# Patient Record
Sex: Female | Born: 1985 | Hispanic: Yes | Marital: Married | Smoking: Never smoker
Health system: Southern US, Community
[De-identification: ages and names within clinical notes are randomized; demographics above are authoritative.]

## PROBLEM LIST (undated history)

## (undated) DIAGNOSIS — E669 Obesity, unspecified: Secondary | ICD-10-CM

## (undated) DIAGNOSIS — I2542 Coronary artery dissection: Secondary | ICD-10-CM

## (undated) DIAGNOSIS — F32A Depression, unspecified: Secondary | ICD-10-CM

## (undated) DIAGNOSIS — F329 Major depressive disorder, single episode, unspecified: Secondary | ICD-10-CM

## (undated) DIAGNOSIS — I219 Acute myocardial infarction, unspecified: Secondary | ICD-10-CM

## (undated) DIAGNOSIS — K760 Fatty (change of) liver, not elsewhere classified: Secondary | ICD-10-CM

## (undated) DIAGNOSIS — E282 Polycystic ovarian syndrome: Secondary | ICD-10-CM

## (undated) DIAGNOSIS — E785 Hyperlipidemia, unspecified: Secondary | ICD-10-CM

## (undated) DIAGNOSIS — F411 Generalized anxiety disorder: Secondary | ICD-10-CM

## (undated) DIAGNOSIS — O1490 Unspecified pre-eclampsia, unspecified trimester: Secondary | ICD-10-CM

## (undated) DIAGNOSIS — Z8679 Personal history of other diseases of the circulatory system: Secondary | ICD-10-CM

## (undated) DIAGNOSIS — Z8759 Personal history of other complications of pregnancy, childbirth and the puerperium: Secondary | ICD-10-CM

## (undated) HISTORY — DX: Generalized anxiety disorder: F41.1

## (undated) HISTORY — PX: CHOLECYSTECTOMY: SHX55

## (undated) HISTORY — DX: Coronary artery dissection: I25.42

## (undated) HISTORY — DX: Hyperlipidemia, unspecified: E78.5

## (undated) HISTORY — DX: Fatty (change of) liver, not elsewhere classified: K76.0

## (undated) HISTORY — DX: Major depressive disorder, single episode, unspecified: F32.9

## (undated) HISTORY — PX: OTHER SURGICAL HISTORY: SHX169

## (undated) HISTORY — DX: Polycystic ovarian syndrome: E28.2

## (undated) HISTORY — DX: Obesity, unspecified: E66.9

## (undated) HISTORY — DX: Unspecified pre-eclampsia, unspecified trimester: O14.90

## (undated) HISTORY — DX: Acute myocardial infarction, unspecified: I21.9

## (undated) HISTORY — DX: Depression, unspecified: F32.A

---

## 2011-03-17 DIAGNOSIS — I219 Acute myocardial infarction, unspecified: Secondary | ICD-10-CM

## 2011-03-17 HISTORY — DX: Acute myocardial infarction, unspecified: I21.9

## 2011-03-17 HISTORY — PX: OTHER SURGICAL HISTORY: SHX169

## 2013-06-08 ENCOUNTER — Encounter: Payer: Self-pay | Admitting: Emergency Medicine

## 2013-06-08 ENCOUNTER — Emergency Department
Admission: EM | Admit: 2013-06-08 | Discharge: 2013-06-08 | Disposition: A | Discharge status: Other Acute Inpatient Hospital | Payer: BC Managed Care – PPO | Source: Home / Self Care | Attending: Family Medicine | Admitting: Family Medicine

## 2013-06-08 DIAGNOSIS — R1110 Vomiting, unspecified: Secondary | ICD-10-CM

## 2013-06-08 DIAGNOSIS — R9431 Abnormal electrocardiogram [ECG] [EKG]: Secondary | ICD-10-CM

## 2013-06-08 DIAGNOSIS — Z8679 Personal history of other diseases of the circulatory system: Secondary | ICD-10-CM

## 2013-06-08 DIAGNOSIS — R112 Nausea with vomiting, unspecified: Secondary | ICD-10-CM

## 2013-06-08 LAB — POCT INFLUENZA A/B
Influenza A, POC: NEGATIVE
Influenza B, POC: NEGATIVE

## 2013-06-08 NOTE — ED Notes (Signed)
Tuesday night started vomiting, chills, fever, headache

## 2013-06-08 NOTE — ED Provider Notes (Signed)
CSN: [id number]     Arrival date & time 06/08/13  1212 History   First MD Initiated Contact with Patient 06/08/13 1213     Chief Complaint  Patient presents with  . 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  On ASA daily  Sxs not similar to previous arterial dissection, which were stabbing chest pain with radiation to the back.  States that nausea is predominantly in am.  Emesis NBNB.  Was previously followed by cardiology in [place].  Has been in [place] for 2 years. Has not been seen by cards. No primary care.   History reviewed. No pertinent past medical history. Past Surgical History  Procedure Laterality Date  . Arterial disection    . Bare metal stent    . Cesarean section     No family history on file. History  Substance Use Topics  . Smoking status: Never Smoker   . Smokeless tobacco: Not on file  . Alcohol Use: No   OB History   Grav Para Term Preterm Abortions TAB SAB Ect Mult Living                 Review of Systems  All other systems reviewed and are negative.    Allergies  Review of patient's allergies indicates no known allergies.  Home Medications   Current Outpatient Rx  Name  Route  Sig  Dispense  Refill  . aspirin 81 MG tablet   Oral   Take 81 mg by mouth daily.          BP 134/93  Pulse 110  Temp(Src) 98.9 F (37.2 C) (Oral)  Ht 5\' 3"  (1.6 m)  Wt 224 lb (101.606 kg)  BMI 39.69 kg/m2  SpO2 97% Physical Exam  Constitutional: She is oriented to person, place, and time. She appears well-developed and well-nourished.  HENT:  Head: Normocephalic and atraumatic.  Right Ear: External ear normal.  Left Ear: External ear normal.  Eyes: Conjunctivae are normal. Pupils are equal, round, and reactive to light.  Neck: Normal range of motion.  Cardiovascular: Normal  rate and regular rhythm.   Pulmonary/Chest: Effort normal and breath sounds normal.  Abdominal: Soft.  Musculoskeletal: Normal range of motion.  Neurological: She is alert and oriented to person, place, and time.  Skin: Skin is warm.    ED Course  Procedures (including critical care time) Labs Review Labs Reviewed  POCT INFLUENZA A/B   Imaging Review No results found.  EKG: sinus tachycardia. Noted t wave inversions in anterior leads  MDM   1. Emesis   2. Nausea with vomiting   3. Nonspecific abnormal electrocardiogram (ECG) (EKG)   4. H/O arterial dissection    Suspect this is a viral process. However, given prior hx/o coronary arterial dissection with recurring nausea and abnormal EKG, it may be prudent for pt to be formally evaluated to rule out coronary source of sxs. I do not have an old EKG to compare with current. Distribution of abnormality is presumptively over previous coronary arterial dissection if this was on LAD.  Pt given full dose ASA and supplemental O2 Rapid flu negative  Will send pt to ER for further evaluation of sxs.  Discussed overall plan of care with pt and she is agreeable.  Shanda Howells, MD 06/08/13 1315

## 2013-06-08 NOTE — ED Notes (Signed)
Given 4/81mg  asprins 2liters oxygen Transported to hospital by EMS

## 2013-06-09 ENCOUNTER — Encounter: Payer: Self-pay | Admitting: Physician Assistant

## 2013-06-09 ENCOUNTER — Ambulatory Visit (INDEPENDENT_AMBULATORY_CARE_PROVIDER_SITE_OTHER): Payer: BC Managed Care – PPO | Admitting: Physician Assistant

## 2013-06-09 ENCOUNTER — Telehealth: Payer: Self-pay | Admitting: Physician Assistant

## 2013-06-09 VITALS — BP 129/79 | HR 104 | Ht 63.0 in | Wt 227.0 lb

## 2013-06-09 DIAGNOSIS — N390 Urinary tract infection, site not specified: Secondary | ICD-10-CM

## 2013-06-09 DIAGNOSIS — F3289 Other specified depressive episodes: Secondary | ICD-10-CM

## 2013-06-09 DIAGNOSIS — E876 Hypokalemia: Secondary | ICD-10-CM

## 2013-06-09 DIAGNOSIS — R748 Abnormal levels of other serum enzymes: Secondary | ICD-10-CM

## 2013-06-09 DIAGNOSIS — F411 Generalized anxiety disorder: Secondary | ICD-10-CM

## 2013-06-09 DIAGNOSIS — F32A Depression, unspecified: Secondary | ICD-10-CM

## 2013-06-09 DIAGNOSIS — F329 Major depressive disorder, single episode, unspecified: Secondary | ICD-10-CM

## 2013-06-09 LAB — HEPATIC FUNCTION PANEL
ALBUMIN: 3.7 g/dL (ref 3.5–5.2)
ALT: 69 U/L — AB (ref 0–35)
AST: 50 U/L — ABNORMAL HIGH (ref 0–37)
Alkaline Phosphatase: 69 U/L (ref 39–117)
Bilirubin, Direct: 0.5 mg/dL — ABNORMAL HIGH (ref 0.0–0.3)
Indirect Bilirubin: 1.6 mg/dL — ABNORMAL HIGH (ref 0.2–1.2)
TOTAL PROTEIN: 6.7 g/dL (ref 6.0–8.3)
Total Bilirubin: 2.1 mg/dL — ABNORMAL HIGH (ref 0.2–1.2)

## 2013-06-09 LAB — POTASSIUM: POTASSIUM: 3.5 meq/L (ref 3.5–5.3)

## 2013-06-09 MED ORDER — BUPROPION HCL ER (XL) 150 MG PO TB24: 150.0000 mg | ORAL_TABLET | Freq: Every day | ORAL | Status: DC

## 2013-06-09 NOTE — Telephone Encounter (Signed)
Done

## 2013-06-09 NOTE — Telephone Encounter (Signed)
Patient was just seen and need her pharmacy changed to [facility] on [address] not [facility] they do not accept her insurance. Thanks

## 2013-06-09 NOTE — Patient Instructions (Signed)
Will start wellbutrin daily.    Depression, Adult Depression refers to feeling sad, low, down in the dumps, blue, gloomy, or empty. In general, there are two kinds of depression: 1. Depression that we all experience from time to time because of upsetting life experiences, including the loss of a job or the ending of a relationship (normal sadness or normal grief). This kind of depression is considered normal, is short lived, and resolves within a few days to 2 weeks. (Depression experienced after the loss of a loved one is called bereavement. Bereavement often lasts longer than 2 weeks but normally gets better with time.) 2. Clinical depression, which lasts longer than normal sadness or normal grief or interferes with your ability to function at home, at work, and in school. It also interferes with your personal relationships. It affects almost every aspect of your life. Clinical depression is an illness. Symptoms of depression also can be caused by conditions other than normal sadness and grief or clinical depression. Examples of these conditions are listed as follows:  Physical illness Some physical illnesses, including underactive thyroid gland (hypothyroidism), severe anemia, specific types of cancer, diabetes, uncontrolled seizures, heart and lung problems, strokes, and chronic pain are commonly associated with symptoms of depression.  Side effects of some prescription medicine In some people, certain types of prescription medicine can cause symptoms of depression.  Substance abuse Abuse of alcohol and illicit drugs can cause symptoms of depression. SYMPTOMS Symptoms of normal sadness and normal grief include the following:  Feeling sad or crying for short periods of time.  Not caring about anything (apathy).  Difficulty sleeping or sleeping too much.  No longer able to enjoy the things you used to enjoy.  Desire to be by oneself all the time (social isolation).  Lack of energy or  motivation.  Difficulty concentrating or remembering.  Change in appetite or weight.  Restlessness or agitation. Symptoms of clinical depression include the same symptoms of normal sadness or normal grief and also the following symptoms:  Feeling sad or crying all the time.  Feelings of guilt or worthlessness.  Feelings of hopelessness or helplessness.  Thoughts of suicide or the desire to harm yourself (suicidal ideation).  Loss of touch with reality (psychotic symptoms). Seeing or hearing things that are not real (hallucinations) or having false beliefs about your life or the people around you (delusions and paranoia). DIAGNOSIS  The diagnosis of clinical depression usually is based on the severity and duration of the symptoms. Your caregiver also will ask you questions about your medical history and substance use to find out if physical illness, use of prescription medicine, or substance abuse is causing your depression. Your caregiver also may order blood tests. TREATMENT  Typically, normal sadness and normal grief do not require treatment. However, sometimes antidepressant medicine is prescribed for bereavement to ease the depressive symptoms until they resolve. The treatment for clinical depression depends on the severity of your symptoms but typically includes antidepressant medicine, counseling with a mental health professional, or a combination of both. Your caregiver will help to determine what treatment is best for you. Depression caused by physical illness usually goes away with appropriate medical treatment of the illness. If prescription medicine is causing depression, talk with your caregiver about stopping the medicine, decreasing the dose, or substituting another medicine. Depression caused by abuse of alcohol or illicit drugs abuse goes away with abstinence from these substances. Some adults need professional help in order to stop drinking or using drugs.  SEEK IMMEDIATE  CARE IF:  You have thoughts about hurting yourself or others.  You lose touch with reality (have psychotic symptoms).  You are taking medicine for depression and have a serious side effect. FOR MORE INFORMATION National Alliance on Mental Illness: www.nami.[employer]: https://carter.com/ Document Released: 02/28/2000 Document Revised: 09/01/2011 Document Reviewed: 06/01/2011 [facility] Patient Information 2014 [place].

## 2013-06-13 ENCOUNTER — Other Ambulatory Visit: Payer: Self-pay | Admitting: Physician Assistant

## 2013-06-13 DIAGNOSIS — O99345 Other mental disorders complicating the puerperium: Secondary | ICD-10-CM | POA: Insufficient documentation

## 2013-06-13 DIAGNOSIS — F329 Major depressive disorder, single episode, unspecified: Secondary | ICD-10-CM | POA: Insufficient documentation

## 2013-06-13 DIAGNOSIS — F53 Postpartum depression: Secondary | ICD-10-CM | POA: Insufficient documentation

## 2013-06-13 DIAGNOSIS — F32A Depression, unspecified: Secondary | ICD-10-CM | POA: Insufficient documentation

## 2013-06-13 DIAGNOSIS — F411 Generalized anxiety disorder: Secondary | ICD-10-CM | POA: Insufficient documentation

## 2013-06-13 DIAGNOSIS — R748 Abnormal levels of other serum enzymes: Secondary | ICD-10-CM

## 2013-06-13 NOTE — Progress Notes (Addendum)
   Subjective:    Patient ID: Carol Wright, female    DOB: 08-23-1985, 28 y.o.   MRN: [id number]  HPI. Pt is a 28 yo female who presents to the clinic to establish care. She is currently being treated for a UTI that she she went to hospital for on 06/08/13 after going to [facility]. EKG had some abnormalities and was sent to ER. She started Cephalaxin, phenergan, and norco and has started to feel much better. She was having a lot of back pain, nausea, and vomiting before treatment. Potassium was a little low and liver enzymes were mildly elevated.   She comes in to discuss ongoing anxiety and depression today. She feels very out of control. She is sleeping alot. She will go to bed at 8 but wake up at in the morning. She has no energy. She does not feel like she wants to get out of bed. She doesn't want to do laundry or any activities. She stays at home with her daughter all day. She is constantly worring about things. Denies any suicidal or homicidal thoughts. Tried antidepressants in the past but doesn't remember any progress but may not have stayed on long enough.   . Active Ambulatory Problems    Diagnosis Date Noted  . No Active Ambulatory Problems   Resolved Ambulatory Problems    Diagnosis Date Noted  . No Resolved Ambulatory Problems   Past Medical History  Diagnosis Date  . Myocardial infarction    . History   Social History  . Marital Status: Married    Spouse Name: N/A    Number of Children: N/A  . Years of Education: N/A   Occupational History  . Not on file.   Social History Main Topics  . Smoking status: Never Smoker   . Smokeless tobacco: Not on file  . Alcohol Use: No  . Drug Use: No  . Sexual Activity: Yes   Other Topics Concern  . Not on file   Social History Narrative  . No narrative on file   . Family History  Problem Relation Age of Onset  . Hypertension Mother   . Hypertension Maternal Grandmother   . Heart attack Maternal Grandfather       Review  of Systems  All other systems reviewed and are negative.       Objective:   Physical Exam  Constitutional: She is oriented to person, place, and time. She appears well-developed and well-nourished.  HENT:  Head: Normocephalic and atraumatic.  Right Ear: External ear normal.  Left Ear: External ear normal.  Nose: Nose normal.  Mouth/Throat: Oropharynx is clear and moist.  Eyes: Conjunctivae are normal.  Cardiovascular: Normal rate, regular rhythm and normal heart sounds.   Pulmonary/Chest: Effort normal and breath sounds normal. She has no wheezes.  No CVA tenderness.  Neurological: She is alert and oriented to person, place, and time.  Skin: Skin is dry.  Psychiatric:  Flat affect.          Assessment & Plan:  UTI- seems to be improving. Stay on keflex. Follow up in not improving. If not in pain do not use norco.  Low potassium- will recheck today.   Elevated liver enzymes- will recheck today.   Anxiety and depression- GAD-7 was 16 and PHQ-9 was 22. Would like to start treatment with wellbutrin. Discussed side effects and possibility of worsening depression/anxiety stop and call office. Follow up in 4-6 weeks.

## 2013-06-19 ENCOUNTER — Ambulatory Visit (INDEPENDENT_AMBULATORY_CARE_PROVIDER_SITE_OTHER): Payer: BC Managed Care – PPO

## 2013-06-19 DIAGNOSIS — R748 Abnormal levels of other serum enzymes: Secondary | ICD-10-CM

## 2013-06-20 ENCOUNTER — Encounter: Payer: Self-pay | Admitting: Physician Assistant

## 2013-06-20 ENCOUNTER — Encounter: Payer: Self-pay | Admitting: Nurse Practitioner

## 2013-06-20 ENCOUNTER — Ambulatory Visit (INDEPENDENT_AMBULATORY_CARE_PROVIDER_SITE_OTHER): Payer: BC Managed Care – PPO | Admitting: Nurse Practitioner

## 2013-06-20 VITALS — BP 133/94 | HR 94 | Resp 16 | Ht 63.0 in | Wt 224.0 lb

## 2013-06-20 DIAGNOSIS — Z1151 Encounter for screening for human papillomavirus (HPV): Secondary | ICD-10-CM

## 2013-06-20 DIAGNOSIS — Z0000 Encounter for general adult medical examination without abnormal findings: Secondary | ICD-10-CM

## 2013-06-20 DIAGNOSIS — I252 Old myocardial infarction: Secondary | ICD-10-CM

## 2013-06-20 DIAGNOSIS — Z124 Encounter for screening for malignant neoplasm of cervix: Secondary | ICD-10-CM

## 2013-06-20 DIAGNOSIS — Z01419 Encounter for gynecological examination (general) (routine) without abnormal findings: Secondary | ICD-10-CM

## 2013-06-20 DIAGNOSIS — K760 Fatty (change of) liver, not elsewhere classified: Secondary | ICD-10-CM | POA: Insufficient documentation

## 2013-06-20 DIAGNOSIS — E669 Obesity, unspecified: Secondary | ICD-10-CM | POA: Insufficient documentation

## 2013-06-20 LAB — HEPATITIS PANEL, ACUTE
HCV AB: NEGATIVE
Hep A IgM: NONREACTIVE
Hep B C IgM: NONREACTIVE
Hepatitis B Surface Ag: NEGATIVE

## 2013-06-20 NOTE — Progress Notes (Signed)
History:  Carol Wright.is a 28 y.o. G1P0101 who presents to [facility] clinic today for well woman exam. She has not had pap smear in 2 years, no abnormal's. She denies any issues with vagina and has same partner. She moved here from [place] and is just establishing care. She is using nothing for contraception and she is actively planning pregnancy. She has a history of cardiac dissection leading to MI when she was 5 months pregnant. She is unsure of vessel. She also had PIH with that pregnancy. She was recently placed on Wellbutrin for anxiety and depression.   The following portions of the patient's history were reviewed and updated as appropriate: allergies, current medications, past family history, past medical history, past social history, past surgical history and problem list.  Review of Systems:  Pertinent items are noted in HPI.  Objective:  Physical Exam BP 133/94  Pulse 94  Resp 16  Ht 5\' 3"  (1.6 m)  Wt 224 lb (101.606 kg)  BMI 39.69 kg/m2  LMP 06/11/2013 GENERAL: Well-developed, well-nourished female in no acute distress. Obese HEENT: Normocephalic, atraumatic.  NECK: Supple. Normal thyroid.  LUNGS: Normal rate. Clear to auscultation bilaterally.  HEART: Regular rate and rhythm with no adventitious sounds.  BREASTS: Symmetric in size. No masses, skin changes, nipple drainage, or lymphadenopathy. Second nipple on right breast ABDOMEN: Soft, nontender, nondistended. No organomegaly. Normal bowel sounds appreciated in all quadrants.  PELVIC: Normal external female genitalia. Vagina is pink and rugated.  Normal discharge. Normal cervix contour. Pap smear obtained. Uterus is normal in size. No adnexal mass or tenderness. Difficult to assess due to size EXTREMITIES: No cyanosis, clubbing, or edema, 2+ distal pulses.   Labs and Imaging US Abdomen Complete  06/19/2013   CLINICAL DATA:  Elevated LFT  EXAM: ULTRASOUND ABDOMEN COMPLETE  COMPARISON:  None.  FINDINGS: Gallbladder:   Gallbladder not visualized. Gallbladder may be contracted or absent.  Common bile duct:  Diameter: Not adequately visualized due to bowel gas.  Liver:  Diffusely increased echogenicity suggesting fatty infiltration. Liver is not well imaged.  IVC:  Limited  Pancreas:  Limited  Spleen:  Size and appearance within normal limits.  Right Kidney:  Length: 11.7 cm. Echogenicity within normal limits. No mass or hydronephrosis visualized.  Left Kidney:  Length: 11.9 cm. Echogenicity within normal limits. No mass or hydronephrosis visualized.  Abdominal aorta:  No aneurysm visualized.  Other findings:  Limited study due to bowel gas.  IMPRESSION: Gallbladder not visualized. It may be contracted or absent. Common bile duct nondilated.  Echogenic liver suggesting fatty infiltration.   Electronically Signed   By: Franchot Gallo M.D.   On: 06/19/2013 13:51    Assessment & Plan:  Assessment:  Well woman exam Obesity Contraception/ fertility Hx MI Anxiety/ depression  Plans:  Discussed waiting on conception until she is in better place physically and mentally/ she agreed Cardiac consult for establishment of care prior to pregnancy Advised counseling and that Wellbutrin is category C in pregnancy Start PNV Start exercise / weight loss Check TSH, CBC, CMET, Fasting lipid profile today/ will call her with results  Olegario Messier, NP 06/20/2013 9:52 AM

## 2013-06-20 NOTE — Patient Instructions (Signed)

## 2013-06-21 ENCOUNTER — Telehealth: Payer: Self-pay | Admitting: *Deleted

## 2013-06-21 LAB — COMPREHENSIVE METABOLIC PANEL
ALT: 60 U/L — AB (ref 0–35)
AST: 38 U/L — ABNORMAL HIGH (ref 0–37)
Albumin: 4.5 g/dL (ref 3.5–5.2)
Alkaline Phosphatase: 86 U/L (ref 39–117)
BILIRUBIN TOTAL: 1 mg/dL (ref 0.2–1.2)
BUN: 9 mg/dL (ref 6–23)
CO2: 24 meq/L (ref 19–32)
Calcium: 9.1 mg/dL (ref 8.4–10.5)
Chloride: 99 mEq/L (ref 96–112)
Creat: 0.74 mg/dL (ref 0.50–1.10)
Glucose, Bld: 103 mg/dL — ABNORMAL HIGH (ref 70–99)
Potassium: 3.8 mEq/L (ref 3.5–5.3)
Sodium: 132 mEq/L — ABNORMAL LOW (ref 135–145)
Total Protein: 7.4 g/dL (ref 6.0–8.3)

## 2013-06-21 LAB — CBC
HCT: 39.1 % (ref 36.0–46.0)
Hemoglobin: 13.6 g/dL (ref 12.0–15.0)
MCH: 28.7 pg (ref 26.0–34.0)
MCHC: 34.8 g/dL (ref 30.0–36.0)
MCV: 82.5 fL (ref 78.0–100.0)
PLATELETS: 309 10*3/uL (ref 150–400)
RBC: 4.74 MIL/uL (ref 3.87–5.11)
RDW: 14.3 % (ref 11.5–15.5)
WBC: 9.9 10*3/uL (ref 4.0–10.5)

## 2013-06-21 LAB — LIPID PANEL
CHOL/HDL RATIO: 4.9 ratio
Cholesterol: 172 mg/dL (ref 0–200)
HDL: 35 mg/dL — ABNORMAL LOW (ref >39)
LDL Cholesterol: 105 mg/dL — ABNORMAL HIGH (ref 0–99)
Triglycerides: 162 mg/dL — ABNORMAL HIGH (ref <150)
VLDL: 32 mg/dL (ref 0–40)

## 2013-06-21 LAB — TSH: TSH: 1.463 u[IU]/mL (ref 0.350–4.500)

## 2013-06-21 NOTE — Telephone Encounter (Signed)
Copy of labs mailed to pt's home address with instructions to f/u with her PCP to discuss elevated labs.

## 2013-07-10 ENCOUNTER — Encounter: Payer: Self-pay | Admitting: *Deleted

## 2013-07-10 ENCOUNTER — Encounter: Payer: Self-pay | Admitting: Cardiology

## 2013-07-12 ENCOUNTER — Encounter: Payer: Self-pay | Admitting: *Deleted

## 2013-07-12 ENCOUNTER — Ambulatory Visit (INDEPENDENT_AMBULATORY_CARE_PROVIDER_SITE_OTHER): Payer: BC Managed Care – PPO | Admitting: Cardiology

## 2013-07-12 ENCOUNTER — Encounter: Payer: Self-pay | Admitting: Cardiology

## 2013-07-12 VITALS — BP 130/84 | HR 88 | Ht 63.0 in | Wt 223.0 lb

## 2013-07-12 DIAGNOSIS — I2510 Atherosclerotic heart disease of native coronary artery without angina pectoris: Secondary | ICD-10-CM | POA: Insufficient documentation

## 2013-07-12 DIAGNOSIS — O99419 Diseases of the circulatory system complicating pregnancy, unspecified trimester: Secondary | ICD-10-CM

## 2013-07-12 DIAGNOSIS — I252 Old myocardial infarction: Secondary | ICD-10-CM

## 2013-07-12 NOTE — Assessment & Plan Note (Signed)
Patient suffered spontaneous coronary dissection during pregnancy. She had PCI. Plan to continue aspirin. I will obtain all records from [place] concerning her previous event. I will plan to repeat an echocardiogram to reassess LV function and need for ACE inhibitor or beta blocker. We discussed risk of subsequent pregnancy. At this point I do not think there is good information to guide [place] in this respect. I would be hesitant to recommend pregnancy given her history but I will review with colleagues.

## 2013-07-12 NOTE — Progress Notes (Signed)
     HPI: 28 year old female for evaluation of coronary artery disease. In 2013 when she was 5 months pregnant the patient suffered coronary dissection. She underwent PCI with a bare-metal stent by her report. Her LV function was initially reduced but improved. I do not have those records available. Since that time she has done well. She has mild dyspnea with more moderate activities. No orthopnea, PND, pedal edema, chest pain or syncope.  Current Outpatient Prescriptions  Medication Sig Dispense Refill  . aspirin 81 MG tablet Take 81 mg by mouth daily.      Marland Kitchen buPROPion (WELLBUTRIN XL) 150 MG 24 hr tablet Take 1 tablet (150 mg total) by mouth daily.  30 tablet  1   No current facility-administered medications for this visit.    No Known Allergies  Past Medical History  Diagnosis Date  . Myocardial infarction 2013  . Pre-eclampsia   . Obesity (BMI 30-39.9)   . Hepatic steatosis     Via ultrasound.    . Generalized anxiety disorder   . Depression   . Hyperlipidemia     Past Surgical History  Procedure Laterality Date  . Arterial disection  2013  . Bare metal stent  2013  . Cesarean section  2013    History   Social History  . Marital Status: Married    Spouse Name: N/A    Number of Children: 1  . Years of Education: N/A   Occupational History  .      Stay at home mother   Social History Main Topics  . Smoking status: Never Smoker   . Smokeless tobacco: Never Used  . Alcohol Use: No  . Drug Use: No  . Sexual Activity: Yes   Other Topics Concern  . Not on file   Social History Narrative  . No narrative on file    Family History  Problem Relation Age of Onset  . Hypertension Mother   . Hypertension Maternal Grandmother   . Heart attack Maternal Grandfather     ROS: no fevers or chills, productive cough, hemoptysis, dysphasia, odynophagia, melena, hematochezia, dysuria, hematuria, rash, seizure activity, orthopnea, PND, pedal edema, claudication. Remaining  systems are negative.  Physical Exam:   Blood pressure 130/84, pulse 88, height 5\' 3"  (1.6 m), weight 223 lb (101.152 kg), last menstrual period 06/11/2013.  General:  Well developed/well nourished in NAD Skin warm/dry Patient not depressed No peripheral clubbing Back-normal HEENT-normal/normal eyelids Neck supple/normal carotid upstroke bilaterally; no bruits; no JVD; no thyromegaly chest - CTA/ normal expansion CV - RRR/normal S1 and S2; no murmurs, rubs or gallops;  PMI nondisplaced Abdomen -NT/ND, no HSM, no mass, + bowel sounds, no bruit 2+ femoral pulses, no bruits Ext-no edema, chords, 2+ DP Neuro-grossly nonfocal  ECG NSR with anterior MI, Incomplete right bundle branch block, left posterior fascicular block

## 2013-07-12 NOTE — Patient Instructions (Signed)
Your physician wants you to follow-up in: [place] will receive a reminder letter in the mail two months in advance. If you don't receive a letter, please call our office to schedule the follow-up appointment.   Your physician has requested that you have an echocardiogram. Echocardiography is a painless test that uses sound waves to create images of your heart. It provides your doctor with information about the size and shape of your heart and how well your heart's chambers and valves are working. This procedure takes approximately one hour. There are no restrictions for this procedure.IN THE [place] OFFICE

## 2013-07-13 ENCOUNTER — Telehealth: Payer: Self-pay | Admitting: Cardiology

## 2013-07-13 NOTE — Telephone Encounter (Signed)
ROI faxed to [facility] Medical Records @ [phone]

## 2013-07-31 ENCOUNTER — Other Ambulatory Visit (HOSPITAL_COMMUNITY): Payer: BC Managed Care – PPO

## 2013-08-01 ENCOUNTER — Encounter (HOSPITAL_COMMUNITY): Payer: Self-pay | Admitting: Cardiology

## 2013-08-10 ENCOUNTER — Other Ambulatory Visit: Payer: Self-pay | Admitting: Physician Assistant

## 2013-08-28 ENCOUNTER — Ambulatory Visit: Payer: BC Managed Care – PPO | Admitting: Physician Assistant

## 2013-08-28 ENCOUNTER — Ambulatory Visit (INDEPENDENT_AMBULATORY_CARE_PROVIDER_SITE_OTHER): Payer: BC Managed Care – PPO | Admitting: Physician Assistant

## 2013-08-28 ENCOUNTER — Encounter: Payer: Self-pay | Admitting: Physician Assistant

## 2013-08-28 VITALS — BP 132/80 | HR 88 | Ht 63.0 in | Wt 227.0 lb

## 2013-08-28 DIAGNOSIS — F411 Generalized anxiety disorder: Secondary | ICD-10-CM

## 2013-08-28 DIAGNOSIS — R5383 Other fatigue: Secondary | ICD-10-CM

## 2013-08-28 DIAGNOSIS — F3289 Other specified depressive episodes: Secondary | ICD-10-CM

## 2013-08-28 DIAGNOSIS — F329 Major depressive disorder, single episode, unspecified: Secondary | ICD-10-CM

## 2013-08-28 DIAGNOSIS — F32A Depression, unspecified: Secondary | ICD-10-CM

## 2013-08-28 MED ORDER — BUPROPION HCL ER (XL) 300 MG PO TB24: 300.0000 mg | ORAL_TABLET | Freq: Every day | ORAL | Status: DC

## 2013-08-28 MED ORDER — BUPROPION HCL ER (XL) 150 MG PO TB24: ORAL_TABLET | ORAL | Status: DC

## 2013-08-28 NOTE — Progress Notes (Signed)
   Subjective:    Patient ID: Carol Wright, female    DOB: 1985/12/29, 28 y.o.   MRN: [id number]  HPI Patient is a 28 year old female who presents to the clinic to followup on depression and anxiety. She reports she is doing better but still not what she would feel as control. She denies having any suicidal or homicidal thoughts. She still has several days over the past 2 weeks that she's had little interest or pleasure in doing things, feeling down or depressed, having little energy, trouble staying away. She often will find herself worrying about many different things and feeling nervous or anxious. She is able to do laundry now and get housework, without filling to anxious. She does keep her daughter at home.   Review of Systems  All other systems reviewed and are negative.      Objective:   Physical Exam  Constitutional: She is oriented to person, place, and time. She appears well-developed and well-nourished.  HENT:  Head: Normocephalic and atraumatic.  Cardiovascular: Normal rate, regular rhythm and normal heart sounds.   Pulmonary/Chest: Effort normal and breath sounds normal.  Neurological: She is alert and oriented to person, place, and time.  Skin: Skin is dry.  Psychiatric: She has a normal mood and affect. Her behavior is normal.          Assessment & Plan:  GAD/Depression-GAD-7 was 9. PHQ-9 was 10. They have decreased by about 50 percent. I believe the patient is doing better than she even notices. The numbers have decreased substantially Will increase wellbutrin to 300mg  daily. Follow up in 2 months. Continue to work on exercise. Consider therapy. Call if would like referral to counselor. Will check cmp today.

## 2013-08-29 LAB — COMPLETE METABOLIC PANEL WITH GFR
ALK PHOS: 82 U/L (ref 39–117)
ALT: 57 U/L — ABNORMAL HIGH (ref 0–35)
AST: 39 U/L — ABNORMAL HIGH (ref 0–37)
Albumin: 4.5 g/dL (ref 3.5–5.2)
BUN: 10 mg/dL (ref 6–23)
CO2: 25 mEq/L (ref 19–32)
CREATININE: 0.63 mg/dL (ref 0.50–1.10)
Calcium: 9.1 mg/dL (ref 8.4–10.5)
Chloride: 105 mEq/L (ref 96–112)
GLUCOSE: 106 mg/dL — AB (ref 70–99)
Potassium: 4 mEq/L (ref 3.5–5.3)
SODIUM: 139 meq/L (ref 135–145)
TOTAL PROTEIN: 7 g/dL (ref 6.0–8.3)
Total Bilirubin: 1 mg/dL (ref 0.2–1.2)

## 2013-08-29 LAB — CBC
HCT: 40.6 % (ref 36.0–46.0)
Hemoglobin: 13.4 g/dL (ref 12.0–15.0)
MCH: 28.9 pg (ref 26.0–34.0)
MCHC: 33 g/dL (ref 30.0–36.0)
MCV: 87.7 fL (ref 78.0–100.0)
PLATELETS: 222 10*3/uL (ref 150–400)
RBC: 4.63 MIL/uL (ref 3.87–5.11)
RDW: 14.4 % (ref 11.5–15.5)
WBC: 9 10*3/uL (ref 4.0–10.5)

## 2013-08-29 LAB — FERRITIN: FERRITIN: 139 ng/mL (ref 10–291)

## 2013-08-29 LAB — VITAMIN B12: Vitamin B-12: 401 pg/mL (ref 211–911)

## 2013-08-29 LAB — VITAMIN D 25 HYDROXY (VIT D DEFICIENCY, FRACTURES): Vit D, 25-Hydroxy: 31 ng/mL (ref 30–89)

## 2013-12-09 ENCOUNTER — Other Ambulatory Visit: Payer: Self-pay | Admitting: Physician Assistant

## 2014-01-15 ENCOUNTER — Encounter: Payer: Self-pay | Admitting: Physician Assistant

## 2014-06-20 ENCOUNTER — Ambulatory Visit (INDEPENDENT_AMBULATORY_CARE_PROVIDER_SITE_OTHER): Payer: 59 | Admitting: Physician Assistant

## 2014-06-20 ENCOUNTER — Encounter: Payer: Self-pay | Admitting: Physician Assistant

## 2014-06-20 VITALS — BP 148/96 | HR 86 | Wt 229.0 lb

## 2014-06-20 DIAGNOSIS — H1033 Unspecified acute conjunctivitis, bilateral: Secondary | ICD-10-CM

## 2014-06-20 DIAGNOSIS — H10023 Other mucopurulent conjunctivitis, bilateral: Secondary | ICD-10-CM | POA: Diagnosis not present

## 2014-06-20 MED ORDER — POLYMYXIN B-TRIMETHOPRIM 10000-0.1 UNIT/ML-% OP SOLN: 1.0000 [drp] | OPHTHALMIC | Status: DC

## 2014-06-20 NOTE — Progress Notes (Signed)
   Subjective:    Patient ID: Carol Wright, female    DOB: 03-26-1985, 29 y.o.   MRN: [id number]  HPI Pt presents to the clinic with left eye redness, thick discharge and pain for 1 day. Denies itching. Woke up this am and right eye has same symptoms. Just getting over a cold. Feeling better from that. Not taking anything to make better. Feels like getting worse. No sinus pressure, ear pain or ST. No cough SOB or wheezing.    Review of Systems  All other systems reviewed and are negative.      Objective:   Physical Exam  Constitutional: She is oriented to person, place, and time. She appears well-developed and well-nourished.  HENT:  Head: Normocephalic and atraumatic.  Right Ear: External ear normal.  Left Ear: External ear normal.  Nose: Nose normal.  Mouth/Throat: Oropharynx is clear and moist. No oropharyngeal exudate.  Eyes:  Bilateral injected conjunctiva with green thick purulent discharge and crusted discharge on eyelashes.   Neck: Normal range of motion. Neck supple.  Cardiovascular: Normal rate, regular rhythm and normal heart sounds.   Pulmonary/Chest: Effort normal and breath sounds normal. She has no wheezes.  Lymphadenopathy:    She has no cervical adenopathy.  Neurological: She is alert and oriented to person, place, and time.  Skin: Skin is dry.  Psychiatric: She has a normal mood and affect. Her behavior is normal.          Assessment & Plan:  Bacterial conjunctivitis, bilaterally- treated with polytrim for 7 days. Discussed very contagious. Cool compresses. Ibuprofen and tylenol for discomfort. HO given. Get rid of masscara and or eye make up. Throw away any contacts.

## 2014-06-20 NOTE — Patient Instructions (Signed)

## 2014-06-22 ENCOUNTER — Encounter: Payer: Self-pay | Admitting: Physician Assistant

## 2014-06-22 ENCOUNTER — Ambulatory Visit (INDEPENDENT_AMBULATORY_CARE_PROVIDER_SITE_OTHER): Payer: 59 | Admitting: Physician Assistant

## 2014-06-22 VITALS — BP 145/100 | HR 94 | Temp 98.0°F | Wt 228.0 lb

## 2014-06-22 DIAGNOSIS — R030 Elevated blood-pressure reading, without diagnosis of hypertension: Secondary | ICD-10-CM | POA: Diagnosis not present

## 2014-06-22 DIAGNOSIS — IMO0001 Reserved for inherently not codable concepts without codable children: Secondary | ICD-10-CM

## 2014-06-22 DIAGNOSIS — J069 Acute upper respiratory infection, unspecified: Secondary | ICD-10-CM

## 2014-06-22 MED ORDER — METHYLPREDNISOLONE SODIUM SUCC 125 MG IJ SOLR
125.0000 mg | Freq: Once | INTRAMUSCULAR | Status: AC
  Administered 2014-06-22: 125 mg via INTRAVENOUS

## 2014-06-22 MED ORDER — AMOXICILLIN-POT CLAVULANATE 875-125 MG PO TABS: 1.0000 | ORAL_TABLET | Freq: Two times a day (BID) | ORAL | Status: DC

## 2014-06-22 NOTE — Progress Notes (Signed)
   Subjective:    Patient ID: Ridhima Golberg, female    DOB: Aug 06, 1985, 29 y.o.   MRN: [id number]  HPI Pt was seen on 06/20/14 for bacterial conjunctivitis. She now has right ear pain, lost voice, sinus pressure, ST. She denies cough, SOB or wheezing. She is not taking anything to make better. No fever, body aches, or chills.    Review of Systems  All other systems reviewed and are negative.      Objective:   Physical Exam  Constitutional: She is oriented to person, place, and time. She appears well-developed and well-nourished.  HENT:  Head: Normocephalic and atraumatic.  Left TM clear.  Right TM dull, erythematous with no blood or pus.   Tenderness over maxillary sinuses.   Bilateral nasal turbinates red and swollen.   Oropharynx is erythematous with some small petechia. No swollen tonsils or exudate.   Eyes: Conjunctivae are normal. Right eye exhibits no discharge. Left eye exhibits no discharge.  Neck: Normal range of motion. Neck supple.  Cardiovascular: Normal rate, regular rhythm and normal heart sounds.   Pulmonary/Chest: Effort normal and breath sounds normal.  Lymphadenopathy:    She has no cervical adenopathy.  Neurological: She is alert and oriented to person, place, and time.  Skin: Skin is dry.  Psychiatric: She has a normal mood and affect. Her behavior is normal.          Assessment & Plan:  Acute upper respiratory infection- bacterial conjunctivits resolved. augmentin for 10 days given. Gargle with salt water. Ibuprofen for pain. Solumedrol 125mg  IM given today.   Elevated blood pressure- last 2 visit BP elevated. Hx of MI. Recheck in 2 weeks or once feeling better check at pharmacy to make sure coming back down. Want to keep under 140/90 with hx. Certainly elevation could be due to sickness.

## 2014-07-17 ENCOUNTER — Ambulatory Visit (INDEPENDENT_AMBULATORY_CARE_PROVIDER_SITE_OTHER): Payer: 59 | Admitting: Sports Medicine

## 2014-07-17 ENCOUNTER — Encounter: Payer: Self-pay | Admitting: Sports Medicine

## 2014-07-17 VITALS — BP 133/92 | HR 87 | Temp 98.3°F | Ht 63.0 in | Wt 230.0 lb

## 2014-07-17 DIAGNOSIS — J029 Acute pharyngitis, unspecified: Secondary | ICD-10-CM | POA: Diagnosis not present

## 2014-07-17 LAB — POCT RAPID STREP A (OFFICE): Rapid Strep A Screen: NEGATIVE

## 2014-07-17 MED ORDER — FLUTICASONE PROPIONATE 50 MCG/ACT NA SUSP: NASAL | Status: DC

## 2014-07-17 NOTE — Assessment & Plan Note (Signed)
Present for approximately 6 weeks now, examination is very benign. Rapid strep test is negative.  This likely represent postnasal drip syndrome. She can discontinue all other medications and start fluticasone intranasal twice a day. Next line return in 4 weeks to see how things are going.

## 2014-07-17 NOTE — Progress Notes (Signed)
  Subjective:    CC: Persistent sore throat  HPI: This is a pleasant 29 year old female, for the past 6 weeks she's had a sore throat, she was given antibiotics by her primary care provider, unfortunately she's continued to have symptoms, we minimal runny nose, no cough, no constitutional symptoms, minimal itchy eyes. No symptoms of laryngeal differential reflux, without throat clearing, or sour brash. Symptoms are not worse at night. Mild, persistent.  Past medical history, Surgical history, Family history not pertinant except as noted below, Social history, Allergies, and medications have been entered into the medical record, reviewed, and no changes needed.   Review of Systems: No fevers, chills, night sweats, weight loss, chest pain, or shortness of breath.   Objective:    General: Well Developed, well nourished, and in no acute distress.  Neuro: Alert and oriented x3, extra-ocular muscles intact, sensation grossly intact.  HEENT: Normocephalic, atraumatic, pupils equal round reactive to light, neck supple, no masses, no lymphadenopathy, thyroid nonpalpable. Oropharynx shows minimally erythematous posterior, nasal turbinates are slightly boggy, and external ear canals are unremarkable. Skin: Warm and dry, no rashes. Cardiac: Regular rate and rhythm, no murmurs rubs or gallops, no lower extremity edema.  Respiratory: Clear to auscultation bilaterally. Not using accessory muscles, speaking in full sentences.  Rapid strep is negative.  Impression and Recommendations:

## 2015-09-30 ENCOUNTER — Ambulatory Visit (INDEPENDENT_AMBULATORY_CARE_PROVIDER_SITE_OTHER): Payer: BLUE CROSS/BLUE SHIELD | Admitting: Obstetrics & Gynecology

## 2015-09-30 ENCOUNTER — Encounter: Payer: Self-pay | Admitting: Obstetrics & Gynecology

## 2015-09-30 VITALS — BP 154/92 | HR 86 | Resp 16 | Ht 63.0 in | Wt 236.0 lb

## 2015-09-30 DIAGNOSIS — I2541 Coronary artery aneurysm: Secondary | ICD-10-CM

## 2015-09-30 DIAGNOSIS — Z124 Encounter for screening for malignant neoplasm of cervix: Secondary | ICD-10-CM

## 2015-09-30 DIAGNOSIS — Z01419 Encounter for gynecological examination (general) (routine) without abnormal findings: Secondary | ICD-10-CM

## 2015-09-30 DIAGNOSIS — Z1151 Encounter for screening for human papillomavirus (HPV): Secondary | ICD-10-CM | POA: Diagnosis not present

## 2015-09-30 NOTE — Addendum Note (Signed)
Addended by: Asencion Islam on: 09/30/2015 02:56 PM   Modules accepted: Orders

## 2015-09-30 NOTE — Progress Notes (Signed)
  Subjective:     Carol Wright is a 29 y.o. female and is here for a comprehensive physical exam. The patient reports bleeding for 3 days after intercourse..  NO other irregular bleeding.  Has menses once a month.  Wants to conceive.  Social History   Social History  . Marital Status: Married    Spouse Name: N/A  . Number of Children: 1  . Years of Education: N/A   Occupational History  .      Stay at home mother   Social History Main Topics  . Smoking status: Never Smoker   . Smokeless tobacco: Never Used  . Alcohol Use: No  . Drug Use: No  . Sexual Activity: Yes    Birth Control/ Protection: None   Other Topics Concern  . Not on file   Social History Narrative   Health Maintenance  Topic Date Due  . HIV Screening  07/24/1998  . TETANUS/TDAP  07/24/2002  . INFLUENZA VACCINE  10/15/2015  . PAP SMEAR  06/20/2016    The following portions of the patient's history were reviewed and updated as appropriate: allergies, current medications, past family history, past medical history, past social history, past surgical history and problem list.  Review of Systems Pertinent items noted in HPI and remainder of comprehensive ROS otherwise negative.   Objective:   Filed Vitals:   09/30/15 1316  BP: 154/92  Pulse: 86  Resp: 16  Height: 5\' 3"  (1.6 m)  Weight: 236 lb (107.049 kg)   Vitals:  WNL General appearance: alert, cooperative and no distress  HEENT: Normocephalic, without obvious abnormality, atraumatic Eyes: negative Throat: lips, mucosa, and tongue normal; teeth and gums normal  Respiratory: Clear to auscultation bilaterally  CV: Regular rate and rhythm  Breasts:  Normal appearance, no masses or tenderness, no nipple retraction or dimpling  GI: Soft, non-tender; bowel sounds normal; no masses,  no organomegaly  GU: External Genitalia:  Tanner V, no lesion Urethra:  No prolapse   Vagina: Pink, normal rugae, no blood or discharge  Cervix: No CMT, small  ectropion that bleeds with pap smear  Uterus:  Normal size and contour, non tender  Adnexa: Normal, no masses, non tender  Musculoskeletal: No edema, redness or tenderness in the calves or thighs  Skin: Acanthosis nigricans on neck, +hirsuitism on abdomen  Lymphatic: Axillary adenopathy: none    Psychiatric: Normal mood and behavior       Assessment:    Healthy female exam. CAD needing cardiology Cervical ectropion    Plan:    1-Referral to Cards for history of CAD and HTN today (pt states that when seh takes BP at home, it is 130s/80s).  Specifically sent to Dr. Percival Spanish.  She did not like prior cardiologist and has been avoiding care.   2-MFM preconceptual counseling 3-CBC, CMP, Hgb A1c, TSH 4-Silver nitrate to cervical ectropion.  Can use cryo if post coital bleeding continues. See After Visit Summary for Counseling Recommendations

## 2015-10-01 LAB — CBC
HEMATOCRIT: 42.7 % (ref 35.0–45.0)
Hemoglobin: 14.3 g/dL (ref 11.7–15.5)
MCH: 28.8 pg (ref 27.0–33.0)
MCHC: 33.5 g/dL (ref 32.0–36.0)
MCV: 85.9 fL (ref 80.0–100.0)
MPV: 11.2 fL (ref 7.5–12.5)
PLATELETS: 230 10*3/uL (ref 140–400)
RBC: 4.97 MIL/uL (ref 3.80–5.10)
RDW: 14.6 % (ref 11.0–15.0)
WBC: 10.8 10*3/uL (ref 3.8–10.8)

## 2015-10-01 LAB — COMPREHENSIVE METABOLIC PANEL
ALT: 55 U/L — ABNORMAL HIGH (ref 6–29)
AST: 37 U/L — AB (ref 10–30)
Albumin: 4.5 g/dL (ref 3.6–5.1)
Alkaline Phosphatase: 68 U/L (ref 33–115)
BILIRUBIN TOTAL: 1.8 mg/dL — AB (ref 0.2–1.2)
BUN: 8 mg/dL (ref 7–25)
CALCIUM: 8.9 mg/dL (ref 8.6–10.2)
CO2: 26 mmol/L (ref 20–31)
Chloride: 103 mmol/L (ref 98–110)
Creat: 0.6 mg/dL (ref 0.50–1.10)
Glucose, Bld: 83 mg/dL (ref 65–99)
POTASSIUM: 3.8 mmol/L (ref 3.5–5.3)
Sodium: 138 mmol/L (ref 135–146)
Total Protein: 7.6 g/dL (ref 6.1–8.1)

## 2015-10-01 LAB — TSH: TSH: 1.79 mIU/L

## 2015-10-01 LAB — HEMOGLOBIN A1C
Hgb A1c MFr Bld: 5.2 % (ref <5.7)
Mean Plasma Glucose: 103 mg/dL

## 2015-10-02 LAB — CYTOLOGY - PAP

## 2015-10-04 ENCOUNTER — Ambulatory Visit (HOSPITAL_COMMUNITY)
Admission: RE | Admit: 2015-10-04 | Discharge: 2015-10-04 | Disposition: A | Discharge status: Home / Self Care | Payer: BLUE CROSS/BLUE SHIELD | Source: Ambulatory Visit | Attending: Obstetrics & Gynecology | Admitting: Obstetrics & Gynecology

## 2015-10-04 ENCOUNTER — Encounter (HOSPITAL_COMMUNITY): Payer: Self-pay

## 2015-10-04 DIAGNOSIS — Z7982 Long term (current) use of aspirin: Secondary | ICD-10-CM | POA: Diagnosis not present

## 2015-10-04 DIAGNOSIS — Z3169 Encounter for other general counseling and advice on procreation: Secondary | ICD-10-CM | POA: Diagnosis not present

## 2015-10-04 DIAGNOSIS — Z6830 Body mass index (BMI) 30.0-30.9, adult: Secondary | ICD-10-CM | POA: Insufficient documentation

## 2015-10-04 DIAGNOSIS — Z8679 Personal history of other diseases of the circulatory system: Secondary | ICD-10-CM

## 2015-10-04 DIAGNOSIS — I252 Old myocardial infarction: Secondary | ICD-10-CM | POA: Diagnosis not present

## 2015-10-04 DIAGNOSIS — Z98891 History of uterine scar from previous surgery: Secondary | ICD-10-CM

## 2015-10-04 DIAGNOSIS — I10 Essential (primary) hypertension: Secondary | ICD-10-CM | POA: Diagnosis not present

## 2015-10-04 DIAGNOSIS — E669 Obesity, unspecified: Secondary | ICD-10-CM | POA: Insufficient documentation

## 2015-10-04 DIAGNOSIS — K760 Fatty (change of) liver, not elsewhere classified: Secondary | ICD-10-CM | POA: Diagnosis not present

## 2015-10-04 HISTORY — DX: Personal history of other diseases of the circulatory system: Z86.79

## 2015-10-04 NOTE — ED Notes (Signed)
Pt BP 142/102, pulse 86, weight 240.4.  Pt in with Dr. Lawerance Cruel for preconception counseling.

## 2015-10-04 NOTE — Progress Notes (Signed)
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 Myocardial infarction ([place]) 2013  . Pre-eclampsia   . Obesity (BMI 30-39.9)   . Hepatic steatosis     Via ultrasound.    . Generalized anxiety disorder   . Depression   . Hyperlipidemia   . H/O arterial dissection    Past Surgical History  Procedure Laterality Date  . Arterial disection  2013  . Bare metal stent  2013  . Cesarean section  2013   OB HX #: 1, Date: None, Sex: None, Weight: None, GA: None, Delivery: None, Apgar1: None, Apgar5: None, Living: None, Comments: None  Social History   Social History  . Marital Status: Married    Spouse Name: N/A  . Number of Children: 1  . Years of Education: N/A   Occupational History  .      Stay at home mother   Social History Main Topics  . Smoking status: Never Smoker   . Smokeless tobacco: Never Used  . Alcohol Use: No  . Drug Use: No  . Sexual Activity: Yes    Birth Control/ Protection: None   Other Topics Concern  . Not on file   Social History Narrative   No Known Allergies  Family History  Problem Relation Age of Onset  . Hypertension Mother   . Hypertension Maternal Grandmother   . Heart attack Maternal Grandfather     Impression / Recommendations #1 Preconception counseling - Discussed routine preconception issues. She is aware of the need and implications of weight loss for pregnancy outcome. She is currently taking supplemental folic acid for the last year. They have been attempting pregnancy for the last 2 years and have had no spontaneous conception. She notes that most of her periods are regularly although she has some skipped cycles. They may desire an infertility work-up with [facility] should they formally desire more active movement towards attaining pregnancy.  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 She was evaluated for pulmonary embolism with first lower extremity dopplers which were negative and then had the diagnosis made on echocardiogram?angiogram?  She had a metal stent placed at that time and was started on daily low dose aspirin, plavix and also topral. Plavix was stopped the week prior to delivery when started to develop preeclampsia symptoms.  - Limited published literature about SCAD in pregnancy. There is a single report of repeat SCAD in a subsequent pregnancy published in 2017. The questions is how many folks who have had SCAD attempt a second pregnancy.  - I am getting a copy of a draft of a case series of SCAD in pregnancy that will soon be published from my former colleagues at [facility] and will contact the couple when I get the information.  #3 Chronic hypertension - She notes she gets "white coat hypertension" and is not currently on any medications for blood pressure control. - She states that her blood pressures she checks outside of medical visits is in the 130/80s range and realizes that she has borderline hypertension.  - Weight loss prior to pregnancy and maintaining a healthy weight gain during pregnancy is encouraged to help with her currently blood pressure ranges. -  Would get baseline 24-hour urine and preeclampsia labs. - Early ultrasound for firm obstetric dating, aneuploidy screening as desired, fetal anatomic survey at ~18-[redacted] weeks gestation, serial ultrasounds for evaluation of fetal growth starting ~[redacted] weeks gestation, daily maternal assessment of fetal movement started at ~[redacted] weeks gestation with immediate provider contact for decreased or no fetal movement, serial antenatal testing starting ~[redacted] weeks gestation or as clinically indicated. Consider induction at ~37 weeks given history of SCAD.  #4  History of preeclampsia - We discussed the symptoms, fetal/neonatal and maternal implications of recurrence based on the timing of onset and severity in subsequent pregnancy.  - She is currently on a ASA 81 mg daily and should continue this in pregnancy #5 History of cesarean delivery - The couple had been told they could undergo subsequent TOL/VBAC. Would verity with operative report from [place] for uterine scar location and possible extension during surgery.  #6 History of depression - Depression screen today was negative - Follow closely during the pregnancy and particularly during the peripartum period.   Questions appear answered to the couples satisfaction. Precautions for the above given. Spent >45 minutes in face to face counseling and records review.

## 2015-10-07 ENCOUNTER — Telehealth: Payer: Self-pay | Admitting: *Deleted

## 2015-10-07 NOTE — Telephone Encounter (Signed)
-----   Message from Guss Bunde, MD sent at 10/06/2015  5:33 AM EDT ----- Bilirubin and LFTs are elevated.  Pt needs f/u with her PCP.  RN to call patient with results.

## 2015-10-07 NOTE — Telephone Encounter (Signed)
LM on voicemail concerning her elevated LFT's and Bilirubin.  Instructed pt to call Iran Planas her PCP to follow up on the abnormal labs

## 2015-10-09 ENCOUNTER — Telehealth: Payer: Self-pay | Admitting: Cardiology

## 2015-10-09 NOTE — Telephone Encounter (Signed)
Ok with me Carol Wright  

## 2015-10-09 NOTE — Telephone Encounter (Signed)
Routed back to scheduler/scheduling pool

## 2015-10-09 NOTE — Telephone Encounter (Signed)
Last MD visit 06/2013  Routed to Dr. Stanford Breed & Dr. Percival Spanish to comment on provider change

## 2015-10-09 NOTE — Telephone Encounter (Signed)
OK 

## 2015-10-09 NOTE — Telephone Encounter (Signed)
New message         Dr. Roseanne Kaufman office was calling to get a appointment to see Dr. Percival Spanish instead of Dr. Stanford Breed for the pt to be seen ,I explained we have a policy to get the approval from both MD's before we can make the appointment.

## 2015-11-11 ENCOUNTER — Encounter: Payer: Self-pay | Admitting: Cardiology

## 2015-11-24 NOTE — Progress Notes (Signed)
Cardiology Office Note   Date:  11/25/2015   ID:  Carol Wright, DOB 1985-10-30, MRN [id number]  PCP:  Iran Planas, PA-C  Cardiologist:   Minus Breeding, MD   Chief Complaint  Patient presents with  . SCAD      History of Present Illness:  Carol Wright is a 30 y.o. female who presents for evaluation of coronary artery disease. In 2013 when she was 5 months pregnant the patient suffered coronary dissection. She underwent PCI with a bare-metal stent by her report. Her LV function was initially reduced but improved by report.  She was seen in the past by another partner in our group but has not been back for awhile.    This is our first visit .  She's not taking care of her 62-year-old daughter. She does very well. She's not exercising but she does her routine chores of daily living. The patient denies any new symptoms such as chest discomfort, neck or arm discomfort. There has been no new shortness of breath, PND or orthopnea. There have been no reported palpitations, presyncope or syncope.   Past Medical History:  Diagnosis Date  . Depression   . Generalized anxiety disorder   . H/O arterial dissection   . Hepatic steatosis    Via ultrasound.    . Hyperlipidemia   . Myocardial infarction ([place]) 2013  . Obesity (BMI 30-39.9)   . Pre-eclampsia     Past Surgical History:  Procedure Laterality Date  . Arterial disection  2013  . Bare metal stent  2013  . CESAREAN SECTION  2013     Current Outpatient Prescriptions  Medication Sig Dispense Refill  . aspirin 81 MG tablet Take 81 mg by mouth daily.    . cetirizine (ZYRTEC) 10 MG tablet Take 10 mg by mouth daily. Reported on 10/04/2015    . fluticasone (FLONASE) 50 MCG/ACT nasal spray One spray in each nostril twice a day, use left hand for right nostril, and right hand for left nostril. 48 g 3  . FOLIC ACID PO Take by mouth.     No current facility-administered medications for this visit.     Allergies:   Review of  patient's allergies indicates no known allergies.    ROS:  Please see the history of present illness.   Otherwise, review of systems are positive for none.   All other systems are reviewed and negative.    PHYSICAL EXAM: VS:  BP (!) 134/102   Pulse 89   Ht 5\' 3"  (1.6 m)   Wt 240 lb (108.9 kg)   BMI 42.51 kg/m  , BMI Body mass index is 42.51 kg/m. GENERAL:  Well appearing HEENT:  Pupils equal round and reactive, fundi not visualized, oral mucosa unremarkable NECK:  No jugular venous distention, waveform within normal limits, carotid upstroke brisk and symmetric, no bruits, no thyromegaly LYMPHATICS:  No cervical, inguinal adenopathy LUNGS:  Clear to auscultation bilaterally BACK:  No CVA tenderness CHEST:  Unremarkable HEART:  PMI not displaced or sustained,S1 and S2 within normal limits, no S3, no S4, no clicks, no rubs, no murmurs ABD:  Flat, positive bowel sounds normal in frequency in pitch, no bruits, no rebound, no guarding, no midline pulsatile mass, no hepatomegaly, no splenomegaly EXT:  2 plus pulses throughout, no edema, no cyanosis no clubbing SKIN:  No rashes no nodules NEURO:  Cranial nerves II through XII grossly intact, motor grossly intact throughout PSYCH:  Cognitively intact, oriented to person place and  time    EKG:  EKG is ordered today. The ekg ordered today demonstrates sinus rhythm, rate 89, left axis deviation, possible old inferior infarct, old anteroseptal infarct, nonspecific T-wave flattening.   Recent Labs: 09/30/2015: ALT 55; BUN 8; Creat 0.60; Hemoglobin 14.3; Platelets 230; Potassium 3.8; Sodium 138; TSH 1.79    Lipid Panel    Component Value Date/Time   CHOL 172 06/20/2013 0959   TRIG 162 (H) 06/20/2013 0959   HDL 35 (L) 06/20/2013 0959   CHOLHDL 4.9 06/20/2013 0959   VLDL 32 06/20/2013 0959   LDLCALC 105 (H) 06/20/2013 0959      Wt Readings from Last 3 Encounters:  11/25/15 240 lb (108.9 kg)  09/30/15 236 lb (107 kg)  07/17/14 230  lb (104.3 kg)      Other studies Reviewed: Additional studies/ records that were reviewed today include: none. Review of the above records demonstrates:    ASSESSMENT AND PLAN:  CORONARY ARTERY DISSECTION:    The patient has no ongoing symptoms. She's continuing take an aspirin which I suggested. She had her husband had questions about getting pregnant again. I told her there is going to be little data about this but they're clearly is a risk of recurrent spontaneous coronary artery dissection in patients with this condition. Recent reports in [place]  have suggested the blood pressure control is perhaps the only way to minimize recurrence risk.  Given the fact that she did have preeclampsia during her pregnancy and that blood pressure management would be somewhat unpredictable and that this can be of recurrent disease possibly indicating problems with coronary vascular connective tissue I think the risk would be high enough to suggest that pregnancy would be high risk.  HTN:  The blood pressure is elevated. She says it's about this at home as well. She needs increased physical activity, decrease salt and weight loss. We need to make sure she has good blood pressure control through these therapeutic lifestyle changes. She's going to keep a blood pressure diary and report these to me. I would be shooting for a goal of 130/85 no prior and if we can achieve this with lifestyle changes she will need medications. She understands the importance of this.  REDUCED EF:  I don't have any of the records from her initial hospitalization which was in [place]. I've asked her to sign a release so we can get these records. I'm also going to order an echocardiogram to understand her ejection fraction.     Current medicines are reviewed at length with the patient today.  The patient does not have concerns regarding medicines.  The following changes have been made:  no change  Labs/ tests ordered today include:    Orders Placed This Encounter  Procedures  . EKG 12-Lead  . ECHOCARDIOGRAM COMPLETE     Disposition:   FU with me in two years or sooner if the BP is not at target.     Signed, Minus Breeding, MD  11/25/2015 12:35 PM    [facility]

## 2015-11-25 ENCOUNTER — Encounter: Payer: Self-pay | Admitting: Cardiology

## 2015-11-25 ENCOUNTER — Ambulatory Visit (INDEPENDENT_AMBULATORY_CARE_PROVIDER_SITE_OTHER): Payer: BLUE CROSS/BLUE SHIELD | Admitting: Cardiology

## 2015-11-25 VITALS — BP 134/102 | HR 89 | Ht 63.0 in | Wt 240.0 lb

## 2015-11-25 DIAGNOSIS — I252 Old myocardial infarction: Secondary | ICD-10-CM | POA: Diagnosis not present

## 2015-11-25 NOTE — Patient Instructions (Addendum)
Medication Instructions:  Continue current medications  Labwork: None Ordered  Testing/Procedures: Your physician has requested that you have an echocardiogram. Echocardiography is a painless test that uses sound waves to create images of your heart. It provides your doctor with information about the size and shape of your heart and how well your heart's chambers and valves are working. This procedure takes approximately one hour. There are no restrictions for this procedure.  Follow-Up: Your physician wants you to follow-up in: 2 Years. You will receive a reminder letter in the mail two months in advance. If you don't receive a letter, please call our office to schedule the follow-up appointment.   Any Other Special Instructions Will Be Listed Below (If Applicable).   If you need a refill on your cardiac medications before your next appointment, please call your pharmacy.   

## 2015-12-09 ENCOUNTER — Ambulatory Visit (HOSPITAL_COMMUNITY): Payer: BLUE CROSS/BLUE SHIELD | Attending: Cardiovascular Disease

## 2015-12-09 DIAGNOSIS — E669 Obesity, unspecified: Secondary | ICD-10-CM | POA: Insufficient documentation

## 2015-12-09 DIAGNOSIS — E785 Hyperlipidemia, unspecified: Secondary | ICD-10-CM | POA: Diagnosis not present

## 2015-12-09 DIAGNOSIS — Z6841 Body Mass Index (BMI) 40.0 and over, adult: Secondary | ICD-10-CM | POA: Insufficient documentation

## 2015-12-09 DIAGNOSIS — I517 Cardiomegaly: Secondary | ICD-10-CM | POA: Insufficient documentation

## 2015-12-09 DIAGNOSIS — I252 Old myocardial infarction: Secondary | ICD-10-CM | POA: Insufficient documentation

## 2015-12-09 DIAGNOSIS — I213 ST elevation (STEMI) myocardial infarction of unspecified site: Secondary | ICD-10-CM | POA: Diagnosis present

## 2016-01-16 ENCOUNTER — Encounter: Payer: Self-pay | Admitting: Obstetrics & Gynecology

## 2016-01-16 ENCOUNTER — Ambulatory Visit (INDEPENDENT_AMBULATORY_CARE_PROVIDER_SITE_OTHER): Payer: BLUE CROSS/BLUE SHIELD | Admitting: Obstetrics & Gynecology

## 2016-01-16 ENCOUNTER — Encounter: Payer: Self-pay | Admitting: *Deleted

## 2016-01-16 ENCOUNTER — Other Ambulatory Visit (HOSPITAL_COMMUNITY)
Admission: RE | Admit: 2016-01-16 | Discharge: 2016-01-16 | Disposition: A | Discharge status: Home / Self Care | Payer: BLUE CROSS/BLUE SHIELD | Source: Ambulatory Visit | Attending: Obstetrics & Gynecology | Admitting: Obstetrics & Gynecology

## 2016-01-16 VITALS — BP 140/89 | HR 81 | Resp 16 | Ht 63.0 in | Wt 216.0 lb

## 2016-01-16 DIAGNOSIS — Z01812 Encounter for preprocedural laboratory examination: Secondary | ICD-10-CM

## 2016-01-16 DIAGNOSIS — N926 Irregular menstruation, unspecified: Secondary | ICD-10-CM | POA: Diagnosis not present

## 2016-01-16 DIAGNOSIS — Z3202 Encounter for pregnancy test, result negative: Secondary | ICD-10-CM | POA: Diagnosis not present

## 2016-01-16 DIAGNOSIS — E282 Polycystic ovarian syndrome: Secondary | ICD-10-CM

## 2016-01-16 DIAGNOSIS — N920 Excessive and frequent menstruation with regular cycle: Secondary | ICD-10-CM

## 2016-01-16 LAB — POCT URINE PREGNANCY: Preg Test, Ur: NEGATIVE

## 2016-01-16 NOTE — Progress Notes (Signed)
   Subjective:    Patient ID: Carol Wright, female    DOB: [birth year], 30 y.o.   MRN: [id number]  HPI  Pt presents for .irregular cycle.  Pt has life long history of irregular menses.  She can have a menses every month, can skip a couple of months, and used to go years with out a menses.  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   Most recently, pt had menses Sept 13-25, then Oct 3-Nov 2; Goes from heavy with clots, to staining to brown and then heavy againPt still trying to conceive.  TVUS was ordered at last visit but not done.  Hgb A1c, TSH were nml.  LFTs are still elevated (sees PCP about this).  Pt has recently lost weight.    Review of Systems  Constitutional: Negative for fatigue and unexpected weight change.  Respiratory: Negative.   Cardiovascular: Negative.   Gastrointestinal: Negative.   Genitourinary: Positive for menstrual problem and vaginal bleeding. Negative for pelvic pain.  Neurological: Negative.   Psychiatric/Behavioral: Negative.        Objective:   Physical Exam  Constitutional: She is oriented to person, place, and time. She appears well-developed and well-nourished. No distress.  HENT:  Head: Normocephalic and atraumatic.  Eyes: Conjunctivae are normal.  Pulmonary/Chest: Effort normal.  Abdominal: Soft. Bowel sounds are normal. She exhibits no distension and no mass. There is no tenderness. There is no rebound and no guarding.  Genitourinary: Vagina normal.  Genitourinary Comments: No vulvar lesion No vaginal lesion Cervix--no lesion, non tender Uterus--Nml   Musculoskeletal: She exhibits no edema.  Neurological: She is alert and oriented to person, place, and time.  Skin: Skin is warm and dry.  Psychiatric: She has a normal mood and affect.  Vitals reviewed.   Assessment & Plan:  30 yo female with irregular cycles and polymenorrhea.    1-PCO appearance with habitus / metabolic syndromw; +acanthosis nigricans 2-Endometrial biopsy 3-Pelvic and TVUS 4-RTC 2  weeks for result and plan  ENDOMETRIAL BIOPSY     The indications for endometrial biopsy were reviewed.   Risks of the biopsy including cramping, bleeding, infection, uterine perforation, inadequate specimen and need for additional procedures  were discussed. The patient states she understands and agrees to undergo procedure today. Consent was signed. Time out was performed. Urine HCG was negative. A sterile speculum was placed in the patient's vagina and the cervix was prepped with Betadine. A single-toothed tenaculum was placed on the anterior lip of the cervix to stabilize it. The 3 mm pipelle was introduced into the endometrial cavity without difficulty to a depth of 8.5 cm, and a moderate amount of tissue was obtained and sent to pathology. The instruments were removed from the patient's vagina. Minimal bleeding from the cervix was noted. The patient tolerated the procedure well. Routine post-procedure instructions were given to the patient. The patient will follow up to review the results and for further management.

## 2016-01-20 ENCOUNTER — Ambulatory Visit (INDEPENDENT_AMBULATORY_CARE_PROVIDER_SITE_OTHER): Payer: BLUE CROSS/BLUE SHIELD

## 2016-01-20 DIAGNOSIS — N926 Irregular menstruation, unspecified: Secondary | ICD-10-CM

## 2016-02-03 ENCOUNTER — Encounter: Payer: Self-pay | Admitting: Obstetrics & Gynecology

## 2016-02-03 ENCOUNTER — Ambulatory Visit (INDEPENDENT_AMBULATORY_CARE_PROVIDER_SITE_OTHER): Payer: BLUE CROSS/BLUE SHIELD | Admitting: Obstetrics & Gynecology

## 2016-02-03 VITALS — BP 132/78 | HR 74 | Ht 63.0 in | Wt 207.0 lb

## 2016-02-03 DIAGNOSIS — E282 Polycystic ovarian syndrome: Secondary | ICD-10-CM

## 2016-02-03 MED ORDER — LETROZOLE 2.5 MG PO TABS: 2.5000 mg | ORAL_TABLET | Freq: Every day | ORAL | 0 refills | Status: DC

## 2016-02-03 MED ORDER — METFORMIN HCL 500 MG PO TABS: 500.0000 mg | ORAL_TABLET | Freq: Two times a day (BID) | ORAL | 2 refills | Status: DC

## 2016-02-03 MED ORDER — MEDROXYPROGESTERONE ACETATE 10 MG PO TABS: 10.0000 mg | ORAL_TABLET | Freq: Every day | ORAL | 2 refills | Status: DC

## 2016-02-03 NOTE — Progress Notes (Signed)
Patient ID: Carol Wright, female   DOB: [birth year], 30 y.o.   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 Patient has had preconceptual counseling with Dr. Fransisco Beau of [facility] and also seen Dr. Percival Spanish at heart care.  Patient had transvaginal ultrasound which showed a 7 mm lining and normal ovaries other than multiple peripheral follicles. This is consistent with PCO S. Patient also had a benign endometrial biopsy which showed disordered endometrium.  After careful consideration the patient would like to try to become pregnant. In order to facilitate her ovulation we will start her on metformin 500 mg daily to increase in 1 week to 500 mg twice a day. She has a normal creatinine in July 2017. The patient also will reset her menstrual cycle with Provera 10 mg daily starting today. On day 3 of her cycle she will start Femara 2.5 mg days 3 through 7. She continues to have a low glycemic diet and has lost 5 more pounds.  She understands risks with pregnancy. Patient understands she will have to deliver at [facility] due to need for cardiovascular services. We will comanage her with maternal fetal medicine a [facility]. If not pregnant after 3 months patient will transfer to Dr. Joretta Bachelor for further treatments.  Patient is to continue folic acid and aspirin at this time.  15 minutes spent face-to-face with the patient with greater than 50% counseling.

## 2016-05-06 ENCOUNTER — Encounter: Payer: Self-pay | Admitting: Obstetrics & Gynecology

## 2016-05-06 ENCOUNTER — Ambulatory Visit (INDEPENDENT_AMBULATORY_CARE_PROVIDER_SITE_OTHER): Payer: BLUE CROSS/BLUE SHIELD | Admitting: Obstetrics & Gynecology

## 2016-05-06 VITALS — BP 141/88 | HR 87 | Ht 63.0 in | Wt 206.0 lb

## 2016-05-06 DIAGNOSIS — Z319 Encounter for procreative management, unspecified: Secondary | ICD-10-CM | POA: Diagnosis not present

## 2016-05-06 DIAGNOSIS — I10 Essential (primary) hypertension: Secondary | ICD-10-CM | POA: Diagnosis not present

## 2016-05-06 DIAGNOSIS — E669 Obesity, unspecified: Secondary | ICD-10-CM | POA: Diagnosis not present

## 2016-05-06 NOTE — Progress Notes (Signed)
   Subjective:    Patient ID: Carol Wright, female    DOB: [birth year], 31 y.o.   MRN: [id number]  HPI 1-year-old female presents for follow-up of infertility. She has been taking Glucophage twice a day and also did 3 rounds of Femara. Patient has had a monthly menses and not pregnant. Patient has same father of baby but no recent semen analysis. Patient has new onset hypertension today. She is taken her blood pressure at home before and it is normal. Approximately 130/80. Patient will take blood pressure at home and give [place] a call and let us know what it is.   Review of Systems  Constitutional: Negative.   Respiratory: Negative.   Cardiovascular: Negative.   Gastrointestinal: Positive for constipation.  Psychiatric/Behavioral: Negative.        Objective:   Physical Exam  Constitutional: She is oriented to person, place, and time. She appears well-developed and well-nourished. No distress.  HENT:  Head: Normocephalic and atraumatic.  Eyes: Conjunctivae are normal.  Pulmonary/Chest: Effort normal.  Musculoskeletal: She exhibits no edema.  Neurological: She is alert and oriented to person, place, and time.  Skin: Skin is warm and dry.  Psychiatric: She has a normal mood and affect.  Vitals reviewed.  Vitals:   05/06/16 0933  BP: (!) 141/88  Pulse: 87  Weight: 206 lb (93.4 kg)  Height: 5\' 3"  (1.6 m)   Assessment & Plan:  31 year old female with infertility 1-  healthcare maintenance up-to-date 2-  elevated liver enzymes in the past. Patient was told she has fatty liver. This will be followed up her by her primary care provider 3-  history of coronary artery dissection--patient under the care of cardiology and has had an MFM consult surrounding subsequent pregnancy. 4-  HTN:  The blood pressure is elevated. She needs increased physical activity, decrease salt and weight loss. We need to make sure she has good blood pressure control through these therapeutic lifestyle changes.  She's going to keep a blood pressure diary and report these to me.Goal is 135/80. 5-  discussed options of continuing with office therapy for infertility referral to Dr. Joretta Bachelor. Patient opts for referral which will be handled today.

## 2016-05-06 NOTE — Progress Notes (Signed)
Took pt's BP on her right arm and it was 141/94. Took the pt's BP again but, on her left arm and it was 143/95. Pt's BP was taken a third time on her left arm about 20 minutes later and it was 141/88.

## 2016-06-09 ENCOUNTER — Encounter: Payer: Self-pay | Admitting: *Deleted

## 2016-06-10 ENCOUNTER — Ambulatory Visit (INDEPENDENT_AMBULATORY_CARE_PROVIDER_SITE_OTHER): Payer: BLUE CROSS/BLUE SHIELD | Admitting: Obstetrics & Gynecology

## 2016-06-10 ENCOUNTER — Encounter: Payer: Self-pay | Admitting: Obstetrics & Gynecology

## 2016-06-10 VITALS — BP 133/87 | HR 85 | Wt 217.0 lb

## 2016-06-10 DIAGNOSIS — Z3491 Encounter for supervision of normal pregnancy, unspecified, first trimester: Secondary | ICD-10-CM | POA: Diagnosis not present

## 2016-06-10 DIAGNOSIS — O0990 Supervision of high risk pregnancy, unspecified, unspecified trimester: Secondary | ICD-10-CM | POA: Insufficient documentation

## 2016-06-10 DIAGNOSIS — O09291 Supervision of pregnancy with other poor reproductive or obstetric history, first trimester: Secondary | ICD-10-CM

## 2016-06-10 DIAGNOSIS — Z3689 Encounter for other specified antenatal screening: Secondary | ICD-10-CM

## 2016-06-10 DIAGNOSIS — Z3490 Encounter for supervision of normal pregnancy, unspecified, unspecified trimester: Secondary | ICD-10-CM

## 2016-06-10 DIAGNOSIS — Z113 Encounter for screening for infections with a predominantly sexual mode of transmission: Secondary | ICD-10-CM

## 2016-06-10 DIAGNOSIS — E669 Obesity, unspecified: Secondary | ICD-10-CM

## 2016-06-10 NOTE — Progress Notes (Signed)
Subjective:    Carol Wright is a [id number] [redacted]w[redacted]d being seen today for her first obstetrical visit.  Her obstetrical history is significant for severe pre-E at 35 weeks, obesity, spontaneous coronary artery dissection. Patient does intend to breast feed. Pregnancy history fully reviewed.  Patient reports mild nausea.  Vitals:   06/10/16 1250  BP: 133/87  Pulse: 85  Weight: 217 lb (98.4 kg)    HISTORY: OB History  Gravida Para Term Preterm AB Living  2 1 0 1   1  SAB TAB Ectopic Multiple Live Births          1    # Outcome Date GA Lbr Len/2nd Weight Sex Delivery Anes PTL Lv  2 Current           1 Preterm     F CS-Unspec        Past Medical History:  Diagnosis Date  . Depression   . Generalized anxiety disorder   . H/O arterial dissection   . Hepatic steatosis    Via ultrasound.    . Hyperlipidemia   . Myocardial infarction 2013  . Obesity (BMI 30-39.9)   . PCOS (polycystic ovarian syndrome)   . Pre-eclampsia    Past Surgical History:  Procedure Laterality Date  . Arterial disection  2013  . Bare metal stent  2013  . CESAREAN SECTION  2013   Family History  Problem Relation Age of Onset  . Hypertension Mother   . Hypertension Maternal Grandmother   . Heart attack Maternal Grandfather      Exam    Uterus:     Pelvic Exam:    Perineum: No Hemorrhoids   Vulva: normal, no lesion   Vagina:  normal discharge   pH: n/a   Cervix: no cervical motion tenderness and no lesions   Adnexa: normal adnexa   Bony Pelvis: average  System: Breast:  normal appearance, no masses or tenderness   Skin: normal coloration and turgor, no rashes    Neurologic: oriented, normal mood   Extremities: no deformities   HEENT sclera clear, anicteric, oropharynx clear, no lesions, neck supple with midline trachea, thyroid without masses and trachea midline   Mouth/Teeth mucous membranes moist, pharynx normal without lesions and dental hygiene good   Neck supple and no masses   Cardiovascular: regular rate and rhythm   Respiratory:  appears well, vitals normal, no respiratory distress, acyanotic, normal RR, chest clear, no wheezing, crepitations, rhonchi, normal symmetric air entry   Abdomen: soft, non-tender; bowel sounds normal; no masses,  no organomegaly   Urinary: urethral meatus normal      Assessment:    Pregnancy: [id number] Patient Active Problem List   Diagnosis Date Noted  . Supervision of normal pregnancy 06/10/2016  . Hypertension 05/06/2016  . Infertility management 05/06/2016  . PCO (polycystic ovaries) 01/16/2016  . Elevated blood pressure 06/22/2014  . CAD (coronary artery disease) 07/12/2013  . Obesity (BMI 30-39.9) 06/20/2013  . Hx of myocardial infarction 06/20/2013  . Hepatic steatosis 06/20/2013  . Depression 06/13/2013  . Generalized anxiety disorder 06/13/2013        Plan:     Initial labs drawn. Prenatal vitamins. Problem list reviewed and updated. Genetic Screening discussed First Screen: ordered. Ultrasound discussed; fetal survey: ordered. Follow up in 3 weeks. Patient saw MFM Dr. Seward Meth in 2017 with following recommendations.   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 She was evaluated for pulmonary embolism with first lower extremity dopplers which were negative and then had the diagnosis made on echocardiogram?angiogram?  She had a metal stent placed at that time and was started on daily low dose aspirin, plavix and also topral. Plavix was stopped the week prior to delivery when started to develop preeclampsia symptoms.  - Limited published literature about SCAD in pregnancy. There is a single report of repeat SCAD in a subsequent pregnancy published in 2017. The questions is how many folks who have had SCAD attempt a second pregnancy.  - MFM  Brost was to get a copy of a draft of a case series of SCAD in pregnancy that will soon be published in [facility].  Will see if he has this information. #2 Chronic hypertension - She notes she gets "white coat hypertension" and is not currently on any medications for blood pressure control. - She states that her blood pressures she checks outside of medical visits is in the 130/80s range and realizes that she has borderline hypertension.   Pt to take home BP and keep log.  She will call us if it is >140/90, - Weight gain 12-20 pounds - Baseline 24-hour urine and preeclampsia labs. - Early ultrasound for firm obstetric dating, aneuploidy screening as desired, fetal anatomic survey at ~18-[redacted] weeks gestation, serial ultrasounds for evaluation of fetal growth starting ~[redacted] weeks gestation, daily maternal assessment of fetal movement started at ~[redacted] weeks gestation with immediate provider contact for decreased or no fetal movement, serial antenatal testing starting ~[redacted] weeks gestation or as clinically indicated. Consider induction at ~37 weeks given history of SCAD.  -Follow up with cardiologist and MFM for co management -Will need to deliver at [facility] #3 History of preeclampsia - We discussed the symptoms, fetal/neonatal and maternal implications of recurrence based on the timing of onset and severity in subsequent pregnancy.  - She is currently on a ASA 81 mg daily and will continue this in pregnancy #4 History of cesarean delivery - The couple had been told they could undergo subsequent TOL/VBAC. Will get op note to confirm scar.  #5 History of depression - Depression screen today was negative   Silas [place] 06/10/2016

## 2016-06-10 NOTE — Progress Notes (Signed)
Bedside U/S shows IUP with FHT of 125BPM and  CRL of 9.22mm  GA is [redacted]w[redacted]d

## 2016-06-11 LAB — PRENATAL PROFILE (SOLSTAS)
Antibody Screen: NEGATIVE
BASOS ABS: 0 {cells}/uL (ref 0–200)
Basophils Relative: 0 %
Eosinophils Absolute: 106 cells/uL (ref 15–500)
Eosinophils Relative: 1 %
HEMATOCRIT: 37.8 % (ref 35.0–45.0)
HEP B S AG: NEGATIVE
HIV: NONREACTIVE
Hemoglobin: 12.5 g/dL (ref 11.7–15.5)
LYMPHS PCT: 21 %
Lymphs Abs: 2226 cells/uL (ref 850–3900)
MCH: 28 pg (ref 27.0–33.0)
MCHC: 33.1 g/dL (ref 32.0–36.0)
MCV: 84.8 fL (ref 80.0–100.0)
MONO ABS: 742 {cells}/uL (ref 200–950)
MPV: 10.8 fL (ref 7.5–12.5)
Monocytes Relative: 7 %
NEUTROS PCT: 71 %
Neutro Abs: 7526 cells/uL (ref 1500–7800)
Platelets: 213 10*3/uL (ref 140–400)
RBC: 4.46 MIL/uL (ref 3.80–5.10)
RDW: 15.5 % — ABNORMAL HIGH (ref 11.0–15.0)
RH TYPE: POSITIVE
RUBELLA: 6.77 {index} — AB (ref <0.90)
WBC: 10.6 10*3/uL (ref 3.8–10.8)

## 2016-06-11 LAB — PROTEIN / CREATININE RATIO, URINE
Creatinine, Urine: 140 mg/dL (ref 20–320)
PROTEIN CREATININE RATIO: 114 mg/g{creat} (ref 21–161)
Total Protein, Urine: 16 mg/dL (ref 5–24)

## 2016-06-11 LAB — VITAMIN D 25 HYDROXY (VIT D DEFICIENCY, FRACTURES): Vit D, 25-Hydroxy: 22 ng/mL — ABNORMAL LOW (ref 30–100)

## 2016-06-11 LAB — TSH: TSH: 0.99 mIU/L

## 2016-06-12 LAB — URINE CYTOLOGY ANCILLARY ONLY
Chlamydia: NEGATIVE
Neisseria Gonorrhea: NEGATIVE

## 2016-06-12 LAB — CULTURE, OB URINE: ORGANISM ID, BACTERIA: NO GROWTH

## 2016-06-15 ENCOUNTER — Other Ambulatory Visit: Payer: Self-pay

## 2016-06-15 ENCOUNTER — Telehealth: Payer: Self-pay

## 2016-06-15 ENCOUNTER — Other Ambulatory Visit: Payer: Self-pay | Admitting: Obstetrics & Gynecology

## 2016-06-15 MED ORDER — VITAMIN D3 25 MCG (1000 UNIT) PO TABS: 1000.0000 [IU] | ORAL_TABLET | Freq: Every day | ORAL | 3 refills | Status: DC

## 2016-06-15 NOTE — Progress Notes (Signed)
Vitamin D is low.  Pt to supplement with 1000IU daily.  Redraw in 6 weeks.

## 2016-06-15 NOTE — Telephone Encounter (Signed)
Pt is aware of Vit D lab results. Pt is aware that it has been sent to her pharmacy and we will recheck it in 6 weeks.

## 2016-06-22 ENCOUNTER — Encounter: Payer: Self-pay | Admitting: Obstetrics & Gynecology

## 2016-06-22 ENCOUNTER — Telehealth: Payer: Self-pay | Admitting: *Deleted

## 2016-06-22 ENCOUNTER — Other Ambulatory Visit: Payer: Self-pay | Admitting: *Deleted

## 2016-06-22 DIAGNOSIS — R7989 Other specified abnormal findings of blood chemistry: Secondary | ICD-10-CM | POA: Insufficient documentation

## 2016-06-22 NOTE — Telephone Encounter (Signed)
Called patient to let her know Dr Gala Romney had talked with pt's cardiologist and her recommended her BP ranges be 120/70.  Pt is aware of this now.  Her last 4 BP's are 126/89 125/88 129/87 129/79.  Pt will also bring her BP monitor with her @ next visit so that we con make sure they are similar in readings.  Dr leggett is aware.

## 2016-06-22 NOTE — Telephone Encounter (Signed)
-----   Message from Guss Bunde, MD sent at 06/22/2016 11:07 AM EDT ----- I heard back from cardiologist.  Goal BP is 120/70.  Pt is supposed to keep log with BPs.  Can you call her and see what her BPs are.  Alos, can she please bring in her home BP monitor next visit and compare to ours.

## 2016-07-06 ENCOUNTER — Encounter: Payer: BLUE CROSS/BLUE SHIELD | Admitting: Obstetrics & Gynecology

## 2016-07-06 ENCOUNTER — Ambulatory Visit (INDEPENDENT_AMBULATORY_CARE_PROVIDER_SITE_OTHER): Payer: BLUE CROSS/BLUE SHIELD | Admitting: Advanced Practice Midwife

## 2016-07-06 VITALS — BP 124/81 | HR 81 | Wt 211.0 lb

## 2016-07-06 DIAGNOSIS — I2542 Coronary artery dissection: Secondary | ICD-10-CM

## 2016-07-06 DIAGNOSIS — O0990 Supervision of high risk pregnancy, unspecified, unspecified trimester: Secondary | ICD-10-CM

## 2016-07-06 DIAGNOSIS — Z3A10 10 weeks gestation of pregnancy: Secondary | ICD-10-CM

## 2016-07-06 DIAGNOSIS — O99419 Diseases of the circulatory system complicating pregnancy, unspecified trimester: Secondary | ICD-10-CM

## 2016-07-06 DIAGNOSIS — I2510 Atherosclerotic heart disease of native coronary artery without angina pectoris: Secondary | ICD-10-CM

## 2016-07-06 DIAGNOSIS — O0991 Supervision of high risk pregnancy, unspecified, first trimester: Secondary | ICD-10-CM

## 2016-07-06 DIAGNOSIS — O10911 Unspecified pre-existing hypertension complicating pregnancy, first trimester: Secondary | ICD-10-CM

## 2016-07-06 DIAGNOSIS — O99411 Diseases of the circulatory system complicating pregnancy, first trimester: Secondary | ICD-10-CM

## 2016-07-06 DIAGNOSIS — O10919 Unspecified pre-existing hypertension complicating pregnancy, unspecified trimester: Secondary | ICD-10-CM | POA: Insufficient documentation

## 2016-07-06 NOTE — Patient Instructions (Signed)
First Trimester of Pregnancy The first trimester of pregnancy is from week 1 until the end of week 13 (months 1 through 3). A week after a sperm fertilizes an egg, the egg will implant on the wall of the uterus. This embryo will begin to develop into a baby. Genes from you and your partner will form the baby. The female genes will determine whether the baby will be a boy or a girl. At 6-8 weeks, the eyes and face will be formed, and the heartbeat can be seen on ultrasound. At the end of 12 weeks, all the baby's organs will be formed. Now that you are pregnant, you will want to do everything you can to have a healthy baby. Two of the most important things are to get good prenatal care and to follow your health care provider's instructions. Prenatal care is all the medical care you receive before the baby's birth. This care will help prevent, find, and treat any problems during the pregnancy and childbirth. Body changes during your first trimester Your body goes through many changes during pregnancy. The changes vary from woman to woman.  You may gain or lose a couple of pounds at first.  You may feel sick to your stomach (nauseous) and you may throw up (vomit). If the vomiting is uncontrollable, call your health care provider.  You may tire easily.  You may develop headaches that can be relieved by medicines. All medicines should be approved by your health care provider.  You may urinate more often. Painful urination may mean you have a bladder infection.  You may develop heartburn as a result of your pregnancy.  You may develop constipation because certain hormones are causing the muscles that push stool through your intestines to slow down.  You may develop hemorrhoids or swollen veins (varicose veins).  Your breasts may begin to grow larger and become tender. Your nipples may stick out more, and the tissue that surrounds them (areola) may become darker.  Your gums may bleed and may be  sensitive to brushing and flossing.  Dark spots or blotches (chloasma, mask of pregnancy) may develop on your face. This will likely fade after the baby is born.  Your menstrual periods will stop.  You may have a loss of appetite.  You may develop cravings for certain kinds of food.  You may have changes in your emotions from day to day, such as being excited to be pregnant or being concerned that something may go wrong with the pregnancy and baby.  You may have more vivid and strange dreams.  You may have changes in your hair. These can include thickening of your hair, rapid growth, and changes in texture. Some women also have hair loss during or after pregnancy, or hair that feels dry or thin. Your hair will most likely return to normal after your baby is born.  What to expect at prenatal visits During a routine prenatal visit:  You will be weighed to make sure you and the baby are growing normally.  Your blood pressure will be taken.  Your abdomen will be measured to track your baby's growth.  The fetal heartbeat will be listened to between weeks 10 and 14 of your pregnancy.  Test results from any previous visits will be discussed.  Your health care provider may ask you:  How you are feeling.  If you are feeling the baby move.  If you have had any abnormal symptoms, such as leaking fluid, bleeding, severe headaches,   or abdominal cramping.  If you are using any tobacco products, including cigarettes, chewing tobacco, and electronic cigarettes.  If you have any questions.  Other tests that may be performed during your first trimester include:  Blood tests to find your blood type and to check for the presence of any previous infections. The tests will also be used to check for low iron levels (anemia) and protein on red blood cells (Rh antibodies). Depending on your risk factors, or if you previously had diabetes during pregnancy, you may have tests to check for high blood  sugar that affects pregnant women (gestational diabetes).  Urine tests to check for infections, diabetes, or protein in the urine.  An ultrasound to confirm the proper growth and development of the baby.  Fetal screens for spinal cord problems (spina bifida) and Down syndrome.  HIV (human immunodeficiency virus) testing. Routine prenatal testing includes screening for HIV, unless you choose not to have this test.  You may need other tests to make sure you and the baby are doing well.  Follow these instructions at home: Medicines  Follow your health care provider's instructions regarding medicine use. Specific medicines may be either safe or unsafe to take during pregnancy.  Take a prenatal vitamin that contains at least 600 micrograms (mcg) of folic acid.  If you develop constipation, try taking a stool softener if your health care provider approves. Eating and drinking  Eat a balanced diet that includes fresh fruits and vegetables, whole grains, good sources of protein such as meat, eggs, or tofu, and low-fat dairy. Your health care provider will help you determine the amount of weight gain that is right for you.  Avoid raw meat and uncooked cheese. These carry germs that can cause birth defects in the baby.  Eating four or five small meals rather than three large meals a day may help relieve nausea and vomiting. If you start to feel nauseous, eating a few soda crackers can be helpful. Drinking liquids between meals, instead of during meals, also seems to help ease nausea and vomiting.  Limit foods that are high in fat and processed sugars, such as fried and sweet foods.  To prevent constipation: ? Eat foods that are high in fiber, such as fresh fruits and vegetables, whole grains, and beans. ? Drink enough fluid to keep your urine clear or pale yellow. Activity  Exercise only as directed by your health care provider. Most women can continue their usual exercise routine during  pregnancy. Try to exercise for 30 minutes at least 5 days a week. Exercising will help you: ? Control your weight. ? Stay in shape. ? Be prepared for labor and delivery.  Experiencing pain or cramping in the lower abdomen or lower back is a good sign that you should stop exercising. Check with your health care provider before continuing with normal exercises.  Try to avoid standing for long periods of time. Move your legs often if you must stand in one place for a long time.  Avoid heavy lifting.  Wear low-heeled shoes and practice good posture.  You may continue to have sex unless your health care provider tells you not to. Relieving pain and discomfort  Wear a good support bra to relieve breast tenderness.  Take warm sitz baths to soothe any pain or discomfort caused by hemorrhoids. Use hemorrhoid cream if your health care provider approves.  Rest with your legs elevated if you have leg cramps or low back pain.  If you develop   varicose veins in your legs, wear support hose. Elevate your feet for 15 minutes, 3-4 times a day. Limit salt in your diet. Prenatal care  Schedule your prenatal visits by the twelfth week of pregnancy. They are usually scheduled monthly at first, then more often in the last 2 months before delivery.  Write down your questions. Take them to your prenatal visits.  Keep all your prenatal visits as told by your health care provider. This is important. Safety  Wear your seat belt at all times when driving.  Make a list of emergency phone numbers, including numbers for family, friends, the hospital, and police and fire departments. General instructions  Ask your health care provider for a referral to a local prenatal education class. Begin classes no later than the beginning of month 6 of your pregnancy.  Ask for help if you have counseling or nutritional needs during pregnancy. Your health care provider can offer advice or refer you to specialists for help  with various needs.  Do not use hot tubs, steam rooms, or saunas.  Do not douche or use tampons or scented sanitary pads.  Do not cross your legs for long periods of time.  Avoid cat litter boxes and soil used by cats. These carry germs that can cause birth defects in the baby and possibly loss of the fetus by miscarriage or stillbirth.  Avoid all smoking, herbs, alcohol, and medicines not prescribed by your health care provider. Chemicals in these products affect the formation and growth of the baby.  Do not use any products that contain nicotine or tobacco, such as cigarettes and e-cigarettes. If you need help quitting, ask your health care provider. You may receive counseling support and other resources to help you quit.  Schedule a dentist appointment. At home, brush your teeth with a soft toothbrush and be gentle when you floss. Contact a health care provider if:  You have dizziness.  You have mild pelvic cramps, pelvic pressure, or nagging pain in the abdominal area.  You have persistent nausea, vomiting, or diarrhea.  You have a bad smelling vaginal discharge.  You have pain when you urinate.  You notice increased swelling in your face, hands, legs, or ankles.  You are exposed to fifth disease or chickenpox.  You are exposed to German measles (rubella) and have never had it. Get help right away if:  You have a fever.  You are leaking fluid from your vagina.  You have spotting or bleeding from your vagina.  You have severe abdominal cramping or pain.  You have rapid weight gain or loss.  You vomit blood or material that looks like coffee grounds.  You develop a severe headache.  You have shortness of breath.  You have any kind of trauma, such as from a fall or a car accident. Summary  The first trimester of pregnancy is from week 1 until the end of week 13 (months 1 through 3).  Your body goes through many changes during pregnancy. The changes vary from  woman to woman.  You will have routine prenatal visits. During those visits, your health care provider will examine you, discuss any test results you may have, and talk with you about how you are feeling. This information is not intended to replace advice given to you by your health care provider. Make sure you discuss any questions you have with your health care provider. Document Released: 02/24/2001 Document Revised: 02/12/2016 Document Reviewed: 02/12/2016 Elsevier Interactive Patient Education  2017 Elsevier   Inc.  

## 2016-07-06 NOTE — Progress Notes (Signed)
   PRENATAL VISIT NOTE  Subjective:  Carol Wright is a 31 y.o. G2P0101 at [redacted]w[redacted]d being seen today for ongoing prenatal care.  She is currently monitored for the following issues for this high-risk pregnancy and has Depression; Generalized anxiety disorder; Obesity (BMI 30-39.9); Hx of myocardial infarction; Hepatic steatosis; Maternal coronary artery disease affecting pregnancy, antepartum; Elevated blood pressure; PCO (polycystic ovaries); Hypertension; Infertility management; Supervision of high risk pregnancy, antepartum; Low vitamin D level; and Spontaneous dissection of coronary artery on her problem list.  Patient reports no complaints. Denies chest pain, SOB. BPs at home 120-130/90's. Contractions: Not present. Vag. Bleeding: None.  Movement: Absent. Denies leaking of fluid.   The following portions of the patient's history were reviewed and updated as appropriate: allergies, current medications, past family history, past medical history, past social history, past surgical history and problem list. Problem list updated.  Objective:   Vitals:   07/06/16 0819  BP: 124/81  Pulse: 81  Weight: 211 lb (95.7 kg)    Fetal Status: Fetal Heart Rate (bpm): 176   Movement: Absent     General:  Alert, oriented and cooperative. Patient is in no acute distress.  Skin: Skin is warm and dry. No rash noted.   Cardiovascular: Normal heart rate noted  Respiratory: Normal respiratory effort, no problems with respiration noted  Abdomen: Soft, gravid, appropriate for gestational age. Pain/Pressure: Absent     Pelvic:  Cervical exam deferred        Extremities: Normal range of motion.  Edema: None  Mental Status: Normal mood and affect. Normal behavior. Normal judgment and thought content.   Assessment and Plan:  Pregnancy: G2P0101 at [redacted]w[redacted]d  1. [redacted] weeks gestation of pregnancy  - Glucose Tolerance, 2 Hours w/1 Hour  2. Supervision of high risk pregnancy, antepartum   3. Spontaneous dissection  of coronary artery - Has not been called by Cardiologist (Dr Gala Romney discussed scheduling appt now that pt is pregnant.) Instructed pt to schedule appt.   4. Maternal coronary artery disease affecting pregnancy, antepartum   Preterm labor symptoms and general obstetric precautions including but not limited to vaginal bleeding, contractions, leaking of fluid and fetal movement were reviewed in detail with the patient. Please refer to After Visit Summary for other counseling recommendations.  Return in about 4 weeks (around 08/03/2016) for ROB.  Will check on C/S Op note records. First trimester screen scheduled.    Manya Silvas, CNM

## 2016-07-07 LAB — GLUCOSE TOLERANCE, 2 HOURS W/ 1HR
Glucose, 1 hour: 145 mg/dL
Glucose, 2 hour: 116 mg/dL (ref <140)
Glucose, Fasting: 84 mg/dL (ref 65–99)

## 2016-07-08 ENCOUNTER — Telehealth: Payer: Self-pay | Admitting: *Deleted

## 2016-07-08 DIAGNOSIS — O0990 Supervision of high risk pregnancy, unspecified, unspecified trimester: Secondary | ICD-10-CM

## 2016-07-08 NOTE — Telephone Encounter (Signed)
Pt notified of normal 2 hr GTT. 

## 2016-07-16 ENCOUNTER — Telehealth: Payer: Self-pay | Admitting: *Deleted

## 2016-07-16 NOTE — Telephone Encounter (Signed)
Spoke with pt about her recent BP's that she has been taking @ home.  The readings are as follow: 4/26  122/90 4/27  126/85  4/28  127/91 4/29  132/93 4/30  135/91  5/1    125/88  5/2    122/86 Pt has appt with Dr Gala Romney 07/21/16

## 2016-07-16 NOTE — Telephone Encounter (Signed)
-----   Message from Guss Bunde, MD sent at 07/15/2016  6:51 AM EDT ----- Is patient still taking her BPs at home.  If so, what are they.  We need to keep a very close eye on her.    Can her cards appt be moved up? ----- Message ----- From: Asencion Islam, RN Sent: 06/22/2016  12:28 PM To: Guss Bunde, MD  Last 4 BP's  126/89 125/88 129/87 129/79  She will bring her BP monitor @ next visit. ----- Message ----- From: Guss Bunde, MD Sent: 06/22/2016  11:07 AM To: Asencion Islam, RN  I heard back from cardiologist.  Goal BP is 120/70.  Pt is supposed to keep log with BPs.  Can you call her and see what her BPs are.  Alos, can she please bring in her home BP monitor next visit and compare to ours.

## 2016-07-21 ENCOUNTER — Ambulatory Visit (INDEPENDENT_AMBULATORY_CARE_PROVIDER_SITE_OTHER): Payer: BLUE CROSS/BLUE SHIELD | Admitting: Obstetrics & Gynecology

## 2016-07-21 VITALS — BP 132/81 | HR 98 | Wt 215.0 lb

## 2016-07-21 DIAGNOSIS — O99412 Diseases of the circulatory system complicating pregnancy, second trimester: Secondary | ICD-10-CM

## 2016-07-21 DIAGNOSIS — I2510 Atherosclerotic heart disease of native coronary artery without angina pectoris: Secondary | ICD-10-CM

## 2016-07-21 DIAGNOSIS — O99419 Diseases of the circulatory system complicating pregnancy, unspecified trimester: Secondary | ICD-10-CM

## 2016-07-21 DIAGNOSIS — O0990 Supervision of high risk pregnancy, unspecified, unspecified trimester: Secondary | ICD-10-CM

## 2016-07-21 DIAGNOSIS — R7989 Other specified abnormal findings of blood chemistry: Secondary | ICD-10-CM

## 2016-07-21 DIAGNOSIS — E559 Vitamin D deficiency, unspecified: Secondary | ICD-10-CM

## 2016-07-21 DIAGNOSIS — O0992 Supervision of high risk pregnancy, unspecified, second trimester: Secondary | ICD-10-CM

## 2016-07-21 DIAGNOSIS — E282 Polycystic ovarian syndrome: Secondary | ICD-10-CM

## 2016-07-21 MED ORDER — LABETALOL HCL 100 MG PO TABS: 200.0000 mg | ORAL_TABLET | Freq: Two times a day (BID) | ORAL | 2 refills | Status: DC

## 2016-07-21 NOTE — Progress Notes (Signed)
Cards appt 5/21   PRENATAL VISIT NOTE  Subjective:  Carol Wright is a 31 y.o. G2P0101 at [redacted]w[redacted]d being seen today for ongoing prenatal care.  She is currently monitored for the following issues for this high-risk pregnancy and has Depression; Generalized anxiety disorder; Obesity (BMI 30-39.9); Hx of myocardial infarction; Hepatic steatosis; Maternal coronary artery disease affecting pregnancy, antepartum; Elevated blood pressure; PCO (polycystic ovaries); Hypertension; Infertility management; Supervision of high risk pregnancy, antepartum; Low vitamin D level; Spontaneous dissection of coronary artery; and Chronic hypertension during pregnancy, antepartum on her problem list.  Patient reports no complaints.  Contractions: Not present. Vag. Bleeding: None.  Movement: Absent. Denies leaking of fluid.   The following portions of the patient's history were reviewed and updated as appropriate: allergies, current medications, past family history, past medical history, past social history, past surgical history and problem list. Problem list updated.  Objective:   Vitals:   07/21/16 1317  BP: 132/81  Pulse: 98  Weight: 215 lb (97.5 kg)    Fetal Status: Fetal Heart Rate (bpm): 167   Movement: Absent     General:  Alert, oriented and cooperative. Patient is in no acute distress.  Skin: Skin is warm and dry. No rash noted.   Cardiovascular: Normal heart rate noted  Respiratory: Normal respiratory effort, no problems with respiration noted  Abdomen: Soft, gravid, appropriate for gestational age. Pain/Pressure: Absent     Pelvic:  Cervical exam deferred        Extremities: Normal range of motion.  Edema: None  Mental Status: Normal mood and affect. Normal behavior. Normal judgment and thought content.   Assessment and Plan:  Pregnancy: G2P0101 at [redacted]w[redacted]d  1. Supervision of high risk pregnancy, antepartum Pt already taking ASA; hass appt with MFM for first screen and consult; Need c/s note from  last delivery; AFP at 15 weeks or greater  2. PCO (polycystic ovaries) Passed early one hour  3. Low vitamin D level Needs vit d level drawn next visit  4. Maternal coronary artery disease affecting pregnancy, antepartum BP gaol 120/70.  Home BP monitoring is 130s/80.  Will start labetalol 100 mg.  Pt to call with BPs end of week to see if dose needs to be adjusted.   Preterm labor symptoms and general obstetric precautions including but not limited to vaginal bleeding, contractions, leaking of fluid and fetal movement were reviewed in detail with the patient. Please refer to After Visit Summary for other counseling recommendations.  Return in about 3 weeks (around 08/11/2016).   Guss Bunde, MD

## 2016-07-27 ENCOUNTER — Other Ambulatory Visit: Payer: Self-pay | Admitting: Obstetrics & Gynecology

## 2016-07-27 ENCOUNTER — Ambulatory Visit (HOSPITAL_COMMUNITY)
Admission: RE | Admit: 2016-07-27 | Discharge: 2016-07-27 | Disposition: A | Discharge status: Home / Self Care | Payer: BLUE CROSS/BLUE SHIELD | Source: Ambulatory Visit | Attending: Obstetrics & Gynecology | Admitting: Obstetrics & Gynecology

## 2016-07-27 ENCOUNTER — Encounter (HOSPITAL_COMMUNITY): Payer: Self-pay

## 2016-07-27 DIAGNOSIS — Z3682 Encounter for antenatal screening for nuchal translucency: Secondary | ICD-10-CM | POA: Insufficient documentation

## 2016-07-27 DIAGNOSIS — O2691 Pregnancy related conditions, unspecified, first trimester: Secondary | ICD-10-CM | POA: Insufficient documentation

## 2016-07-27 DIAGNOSIS — O34219 Maternal care for unspecified type scar from previous cesarean delivery: Secondary | ICD-10-CM | POA: Diagnosis not present

## 2016-07-27 DIAGNOSIS — O09291 Supervision of pregnancy with other poor reproductive or obstetric history, first trimester: Secondary | ICD-10-CM

## 2016-07-27 DIAGNOSIS — O99419 Diseases of the circulatory system complicating pregnancy, unspecified trimester: Secondary | ICD-10-CM

## 2016-07-27 DIAGNOSIS — Z3A13 13 weeks gestation of pregnancy: Secondary | ICD-10-CM | POA: Diagnosis not present

## 2016-07-27 DIAGNOSIS — Z3490 Encounter for supervision of normal pregnancy, unspecified, unspecified trimester: Secondary | ICD-10-CM

## 2016-07-27 DIAGNOSIS — O99211 Obesity complicating pregnancy, first trimester: Secondary | ICD-10-CM | POA: Diagnosis not present

## 2016-07-27 DIAGNOSIS — O0990 Supervision of high risk pregnancy, unspecified, unspecified trimester: Secondary | ICD-10-CM

## 2016-07-27 DIAGNOSIS — I2510 Atherosclerotic heart disease of native coronary artery without angina pectoris: Secondary | ICD-10-CM | POA: Diagnosis not present

## 2016-07-27 DIAGNOSIS — Z3491 Encounter for supervision of normal pregnancy, unspecified, first trimester: Secondary | ICD-10-CM

## 2016-07-27 DIAGNOSIS — O99411 Diseases of the circulatory system complicating pregnancy, first trimester: Secondary | ICD-10-CM | POA: Diagnosis not present

## 2016-07-27 DIAGNOSIS — E669 Obesity, unspecified: Secondary | ICD-10-CM

## 2016-07-27 DIAGNOSIS — O10919 Unspecified pre-existing hypertension complicating pregnancy, unspecified trimester: Secondary | ICD-10-CM

## 2016-07-27 NOTE — Addendum Note (Signed)
Encounter addended by: Ladora Daniel E on: 07/27/2016 12:23 PM<BR>    Actions taken: Imaging Exam ended

## 2016-07-28 ENCOUNTER — Other Ambulatory Visit (HOSPITAL_COMMUNITY): Payer: Self-pay | Admitting: *Deleted

## 2016-07-28 DIAGNOSIS — Z3689 Encounter for other specified antenatal screening: Secondary | ICD-10-CM

## 2016-07-29 NOTE — Addendum Note (Signed)
Encounter addended by: Lavonia Drafts, MD on: 07/29/2016 11:16 AM<BR>    Actions taken: Problem List modified

## 2016-08-02 NOTE — Progress Notes (Signed)
Cardiology Office Note   Date:  08/03/2016   ID:  Zelphia Glover, DOB 1985-07-13, MRN [id number]  PCP:  Donella Stade, PA-C  Cardiologist:   Minus Breeding, MD   Chief Complaint  Patient presents with  . Coronary Artery Disease      History of Present Illness:  Carol Wright is a 31 y.o. female who presents for evaluation of coronary artery disease. In 2013 when she was 5 months pregnant the patient suffered coronary dissection. She underwent PCI with a bare-metal stent by her report. Her LV function was initially reduced but improved by report.   I last saw her in Sept of last year.  She had an echo with poor images. She had a possible slight reduced wall motion at the apex possibly secondary to previous event. I managed this medically.   She is now [redacted] weeks pregnant.  She is feeling well.  She was recently started on beta blockers.  The patient denies any new symptoms such as chest discomfort, neck or arm discomfort. There has been no new shortness of breath, PND or orthopnea. There have been no reported palpitations, presyncope or syncope.  She is walking on a treadmill routinely.    Past Medical History:  Diagnosis Date  . Depression   . Generalized anxiety disorder   . H/O arterial dissection   . Hepatic steatosis    Via ultrasound.    . Hyperlipidemia   . Myocardial infarction ([place]) 2013  . Obesity (BMI 30-39.9)   . PCOS (polycystic ovarian syndrome)   . Pre-eclampsia     Past Surgical History:  Procedure Laterality Date  . Arterial disection  2013  . Bare metal stent  2013  . CESAREAN SECTION  2013     Current Outpatient Prescriptions  Medication Sig Dispense Refill  . Ascorbic Acid (VITAMIN C) 1000 MG tablet Take 1,000 mg by mouth daily.    Marland Kitchen aspirin 81 MG tablet Take 81 mg by mouth daily.    . cetirizine (ZYRTEC) 10 MG tablet Take 10 mg by mouth daily. Reported on 10/04/2015    . cholecalciferol (VITAMIN D) 1000 units tablet Take 1 tablet (1,000  Units total) by mouth daily. 30 tablet 3  . labetalol (NORMODYNE) 100 MG tablet Take 2 tablets (200 mg total) by mouth 2 (two) times daily. 60 tablet 2  . Prenatal Vit-Fe Fumarate-FA (MULTIVITAMIN-PRENATAL) 27-0.8 MG TABS tablet Take 1 tablet by mouth daily at 12 noon.     No current facility-administered medications for this visit.     Allergies:   Patient has no known allergies.    ROS:  Please see the history of present illness.   Otherwise, review of systems are positive for none.   All other systems are reviewed and negative.    PHYSICAL EXAM: VS:  BP 124/78   Pulse 98   Ht 5\' 3"  (1.6 m)   Wt 215 lb (97.5 kg)   LMP 04/14/2016   SpO2 98%   BMI 38.09 kg/m  , BMI Body mass index is 38.09 kg/m.  GENERAL:  Well appearing NECK:  No jugular venous distention, waveform within normal limits, carotid upstroke brisk and symmetric, no bruits, no thyromegaly LYMPHATICS:  No cervical, inguinal adenopathy LUNGS:  Clear to auscultation bilaterally BACK:  No CVA tenderness HEART:  PMI not displaced or sustained,S1 and S2 within normal limits, no S3, no S4, no clicks, no rubs, no murmurs ABD:  Flat, positive bowel sounds normal in frequency in pitch,  no bruits, no rebound, no guarding, no midline pulsatile mass, no hepatomegaly, no splenomegaly EXT:  2 plus pulses throughout, trace edema, no cyanosis no clubbing    EKG:  EKG is  ordered today. The ekg ordered today demonstrates sinus rhythm, rate 78, left axis deviation, old anteroseptal infarct, no acute ST T wave changes.     Recent Labs: 09/30/2015: ALT 55; BUN 8; Creat 0.60; Potassium 3.8; Sodium 138 06/10/2016: Hemoglobin 12.5; Platelets 213; TSH 0.99    Lipid Panel    Component Value Date/Time   CHOL 172 06/20/2013 0959   TRIG 162 (H) 06/20/2013 0959   HDL 35 (L) 06/20/2013 0959   CHOLHDL 4.9 06/20/2013 0959   VLDL 32 06/20/2013 0959   LDLCALC 105 (H) 06/20/2013 0959      Wt Readings from Last 3 Encounters:  08/03/16  215 lb (97.5 kg)  07/27/16 217 lb (98.4 kg)  07/21/16 215 lb (97.5 kg)      Other studies Reviewed: Additional studies/ records that were reviewed today include: None Review of the above records demonstrates:     ASSESSMENT AND PLAN:  CORONARY ARTERY DISSECTION:   She was previously cautioned and educated that pregnancy would be high risk given her previous dissection.  She chose to become pregnant.  She is being followed closely.  She is now on beta blocker.  The most important issue will be BP control.  She will remain on the beta blocker.  We talked about mild exercise, reasonable weight gain, low salt and keeping a BP diary.  We will follow her closely in this clinic.  She is to let us know or present to the ED with any symptoms.    HTN:  We will have a low threshold to increase her beta blocker as needed.   REDUCED EF:    This was mild.  We will follow this as above.      Current medicines are reviewed at length with the patient today.  The patient does not have concerns regarding medicines.  The following changes have been made:  None  Labs/ tests ordered today include:  None  No orders of the defined types were placed in this encounter.    Disposition:   FU with APP in one month and monthly through her pregnancy.    Signed, Minus Breeding, MD  08/03/2016 1:00 PM    [place]

## 2016-08-03 ENCOUNTER — Ambulatory Visit (INDEPENDENT_AMBULATORY_CARE_PROVIDER_SITE_OTHER): Payer: BLUE CROSS/BLUE SHIELD | Admitting: Cardiology

## 2016-08-03 ENCOUNTER — Encounter: Payer: BLUE CROSS/BLUE SHIELD | Admitting: Obstetrics & Gynecology

## 2016-08-03 ENCOUNTER — Encounter: Payer: Self-pay | Admitting: Cardiology

## 2016-08-03 VITALS — BP 124/78 | HR 98 | Ht 63.0 in | Wt 215.0 lb

## 2016-08-03 DIAGNOSIS — I2542 Coronary artery dissection: Secondary | ICD-10-CM | POA: Diagnosis not present

## 2016-08-03 NOTE — Patient Instructions (Addendum)
Medication Instructions:  Continue current medications  Labwork: None Ordered  Testing/Procedures: None Ordered  Follow-Up: Your physician recommends that you schedule a follow-up appointment in: 1 Month with [name]   Any Other Special Instructions Will Be Listed Below (If Applicable).   If you need a refill on your cardiac medications before your next appointment, please call your pharmacy.

## 2016-08-05 ENCOUNTER — Encounter: Payer: BLUE CROSS/BLUE SHIELD | Admitting: Obstetrics & Gynecology

## 2016-08-06 NOTE — Addendum Note (Signed)
Addended by: Vennie Homans on: 08/06/2016 10:22 AM   Modules accepted: Orders

## 2016-08-07 ENCOUNTER — Other Ambulatory Visit: Payer: Self-pay

## 2016-08-11 ENCOUNTER — Ambulatory Visit (INDEPENDENT_AMBULATORY_CARE_PROVIDER_SITE_OTHER): Payer: BLUE CROSS/BLUE SHIELD | Admitting: Obstetrics & Gynecology

## 2016-08-11 VITALS — BP 120/75 | HR 89 | Wt 218.0 lb

## 2016-08-11 DIAGNOSIS — Z3A15 15 weeks gestation of pregnancy: Secondary | ICD-10-CM | POA: Diagnosis not present

## 2016-08-11 DIAGNOSIS — O99342 Other mental disorders complicating pregnancy, second trimester: Secondary | ICD-10-CM

## 2016-08-11 DIAGNOSIS — F329 Major depressive disorder, single episode, unspecified: Secondary | ICD-10-CM

## 2016-08-11 DIAGNOSIS — O99212 Obesity complicating pregnancy, second trimester: Secondary | ICD-10-CM

## 2016-08-11 DIAGNOSIS — E669 Obesity, unspecified: Secondary | ICD-10-CM

## 2016-08-11 DIAGNOSIS — O0990 Supervision of high risk pregnancy, unspecified, unspecified trimester: Secondary | ICD-10-CM

## 2016-08-11 DIAGNOSIS — I10 Essential (primary) hypertension: Secondary | ICD-10-CM

## 2016-08-11 DIAGNOSIS — E559 Vitamin D deficiency, unspecified: Secondary | ICD-10-CM

## 2016-08-11 DIAGNOSIS — R7989 Other specified abnormal findings of blood chemistry: Secondary | ICD-10-CM

## 2016-08-11 DIAGNOSIS — O10912 Unspecified pre-existing hypertension complicating pregnancy, second trimester: Secondary | ICD-10-CM

## 2016-08-11 NOTE — Addendum Note (Signed)
Addended by: Lyndal Rainbow on: 08/11/2016 03:20 PM   Modules accepted: Orders

## 2016-08-11 NOTE — Progress Notes (Signed)
   PRENATAL VISIT NOTE  Subjective:  Carol Wright is a 31 y.o. G2P0101 at [redacted]w[redacted]d being seen today for ongoing prenatal care.  She is currently monitored for the following issues for this high-risk pregnancy and has Depression; Generalized anxiety disorder; Obesity (BMI 30-39.9); Hx of myocardial infarction; Hepatic steatosis; Maternal coronary artery disease affecting pregnancy, antepartum; Elevated blood pressure; PCO (polycystic ovaries); Hypertension; Infertility management; Supervision of high risk pregnancy, antepartum; Low vitamin D level; Spontaneous dissection of coronary artery; and Chronic hypertension during pregnancy, antepartum on her problem list.  Patient reports no complaints.  Contractions: Not present. Vag. Bleeding: None.  Movement: Absent. Denies leaking of fluid.   The following portions of the patient's history were reviewed and updated as appropriate: allergies, current medications, past family history, past medical history, past social history, past surgical history and problem list. Problem list updated.  Objective:   Vitals:   08/11/16 1436  BP: 120/75  Pulse: 89  Weight: 218 lb (98.9 kg)    Fetal Status: Fetal Heart Rate (bpm): 154   Movement: Absent     General:  Alert, oriented and cooperative. Patient is in no acute distress.  Skin: Skin is warm and dry. No rash noted.   Cardiovascular: Normal heart rate noted  Respiratory: Normal respiratory effort, no problems with respiration noted  Abdomen: Soft, gravid, appropriate for gestational age. Pain/Pressure: Absent     Pelvic:  Cervical exam deferred        Extremities: Normal range of motion.  Edema: None  Mental Status: Normal mood and affect. Normal behavior. Normal judgment and thought content.   Assessment and Plan:  Pregnancy: G2P0101 at [redacted]w[redacted]d  1. [redacted] weeks gestation of pregnancy - Alpha fetoprotein, maternal - Vitamin D (25 hydroxy)  2. Supervision of high risk pregnancy, antepartum -nml First  screen -Pt was supposed to get MFM consult at time of first screen but she did not get one.  Reordered today. -Plan to deliver at [facility] record request for C/S op note.  3. Low vitamin D level Rpt Vit D level Continue oral supplementation  4. Essential hypertension BP stable today  Preterm labor symptoms and general obstetric precautions including but not limited to vaginal bleeding, contractions, leaking of fluid and fetal movement were reviewed in detail with the patient. Please refer to After Visit Summary for other counseling recommendations.  Return in about 3 weeks (around 09/01/2016).   Silas Sacramento, MD

## 2016-08-12 LAB — VITAMIN D 25 HYDROXY (VIT D DEFICIENCY, FRACTURES): VIT D 25 HYDROXY: 29 ng/mL — AB (ref 30–100)

## 2016-08-13 ENCOUNTER — Telehealth: Payer: Self-pay | Admitting: *Deleted

## 2016-08-13 LAB — ALPHA FETOPROTEIN, MATERNAL
AFP: 20.4 ng/mL
Curr Gest Age: 15.7 weeks
MOM FOR AFP: 0.85
OPEN SPINA BIFIDA: NEGATIVE
Osb Risk: 1:22700 {titer}

## 2016-08-13 NOTE — Telephone Encounter (Signed)
-----   Message from Guss Bunde, MD sent at 08/13/2016  6:09 AM EDT ----- Increase Vit D to 2000 IU per day for 6 weeks then redraw.  RN to call

## 2016-08-13 NOTE — Telephone Encounter (Signed)
Pt notified to start Vitamin D 2000 IU daily for the next 6 weeks then repeat Vitamin D levels.

## 2016-08-30 NOTE — Progress Notes (Signed)
Cardiology Office Note    Date:  08/31/2016   ID:  Carol Wright, DOB [birth year], MRN [id number]  PCP:  Donella Stade, PA-C  Cardiologist: Dr. Percival Spanish  Chief Complaint  Patient presents with  . Follow-up    79-month    History of Present Illness:    Carol Wright is a 31 y.o. female with past medical history of SCAD (occuring during pregnancy in 2013 with BMS placed at that time), HTN, and obseity who presents to the office today for 76-month follow-up.   She was examined by Dr. Percival Spanish on 08/03/2016 and reported being 14-weeks pregnant. She denied any recent chest discomfort, dyspnea on exertion, orthopnea, or PND. The importance of good BP control was reviewed thoroughly with the patient.   In talking with the patient today, she reports overall doing well since her last visit. She is now at 18 weeks of her pregnancy and reports this is overall going well. She is anxious to find out the gender in two weeks. Morning sickness has been minimal and her husband does note occasional mood swings.   She denies any recent chest discomfort, dyspnea on exertion, orthopnea, PND, lower extremity edema, or palpitations. She has been checking her blood pressure once daily at home with systolic readings in the 086'V to 120's. Reports tolerating her BP medications well.    Past Medical History:  Diagnosis Date  . Depression   . Generalized anxiety disorder   . H/O arterial dissection   . Hepatic steatosis    Via ultrasound.    . Hyperlipidemia   . Myocardial infarction ([place]) 2013  . Obesity (BMI 30-39.9)   . PCOS (polycystic ovarian syndrome)   . Pre-eclampsia   . Spontaneous dissection of coronary artery    a. occurred in 2013 during pregnany. Underwent placement of BMS per the patient's report.     Past Surgical History:  Procedure Laterality Date  . Arterial disection  2013  . Bare metal stent  2013  . CESAREAN SECTION  2013    Current Medications: Outpatient Medications  Prior to Visit  Medication Sig Dispense Refill  . Ascorbic Acid (VITAMIN C) 1000 MG tablet Take 1,000 mg by mouth daily.    Marland Kitchen aspirin 81 MG tablet Take 81 mg by mouth daily.    . cetirizine (ZYRTEC) 10 MG tablet Take 10 mg by mouth daily. Reported on 10/04/2015    . cholecalciferol (VITAMIN D) 1000 units tablet Take 1 tablet (1,000 Units total) by mouth daily. 30 tablet 3  . labetalol (NORMODYNE) 100 MG tablet Take 2 tablets (200 mg total) by mouth 2 (two) times daily. 60 tablet 2  . Prenatal Vit-Fe Fumarate-FA (MULTIVITAMIN-PRENATAL) 27-0.8 MG TABS tablet Take 1 tablet by mouth daily at 12 noon.     No facility-administered medications prior to visit.      Allergies:   Patient has no known allergies.   Social History   Social History  . Marital status: Married    Spouse name: N/A  . Number of children: 1  . Years of education: N/A   Occupational History  .      Stay at home mother   Social History Main Topics  . Smoking status: Never Smoker  . Smokeless tobacco: Never Used  . Alcohol use No  . Drug use: No  . Sexual activity: Yes    Birth control/ protection: None   Other Topics Concern  . None   Social History Narrative  . None  Family History:  The patient's family history includes Heart attack in her maternal grandfather; Hypertension in her maternal grandmother and mother.   Review of Systems:   Please see the history of present illness.     General:  No chills, fever, night sweats or weight changes. Positive for fatigue. Cardiovascular:  No chest pain, dyspnea on exertion, edema, orthopnea, palpitations, paroxysmal nocturnal dyspnea. Dermatological: No rash, lesions/masses Respiratory: No cough, dyspnea Urologic: No hematuria, dysuria Abdominal:   No nausea, vomiting, diarrhea, bright red blood per rectum, melena, or hematemesis Neurologic:  No visual changes, wkns, changes in mental status. All other systems reviewed and are otherwise negative except as  noted above.   Physical Exam:    VS:  BP 122/82   Pulse 81   Ht 5\' 3"  (1.6 m)   Wt 220 lb (99.8 kg)   LMP 04/14/2016   BMI 38.97 kg/m    General: Well developed, well nourished, gravid female appearing in no acute distress. Head: Normocephalic, atraumatic, sclera non-icteric, no xanthomas, nares are without discharge.  Neck: No carotid bruits. JVD not elevated.  Lungs: Respirations regular and unlabored, without wheezes or rales.  Heart: Regular rate and rhythm. No S3 or S4.  No murmur, no rubs, or gallops appreciated. Abdomen: Soft, non-tender, non-distended with normoactive bowel sounds. Gravid. No rebound/guarding. No obvious abdominal masses. Msk:  Strength and tone appear normal for age. No joint deformities or effusions. Extremities: No clubbing or cyanosis. No edema.  Distal pedal pulses are 2+ bilaterally. Neuro: Alert and oriented X 3. Moves all extremities spontaneously. No focal deficits noted. Psych:  Responds to questions appropriately with a normal affect. Skin: No rashes or lesions noted  Wt Readings from Last 3 Encounters:  08/31/16 220 lb (99.8 kg)  08/11/16 218 lb (98.9 kg)  08/03/16 215 lb (97.5 kg)    Studies/Labs Reviewed:   EKG:  EKG is ordered today.  The ekg ordered today demonstrates NSR, HR 81, with LAD. No acute ST or T-wave changes.   Recent Labs: 09/30/2015: ALT 55; BUN 8; Creat 0.60; Potassium 3.8; Sodium 138 06/10/2016: Hemoglobin 12.5; Platelets 213; TSH 0.99   Lipid Panel    Component Value Date/Time   CHOL 172 06/20/2013 0959   TRIG 162 (H) 06/20/2013 0959   HDL 35 (L) 06/20/2013 0959   CHOLHDL 4.9 06/20/2013 0959   VLDL 32 06/20/2013 0959   LDLCALC 105 (H) 06/20/2013 0959    Additional studies/ records that were reviewed today include:   Echocardiogram: 12/09/2015 Study Conclusions  - Left ventricle: The apex cannot be evaluated. There is very poor   endocardial definition at the LV apex. Suspicion for apical   hypokinesis.  Recommend repeat echo with microbubble contrast   (definity). The cavity size was normal. Wall thickness was   normal. Suspect mildly depressed overall LV systolic function.   Regional wall motion abnormalities cannot be excluded. Left   ventricular diastolic function parameters were normal. - Left atrium: The atrium was mildly dilated.  Assessment:    1. Spontaneous dissection of coronary artery   2. Essential hypertension      Plan:   In order of problems listed above:  1. History of Spontaneous Coronary Artery Dissection - occurred during her pregnancy in 2013 with a BMS placement at that time. She is now 18-weeks pregnant and is being followed closely due to her significant history.  - she denies any recent chest discomfort, dyspnea on exertion, orthopnea, PND, lower extremity edema, or palpitations. We reviewed  that if she had symptoms similar to her presentation in 2013, then she should proceed to the ED for further evaluation.  - continue to follow monthly through her pregnancy as previously discussed with Dr. Percival Spanish.   2. HTN - BP is well-controlled at 122/82 during today's visit. - reports systolic readings in the 161'W - 120's at home.  - continue Labetalol 200mg  BID. Can further titrate as needed.    Medication Adjustments/Labs and Tests Ordered: Current medicines are reviewed at length with the patient today.  Concerns regarding medicines are outlined above.  Medication changes, Labs and Tests ordered today are listed in the Patient Instructions below. Patient Instructions  Medication Instructions:  Your physician recommends that you continue on your current medications as directed. Please refer to the Current Medication list given to you today.  Follow-Up: Your physician recommends that you schedule a follow-up appointment in: 1 month with Dr. Percival Spanish.  Any Other Special Instructions Will Be Listed Below (If Applicable).  If you need a refill on your cardiac  medications before your next appointment, please call your pharmacy.  Signed, Erma Heritage, PA-C  08/31/2016 11:41 AM    [address] [place], [place], [place]  [address] Phone: [phone]; Fax: [phone]  [address] [place], [place] [address] Phone: [phone]

## 2016-08-31 ENCOUNTER — Encounter: Payer: Self-pay | Admitting: Student

## 2016-08-31 ENCOUNTER — Ambulatory Visit (HOSPITAL_COMMUNITY): Payer: BLUE CROSS/BLUE SHIELD

## 2016-08-31 ENCOUNTER — Ambulatory Visit (INDEPENDENT_AMBULATORY_CARE_PROVIDER_SITE_OTHER): Payer: BLUE CROSS/BLUE SHIELD | Admitting: Student

## 2016-08-31 VITALS — BP 122/82 | HR 81 | Ht 63.0 in | Wt 220.0 lb

## 2016-08-31 DIAGNOSIS — I2542 Coronary artery dissection: Secondary | ICD-10-CM | POA: Diagnosis not present

## 2016-08-31 DIAGNOSIS — I10 Essential (primary) hypertension: Secondary | ICD-10-CM | POA: Diagnosis not present

## 2016-08-31 NOTE — Patient Instructions (Signed)
Medication Instructions:  Your physician recommends that you continue on your current medications as directed. Please refer to the Current Medication list given to you today.  Follow-Up: Your physician recommends that you schedule a follow-up appointment in: 1 month with Dr. Percival Spanish.   Any Other Special Instructions Will Be Listed Below (If Applicable).     If you need a refill on your cardiac medications before your next appointment, please call your pharmacy.

## 2016-09-02 ENCOUNTER — Ambulatory Visit (INDEPENDENT_AMBULATORY_CARE_PROVIDER_SITE_OTHER): Payer: BLUE CROSS/BLUE SHIELD | Admitting: Obstetrics & Gynecology

## 2016-09-02 VITALS — BP 122/77 | HR 94 | Wt 220.0 lb

## 2016-09-02 DIAGNOSIS — O0990 Supervision of high risk pregnancy, unspecified, unspecified trimester: Secondary | ICD-10-CM

## 2016-09-02 DIAGNOSIS — E669 Obesity, unspecified: Secondary | ICD-10-CM

## 2016-09-02 DIAGNOSIS — O10919 Unspecified pre-existing hypertension complicating pregnancy, unspecified trimester: Secondary | ICD-10-CM

## 2016-09-02 NOTE — Progress Notes (Signed)
   PRENATAL VISIT NOTE  Subjective:  Carol Wright is a 31 y.o. G2P0101 at [redacted]w[redacted]d being seen today for ongoing prenatal care.  She is currently monitored for the following issues for this low-risk pregnancy and has Depression; Generalized anxiety disorder; Obesity (BMI 30-39.9); Hx of myocardial infarction; Hepatic steatosis; Maternal coronary artery disease affecting pregnancy, antepartum; Elevated blood pressure; PCO (polycystic ovaries); Hypertension; Infertility management; Supervision of high risk pregnancy, antepartum; Low vitamin D level; Spontaneous dissection of coronary artery; and Chronic hypertension during pregnancy, antepartum on her problem list.  Patient reports no complaints.  Contractions: Not present. Vag. Bleeding: None.  Movement: Present. Denies leaking of fluid.   The following portions of the patient's history were reviewed and updated as appropriate: allergies, current medications, past family history, past medical history, past social history, past surgical history and problem list. Problem list updated.  Objective:   Vitals:   09/02/16 0933  BP: 122/77  Pulse: 94  Weight: 220 lb (99.8 kg)    Fetal Status:     Movement: Present     General:  Alert, oriented and cooperative. Patient is in no acute distress.  Skin: Skin is warm and dry. No rash noted.   Cardiovascular: Normal heart rate noted  Respiratory: Normal respiratory effort, no problems with respiration noted  Abdomen: Soft, gravid, appropriate for gestational age. Pain/Pressure: Absent     Pelvic:  Cervical exam deferred        Extremities: Normal range of motion.  Edema: None  Mental Status: Normal mood and affect. Normal behavior. Normal judgment and thought content.   Assessment and Plan:  Pregnancy: G2P0101 at [redacted]w[redacted]d  1. Obesity (BMI 30-39.9)   2. Chronic hypertension during pregnancy, antepartum -on baby asa daily  3. Supervision of high risk pregnancy, antepartum - anatomy u/s  09-08-16  Preterm labor symptoms and general obstetric precautions including but not limited to vaginal bleeding, contractions, leaking of fluid and fetal movement were reviewed in detail with the patient. Please refer to After Visit Summary for other counseling recommendations.  No Follow-up on file.   Emily Filbert, MD

## 2016-09-07 ENCOUNTER — Ambulatory Visit (HOSPITAL_COMMUNITY): Payer: BLUE CROSS/BLUE SHIELD

## 2016-09-08 ENCOUNTER — Ambulatory Visit (HOSPITAL_COMMUNITY)
Admission: RE | Admit: 2016-09-08 | Discharge: 2016-09-08 | Disposition: A | Discharge status: Home / Self Care | Payer: BLUE CROSS/BLUE SHIELD | Source: Ambulatory Visit | Attending: Obstetrics & Gynecology | Admitting: Obstetrics & Gynecology

## 2016-09-08 ENCOUNTER — Encounter (HOSPITAL_COMMUNITY): Payer: Self-pay

## 2016-09-08 ENCOUNTER — Ambulatory Visit (HOSPITAL_COMMUNITY)
Admission: RE | Admit: 2016-09-08 | Discharge: 2016-09-08 | Disposition: A | Discharge status: Home / Self Care | Payer: BLUE CROSS/BLUE SHIELD | Source: Ambulatory Visit | Attending: Obstetrics and Gynecology | Admitting: Obstetrics and Gynecology

## 2016-09-08 ENCOUNTER — Other Ambulatory Visit (HOSPITAL_COMMUNITY): Payer: Self-pay | Admitting: Obstetrics and Gynecology

## 2016-09-08 DIAGNOSIS — Z369 Encounter for antenatal screening, unspecified: Secondary | ICD-10-CM

## 2016-09-08 DIAGNOSIS — O2690 Pregnancy related conditions, unspecified, unspecified trimester: Secondary | ICD-10-CM

## 2016-09-08 DIAGNOSIS — Z3A19 19 weeks gestation of pregnancy: Secondary | ICD-10-CM

## 2016-09-08 DIAGNOSIS — Z3689 Encounter for other specified antenatal screening: Secondary | ICD-10-CM | POA: Insufficient documentation

## 2016-09-08 DIAGNOSIS — O09299 Supervision of pregnancy with other poor reproductive or obstetric history, unspecified trimester: Secondary | ICD-10-CM

## 2016-09-08 DIAGNOSIS — Z79899 Other long term (current) drug therapy: Secondary | ICD-10-CM | POA: Insufficient documentation

## 2016-09-08 DIAGNOSIS — O99212 Obesity complicating pregnancy, second trimester: Secondary | ICD-10-CM | POA: Diagnosis not present

## 2016-09-08 DIAGNOSIS — O10912 Unspecified pre-existing hypertension complicating pregnancy, second trimester: Secondary | ICD-10-CM

## 2016-09-08 DIAGNOSIS — I252 Old myocardial infarction: Secondary | ICD-10-CM | POA: Insufficient documentation

## 2016-09-08 DIAGNOSIS — Z955 Presence of coronary angioplasty implant and graft: Secondary | ICD-10-CM | POA: Diagnosis not present

## 2016-09-08 DIAGNOSIS — E669 Obesity, unspecified: Secondary | ICD-10-CM | POA: Insufficient documentation

## 2016-09-08 DIAGNOSIS — O10012 Pre-existing essential hypertension complicating pregnancy, second trimester: Secondary | ICD-10-CM | POA: Diagnosis not present

## 2016-09-08 DIAGNOSIS — O09292 Supervision of pregnancy with other poor reproductive or obstetric history, second trimester: Secondary | ICD-10-CM | POA: Insufficient documentation

## 2016-09-08 DIAGNOSIS — Z6838 Body mass index (BMI) 38.0-38.9, adult: Secondary | ICD-10-CM | POA: Insufficient documentation

## 2016-09-08 DIAGNOSIS — O34219 Maternal care for unspecified type scar from previous cesarean delivery: Secondary | ICD-10-CM

## 2016-09-08 DIAGNOSIS — Z8679 Personal history of other diseases of the circulatory system: Secondary | ICD-10-CM

## 2016-09-08 DIAGNOSIS — Z98891 History of uterine scar from previous surgery: Secondary | ICD-10-CM

## 2016-09-08 DIAGNOSIS — Z363 Encounter for antenatal screening for malformations: Secondary | ICD-10-CM | POA: Diagnosis not present

## 2016-09-08 NOTE — Progress Notes (Signed)
Maternal Fetal Medicine Consultation  I had the pleasure of seeing your patient Carol Wright for Maternal-Fetal Medicine consultation on 09/08/2016. As you know, Carol Wright is a 31 y.o. 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  Carol Wright has felt well to date this pregnancy and denies chest pain, shortness of breath, jaw or neck pain, nausea, vomiting, bleeding or cramping. She is currently on labetalol 200mg  daily and low-dose aspirin. She is followed by Dr. Kipp Brood of cardiology [facility]) and was seen most recently in May of this year. Her most recent echo was in September of 2017. The study was of poor quality but showed an EF of 55% with a question of mildly depressed LV function.   In Carol Wright's first pregnancy in 2013 she developed upper back pain, followed by chest compression symptoms and neck pain especially in her jaw around 5 months. She was was diagnosed with SCAD after evaluation for VTE was negative.  She had a metal stent placed at that time and was started on daily low dose aspirin, Plavix and Toprol. The plan was to discontinue Plavix a week prior to planned delivery, but unfortunately the day after discontinuation she developed severe preeclampsia and induction of labor was initiated. Given the recent plavix use she was not a candidate for regional anesthesia and had very poor pain control causing worsening hypertension. Given inability to control her blood pressure, she underwent Cesarean delivery (34-35 weeks).  Carol Wright was told by her prior obstetrician in [place] that she was a candidate for TOLAC but does not have her operative report.   The remainder of Carol Wright's medical history is notable for obesity. Her past surgical history is significant for Cesarean  delivery. She takes prenatal vitamins, labetalol, LDASA, vitamin C, Zyrtec, Vitamin D, and prenatal vitamins. She is allergic to no medications. Carol Wright denies alcohol, tobacco or other drug use. Her family history is unremarkable.  Prior to today's consultation Carol Wright had a detailed anatomy ultrasound that showed bilateral urinary tract dilation A1. The spine was not well visualized but all visualized anatomy appeared within normal limits. Please see separate document for fetal ultrasound report. First trimester screen and second trimester MSAFP both low-risk.  We discussed the following issues during her visit today:  1. History of SCAD with BMS in place: Previously counseled by Dr. Lawerance Cruel during a preconception consultation of the limited information the current obstetric literature provides regarding pregnancy outcome after prior SCAD. Carol Wright has a good understanding of this and knows she remains at risk for cardiac events in this pregnancy including recurrent MI and death, particularly given her history of preeclampsia. No longer requires Plavix so inability to receive regional anesthesia should not be of concern.  Recommend: - Close clinical vigilance for signs and symptoms of MI, cardiopulmonary issues - Continued close follow-up with cardiology - Given the history of SCAD, per her cardiologist BP goals are 120/70s. - Echocardiogram now and again in the third trimester - OB Anesthesia consult third trimester - Timing of delivery: We discussed this at length today. Ultimately the timing of delivery will depend on how Carol Wright does as the pregnancy progresses, but at this time we discussed that given her history delivery at 37 weeks may be indicated, but certainly no later than 39 weeks, with a low threshold to deliver earlier for worsening maternal symptoms, increasing  BP or other indications.   2. CHTN with history of preE: Currently on labetalol 200mg  BID with good control. Reviewed risk  of chronic hypertension as well as prior preeclampsia of which she is aware. - Baseline spot urine protein 0.114 - No recent creatinine, recommend obtaining at next prenatal visit - Titrate labetalol to maintain BPs 120/70s per cardiology - Continue LDASA - Serial growth ultrasounds in the third trimester - Antenatal testing to begin at 32 weeks, sooner as indicated  3.  Prior Cesarean delivery: Multiple attempts have been made to obtain Carol Wright's operative reports from [place] without success. We discussed that in general in the [place] low-transverse hysterotomies are performed for the vast majority of deliveries at term. Given her delivery was preterm with some urgency it is possible a vertical hysterotomy was performed, but low-transverse remains the most likely, particularly as she was told she is a candidate for TOLAC previously. We reviewed the risks of TOLAC v. RCD if she is a candidate for TOLAC. We discussed the possibility of tubal ligation which she is not interested in at this time. Should pregnancy complications develop she may reconsider BTL. Currently she favors repeat Cesarean delivery but remains open to TOLAC.  4. Obesity: We discussed recommended weight gain in pregnancy given her BMI. Plan for serial growth ultrasounds and antenatal testing as above.   5. Fetal urinary tract dilation, bilateral UTDA1: We discussed that the findings of today's ultrasound show bilateral urinary tract dilation without high risk features. Carol Wright has previously had a low-risk first trimester screen and no other anomalies or soft markers on today's ultrasound that confer an increased risk for aneuploidy. We discussed a plan to continue to follow the kidneys at future ultrasounds. Will plan for pediatric urology consultation if high risk features develop.   At the conclusion of our discussion Carol Wright indicated that her primary OB Dr. Gala Romney has recommend she deliver at [facility] due to her  cardiac history. I support this recommendation and have arranged for transfer of care visits today. I have advised Eleasha to continue to attend her scheduled visits with Dr. Gala Romney until her transfer of care appointment at the [facility].   Thank you for the opportunity to be a part of the care of Carol Wright. Please contact our office if we can be of further assistance.   I spent approximately 40 minutes with this patient with over 50% of time spent in face-to-face counseling.  Abram Sander, MD Maternal-Fetal Medicine

## 2016-09-15 ENCOUNTER — Telehealth: Payer: Self-pay | Admitting: Cardiology

## 2016-09-15 ENCOUNTER — Ambulatory Visit (INDEPENDENT_AMBULATORY_CARE_PROVIDER_SITE_OTHER): Payer: BLUE CROSS/BLUE SHIELD | Admitting: Obstetrics & Gynecology

## 2016-09-15 VITALS — BP 126/78 | HR 96 | Wt 222.0 lb

## 2016-09-15 DIAGNOSIS — O10912 Unspecified pre-existing hypertension complicating pregnancy, second trimester: Secondary | ICD-10-CM

## 2016-09-15 DIAGNOSIS — O0990 Supervision of high risk pregnancy, unspecified, unspecified trimester: Secondary | ICD-10-CM

## 2016-09-15 DIAGNOSIS — O99212 Obesity complicating pregnancy, second trimester: Secondary | ICD-10-CM

## 2016-09-15 DIAGNOSIS — O0992 Supervision of high risk pregnancy, unspecified, second trimester: Secondary | ICD-10-CM | POA: Diagnosis not present

## 2016-09-15 DIAGNOSIS — I252 Old myocardial infarction: Secondary | ICD-10-CM

## 2016-09-15 DIAGNOSIS — E669 Obesity, unspecified: Secondary | ICD-10-CM | POA: Diagnosis not present

## 2016-09-15 DIAGNOSIS — O10919 Unspecified pre-existing hypertension complicating pregnancy, unspecified trimester: Secondary | ICD-10-CM

## 2016-09-15 NOTE — Telephone Encounter (Signed)
Returned call to patient-patient reports having episode Friday of dizziness, lightheadedness, nausea, and urge to have BM.   Reports having another episode this AM that woke her up out of her sleep.  Reports vomiting this AM.   Reports she is currently asymptomatic, reports episodes last approximately 30 mins and then she is back to baseline.  Denies CP, SOB.   Friday BP 120/91 HR 97, today BP 133/85 HR 99.    Reports she is staying well hydated.  Has contacted OBGYN and has appointment today but they wanted her to contact us as well.   Spoke to [name] PA who recently saw patient in office-recommends EKG at [facility].     Patient unsure if OBGYN can go EKG, will find out and contact us back if not.     Patient also aware if becomes symptomatic-we recommend ER ASAP for evaluation.    Chart review: hx SCAD in pregnancy-currently 20W pregnant.     Patient aware and verbalized understanding.

## 2016-09-15 NOTE — Telephone Encounter (Signed)
New Message  Pt call requesting to speak with RN. Pt states Friday and today she has experienced dizziness, lightheadedness, and sweating. Pt states on Friday she felt like was nausea and had thought she was going to have a bowel movement, but she didn't. Pt states she did have experience the same this morning and did have a bowel movement and threw up. Pt states she did contact her OBGYN and will be seen today. Please call back to discuss

## 2016-09-15 NOTE — Progress Notes (Signed)
   PRENATAL VISIT NOTE  Subjective:  Carol Wright is a 31 y.o. G2P0101 at [redacted]w[redacted]d being seen today for ongoing prenatal care.  She is currently monitored for the following issues for this low-risk pregnancy and has Depression; Generalized anxiety disorder; Obesity (BMI 30-39.9); Hx of myocardial infarction; Hepatic steatosis; Maternal coronary artery disease affecting pregnancy, antepartum; Elevated blood pressure; PCO (polycystic ovaries); Hypertension; Infertility management; Supervision of high risk pregnancy, antepartum; Low vitamin D level; Spontaneous dissection of coronary artery; and Chronic hypertension during pregnancy, antepartum on her problem list.  Patient reports no complaints.  Contractions: Not present. Vag. Bleeding: None.  Movement: Present. Denies leaking of fluid.   The following portions of the patient's history were reviewed and updated as appropriate: allergies, current medications, past family history, past medical history, past social history, past surgical history and problem list. Problem list updated.  Objective:   Vitals:   09/15/16 1408  BP: 126/78  Pulse: 96  Weight: 222 lb (100.7 kg)    Fetal Status: Fetal Heart Rate (bpm): 153   Movement: Present     General:  Alert, oriented and cooperative. Patient is in no acute distress.  Skin: Skin is warm and dry. No rash noted.   Cardiovascular: Normal heart rate noted  Respiratory: Normal respiratory effort, no problems with respiration noted  Abdomen: Soft, gravid, appropriate for gestational age. Pain/Pressure: Absent     Pelvic:  Cervical exam performed        Extremities: Normal range of motion.  Edema: None  Mental Status: Normal mood and affect. Normal behavior. Normal judgment and thought content.   Assessment and Plan:  Pregnancy: G2P0101 at [redacted]w[redacted]d  1. Chronic hypertension during pregnancy, antepartum   2. Hx of myocardial infarction   3. Obesity (BMI 30-39.9)   4. Supervision of high risk  pregnancy, antepartum   Preterm labor symptoms and general obstetric precautions including but not limited to vaginal bleeding, contractions, leaking of fluid and fetal movement were reviewed in detail with the patient. Please refer to After Visit Summary for other counseling recommendations.  No Follow-up on file.   Emily Filbert, MD

## 2016-09-17 ENCOUNTER — Encounter: Payer: BLUE CROSS/BLUE SHIELD | Admitting: Obstetrics & Gynecology

## 2016-09-28 ENCOUNTER — Encounter: Payer: BLUE CROSS/BLUE SHIELD | Admitting: Advanced Practice Midwife

## 2016-10-06 DIAGNOSIS — O34219 Maternal care for unspecified type scar from previous cesarean delivery: Secondary | ICD-10-CM | POA: Diagnosis not present

## 2016-10-06 DIAGNOSIS — O99212 Obesity complicating pregnancy, second trimester: Secondary | ICD-10-CM | POA: Diagnosis not present

## 2016-10-06 DIAGNOSIS — O162 Unspecified maternal hypertension, second trimester: Secondary | ICD-10-CM | POA: Diagnosis not present

## 2016-10-06 DIAGNOSIS — O99412 Diseases of the circulatory system complicating pregnancy, second trimester: Secondary | ICD-10-CM | POA: Diagnosis not present

## 2016-10-06 DIAGNOSIS — I252 Old myocardial infarction: Secondary | ICD-10-CM | POA: Diagnosis not present

## 2016-10-06 DIAGNOSIS — R7989 Other specified abnormal findings of blood chemistry: Secondary | ICD-10-CM | POA: Diagnosis not present

## 2016-10-06 DIAGNOSIS — O283 Abnormal ultrasonic finding on antenatal screening of mother: Secondary | ICD-10-CM | POA: Diagnosis not present

## 2016-10-06 DIAGNOSIS — Z362 Encounter for other antenatal screening follow-up: Secondary | ICD-10-CM | POA: Diagnosis not present

## 2016-10-06 DIAGNOSIS — Z3A23 23 weeks gestation of pregnancy: Secondary | ICD-10-CM | POA: Diagnosis not present

## 2016-10-06 DIAGNOSIS — Z955 Presence of coronary angioplasty implant and graft: Secondary | ICD-10-CM | POA: Diagnosis not present

## 2016-10-06 DIAGNOSIS — O09212 Supervision of pregnancy with history of pre-term labor, second trimester: Secondary | ICD-10-CM | POA: Diagnosis not present

## 2016-10-06 DIAGNOSIS — I340 Nonrheumatic mitral (valve) insufficiency: Secondary | ICD-10-CM | POA: Diagnosis not present

## 2016-10-06 DIAGNOSIS — O10012 Pre-existing essential hypertension complicating pregnancy, second trimester: Secondary | ICD-10-CM | POA: Diagnosis not present

## 2016-10-11 DIAGNOSIS — Z98891 History of uterine scar from previous surgery: Secondary | ICD-10-CM | POA: Insufficient documentation

## 2016-10-11 NOTE — Progress Notes (Signed)
Cardiology Office Note    Date:  10/12/2016   ID:  Carol Wright, DOB 1985-05-21, MRN [id number]  PCP:  Donella Stade, PA-C  Cardiologist: Dr. Percival Spanish   Chief Complaint  Patient presents with  . Follow-up    44-month    History of Present Illness:    Carol Wright is a 31 y.o. female with past medical history of SCAD (occuring during pregnancy in 2013 with BMS placed at that time), HTN, obesity, and current pregnancy who presents to the office today for 78-month follow-up.   She was last examined by myself on 08/31/2016 and reported doing well from a cardiac perspective at that time, denying any recent chest pain or dyspnea on exertion. BP was well controlled at the time of her visit and when checked at home, therefore she was continued on Labetalol 100mg  BID. She is being evaluated by Cardiology monthly during her pregnancy.   In talking with the patient today, she reports overall doing well since her last visit. Now at 24 weeks and her recent anatomy scan confirmed she is having a girl. She did experience episodes of dizziness along with nausea and diarrhea which was documented in phone note on 09/15/2016. She was evaluated by her OB/GYN that day and symptoms were thought to be secondary to a viral illness. She denies any associated palpitations, chest pain, or dyspnea at that time.  Since then, she has not experienced any recurrent symptoms. Her husband who is with her today says she is able to perform yard work and household chores without any exertional symptoms.   She was recently referred to [facility] due to her high-risk pregnancy. An echocardiogram was performed at that time and showed a reduced EF of 40-45% with the apex being thinned and akinetic. Mild MR and trace TR was also noted.  BP had been elevated, therefore her Labetalol was increased from 100mg  BID to 150mg  BID. BP is well-controlled at 126/68 during today's visit.    Past Medical History:  Diagnosis Date  .  Depression   . Generalized anxiety disorder   . H/O arterial dissection   . Hepatic steatosis    Via ultrasound.    . Hyperlipidemia   . Myocardial infarction ([place]) 2013  . Obesity (BMI 30-39.9)   . PCOS (polycystic ovarian syndrome)   . Pre-eclampsia   . Spontaneous dissection of coronary artery    a. occurred in 2013 during pregnany. Underwent placement of BMS per the patient's report.     Past Surgical History:  Procedure Laterality Date  . Arterial disection  2013  . Bare metal stent  2013  . CESAREAN SECTION  2013    Current Medications: Outpatient Medications Prior to Visit  Medication Sig Dispense Refill  . Ascorbic Acid (VITAMIN C) 1000 MG tablet Take 1,000 mg by mouth daily.    Marland Kitchen aspirin 81 MG tablet Take 81 mg by mouth daily.    . cetirizine (ZYRTEC) 10 MG tablet Take 10 mg by mouth daily. Reported on 10/04/2015    . Prenatal Vit-Fe Fumarate-FA (MULTIVITAMIN-PRENATAL) 27-0.8 MG TABS tablet Take 1 tablet by mouth daily at 12 noon.    . cholecalciferol (VITAMIN D) 1000 units tablet Take 1 tablet (1,000 Units total) by mouth daily. (Patient not taking: Reported on 10/12/2016) 30 tablet 3  . labetalol (NORMODYNE) 100 MG tablet Take 2 tablets (200 mg total) by mouth 2 (two) times daily. (Patient not taking: Reported on 10/12/2016) 60 tablet 2   No facility-administered medications  prior to visit.      Allergies:   Patient has no known allergies.   Social History   Social History  . Marital status: Married    Spouse name: N/A  . Number of children: 1  . Years of education: N/A   Occupational History  .      Stay at home mother   Social History Main Topics  . Smoking status: Never Smoker  . Smokeless tobacco: Never Used  . Alcohol use No  . Drug use: No  . Sexual activity: Yes    Birth control/ protection: None   Other Topics Concern  . None   Social History Narrative  . None     Family History:  The patient's family history includes Heart attack in her  maternal grandfather; Hypertension in her maternal grandmother and mother.   Review of Systems:   Please see the history of present illness.     General:  No chills, fever, night sweats or weight changes.  Cardiovascular:  No chest pain, dyspnea on exertion, edema, orthopnea, palpitations, paroxysmal nocturnal dyspnea. Dermatological: No rash, lesions/masses Respiratory: No cough, dyspnea Urologic: No hematuria, dysuria Abdominal:   No nausea, vomiting, diarrhea, bright red blood per rectum, melena, or hematemesis Neurologic:  No visual changes, wkns, changes in mental status. Positive for dizziness (now resolved).   All other systems reviewed and are otherwise negative except as noted above.   Physical Exam:    VS:  BP 128/68   Pulse 88   Ht 5\' 3"  (1.6 m)   Wt 220 lb (99.8 kg)   LMP 04/14/2016   BMI 38.97 kg/m    General: Well developed, well nourished,female appearing in no acute distress. Head: Normocephalic, atraumatic, sclera non-icteric, no xanthomas, nares are without discharge.  Neck: No carotid bruits. JVD not elevated.  Lungs: Respirations regular and unlabored, without wheezes or rales.  Heart: Regular rate and rhythm. No S3 or S4.  No murmur, no rubs, or gallops appreciated. Abdomen: Gravid with normoactive bowel sounds. No hepatomegaly. No rebound/guarding. No obvious abdominal masses. Msk:  Strength and tone appear normal for age. No joint deformities or effusions. Extremities: No clubbing or cyanosis. No lower extremity edema.  Distal pedal pulses are 2+ bilaterally. Neuro: Alert and oriented X 3. Moves all extremities spontaneously. No focal deficits noted. Psych:  Responds to questions appropriately with a normal affect. Skin: No rashes or lesions noted  Wt Readings from Last 3 Encounters:  10/12/16 220 lb (99.8 kg)  09/15/16 222 lb (100.7 kg)  09/08/16 220 lb 12.8 oz (100.2 kg)     Studies/Labs Reviewed:   EKG:  EKG is ordered today.  The ekg ordered  today demonstrates NSR, HR 88, with no acute ST or T-wave changes when compared to prior tracings.   Recent Labs: 06/10/2016: Hemoglobin 12.5; Platelets 213; TSH 0.99   Lipid Panel    Component Value Date/Time   CHOL 172 06/20/2013 0959   TRIG 162 (H) 06/20/2013 0959   HDL 35 (L) 06/20/2013 0959   CHOLHDL 4.9 06/20/2013 0959   VLDL 32 06/20/2013 0959   LDLCALC 105 (H) 06/20/2013 0959    Additional studies/ records that were reviewed today include:   Echocardiogram: 12/09/2015 Study Conclusions  - Left ventricle: The apex cannot be evaluated. There is very poor   endocardial definition at the LV apex. Suspicion for apical   hypokinesis. Recommend repeat echo with microbubble contrast   (definity). The cavity size was normal. Wall thickness was  normal. Suspect mildly depressed overall LV systolic function.   Regional wall motion abnormalities cannot be excluded. Left   ventricular diastolic function parameters were normal. - Left atrium: The atrium was mildly dilated.   TTE: 09/2016 FINDINGS:  LEFT VENTRICLE The left ventricle is moderately dilated. There is normal left ventricular  wall thickness. Left ventricular systolic function is mildly reduced. LV  ejection fraction = 40-45%. Left ventricular filling pattern is  indeterminate. There are regional wall motion abnormalities as specified  below. Entire apex is thinned and akinetic. -  RIGHT VENTRICLE The right ventricle is normal in size and function.  LEFT ATRIUM The left atrium is moderately dilated.  RIGHT ATRIUM  Right atrial size is normal. - AORTIC VALVE Structurally normal aortic valve. There is no aortic stenosis. There is no  aortic regurgitation. - MITRAL VALVE There is mild mitral valve thickening. There is mild mitral regurgitation.  The mitral regurgitant jet is eccentrically directed. - TRICUSPID VALVE The tricuspid valve is normal in structure and function. There is trace  tricuspid  regurgitation. There was insufficient TR detected to calculate RV  systolic pressure. - PULMONIC VALVE The pulmonic valve is not well visualized. There is no significant pulmonic  regurgitation. - ARTERIES The aortic root is normal size. - VENOUS Pulmonary venous flow pattern is normal. IVC size was normal. - EFFUSION There is no pericardial effusion.  Assessment:    1. Spontaneous dissection of coronary artery   2. Essential hypertension   3. Cardiomyopathy, unspecified type ([place])      Plan:   In order of problems listed above:  1. History of Spontaneous Coronary Artery Dissection - occuring during pregnancy in 2013 with BMS placed at that time (unknown artery).  - she denies any recent chest pain, palpitations, or dyspnea. Did have an episode of nausea and diarrhea within the past month which spontaneously resolved within a few hours and seemed most consistent with a viral illness.  - EKG today shows NSR with no acute ST or T-wave changes.  - continue ASA and BB therapy.   2. HTN - BP is well-controlled at 128/68 during today's visit. - continue Labetalol 150mg  BID.   3. Cardiomyopathy - recent echo on 10/06/2016 showed a reduced EF of 40-45% with the apex being thinned and akinetic. Similar wall motion abnormalities as noted on her prior echo in 11/2015. - no evidence of volume overload by physical examination.  - continue Labetalol 150mg  BID. Consider switch to Toprol-XL or Coreg following pregnancy and breastfeeding.    Medication Adjustments/Labs and Tests Ordered: Current medicines are reviewed at length with the patient today.  Concerns regarding medicines are outlined above.  Medication changes, Labs and Tests ordered today are listed in the Patient Instructions below. Patient Instructions  Medications: No changes  Follow-Up: Keep your follow up with Dr. Percival Spanish on 8/30  If you need a refill on your cardiac medications before your next appointment, please  call your pharmacy.     Signed, Erma Heritage, PA-C  10/12/2016 2:33 PM    [place], [place], [place]  [address] Phone: [phone]; Fax: [phone]  [address], [place],  [address] Phone: [phone]

## 2016-10-12 ENCOUNTER — Ambulatory Visit (INDEPENDENT_AMBULATORY_CARE_PROVIDER_SITE_OTHER): Payer: BLUE CROSS/BLUE SHIELD | Admitting: Student

## 2016-10-12 ENCOUNTER — Encounter: Payer: BLUE CROSS/BLUE SHIELD | Admitting: Advanced Practice Midwife

## 2016-10-12 ENCOUNTER — Encounter: Payer: Self-pay | Admitting: Student

## 2016-10-12 VITALS — BP 128/68 | HR 88 | Ht 63.0 in | Wt 220.0 lb

## 2016-10-12 DIAGNOSIS — I10 Essential (primary) hypertension: Secondary | ICD-10-CM

## 2016-10-12 DIAGNOSIS — I2542 Coronary artery dissection: Secondary | ICD-10-CM | POA: Diagnosis not present

## 2016-10-12 DIAGNOSIS — I429 Cardiomyopathy, unspecified: Secondary | ICD-10-CM | POA: Diagnosis not present

## 2016-10-12 MED ORDER — LABETALOL HCL 100 MG PO TABS: 150.0000 mg | ORAL_TABLET | Freq: Two times a day (BID) | ORAL | 3 refills | Status: DC

## 2016-10-12 NOTE — Patient Instructions (Signed)
Medications: No changes  Follow-Up: Keep your follow up with Dr. Percival Spanish on 8/30   If you need a refill on your cardiac medications before your next appointment, please call your pharmacy.

## 2016-10-20 DIAGNOSIS — Z6841 Body Mass Index (BMI) 40.0 and over, adult: Secondary | ICD-10-CM | POA: Diagnosis not present

## 2016-10-20 DIAGNOSIS — I252 Old myocardial infarction: Secondary | ICD-10-CM | POA: Diagnosis not present

## 2016-10-20 DIAGNOSIS — Z3A25 25 weeks gestation of pregnancy: Secondary | ICD-10-CM | POA: Diagnosis not present

## 2016-10-20 DIAGNOSIS — O132 Gestational [pregnancy-induced] hypertension without significant proteinuria, second trimester: Secondary | ICD-10-CM | POA: Diagnosis not present

## 2016-10-20 DIAGNOSIS — O34219 Maternal care for unspecified type scar from previous cesarean delivery: Secondary | ICD-10-CM | POA: Diagnosis not present

## 2016-10-20 DIAGNOSIS — E669 Obesity, unspecified: Secondary | ICD-10-CM | POA: Diagnosis not present

## 2016-10-20 DIAGNOSIS — F329 Major depressive disorder, single episode, unspecified: Secondary | ICD-10-CM | POA: Diagnosis not present

## 2016-10-20 DIAGNOSIS — I2542 Coronary artery dissection: Secondary | ICD-10-CM | POA: Diagnosis not present

## 2016-10-20 DIAGNOSIS — O09892 Supervision of other high risk pregnancies, second trimester: Secondary | ICD-10-CM | POA: Diagnosis not present

## 2016-10-20 DIAGNOSIS — O99212 Obesity complicating pregnancy, second trimester: Secondary | ICD-10-CM | POA: Diagnosis not present

## 2016-10-20 DIAGNOSIS — R931 Abnormal findings on diagnostic imaging of heart and coronary circulation: Secondary | ICD-10-CM | POA: Diagnosis not present

## 2016-10-20 DIAGNOSIS — O10919 Unspecified pre-existing hypertension complicating pregnancy, unspecified trimester: Secondary | ICD-10-CM | POA: Diagnosis not present

## 2016-10-20 DIAGNOSIS — O10912 Unspecified pre-existing hypertension complicating pregnancy, second trimester: Secondary | ICD-10-CM | POA: Diagnosis not present

## 2016-10-20 DIAGNOSIS — O99342 Other mental disorders complicating pregnancy, second trimester: Secondary | ICD-10-CM | POA: Diagnosis not present

## 2016-10-21 DIAGNOSIS — R9431 Abnormal electrocardiogram [ECG] [EKG]: Secondary | ICD-10-CM | POA: Diagnosis not present

## 2016-11-02 DIAGNOSIS — O99412 Diseases of the circulatory system complicating pregnancy, second trimester: Secondary | ICD-10-CM | POA: Diagnosis not present

## 2016-11-02 DIAGNOSIS — O09292 Supervision of pregnancy with other poor reproductive or obstetric history, second trimester: Secondary | ICD-10-CM | POA: Diagnosis not present

## 2016-11-02 DIAGNOSIS — Z3A27 27 weeks gestation of pregnancy: Secondary | ICD-10-CM | POA: Diagnosis not present

## 2016-11-02 DIAGNOSIS — O09212 Supervision of pregnancy with history of pre-term labor, second trimester: Secondary | ICD-10-CM | POA: Diagnosis not present

## 2016-11-02 DIAGNOSIS — O162 Unspecified maternal hypertension, second trimester: Secondary | ICD-10-CM | POA: Diagnosis not present

## 2016-11-02 DIAGNOSIS — I252 Old myocardial infarction: Secondary | ICD-10-CM | POA: Diagnosis not present

## 2016-11-02 DIAGNOSIS — O10012 Pre-existing essential hypertension complicating pregnancy, second trimester: Secondary | ICD-10-CM | POA: Diagnosis not present

## 2016-11-12 ENCOUNTER — Ambulatory Visit: Payer: BLUE CROSS/BLUE SHIELD | Admitting: Cardiology

## 2016-11-17 DIAGNOSIS — I252 Old myocardial infarction: Secondary | ICD-10-CM | POA: Diagnosis not present

## 2016-11-17 DIAGNOSIS — I2542 Coronary artery dissection: Secondary | ICD-10-CM | POA: Diagnosis not present

## 2016-11-30 DIAGNOSIS — I2510 Atherosclerotic heart disease of native coronary artery without angina pectoris: Secondary | ICD-10-CM | POA: Diagnosis not present

## 2016-11-30 DIAGNOSIS — O358XX0 Maternal care for other (suspected) fetal abnormality and damage, not applicable or unspecified: Secondary | ICD-10-CM | POA: Diagnosis not present

## 2016-11-30 DIAGNOSIS — E559 Vitamin D deficiency, unspecified: Secondary | ICD-10-CM | POA: Diagnosis not present

## 2016-11-30 DIAGNOSIS — I252 Old myocardial infarction: Secondary | ICD-10-CM | POA: Diagnosis not present

## 2016-11-30 DIAGNOSIS — O99413 Diseases of the circulatory system complicating pregnancy, third trimester: Secondary | ICD-10-CM | POA: Diagnosis not present

## 2016-11-30 DIAGNOSIS — I2542 Coronary artery dissection: Secondary | ICD-10-CM | POA: Diagnosis not present

## 2016-11-30 DIAGNOSIS — O09213 Supervision of pregnancy with history of pre-term labor, third trimester: Secondary | ICD-10-CM | POA: Diagnosis not present

## 2016-11-30 DIAGNOSIS — O09293 Supervision of pregnancy with other poor reproductive or obstetric history, third trimester: Secondary | ICD-10-CM | POA: Diagnosis not present

## 2016-11-30 DIAGNOSIS — O0993 Supervision of high risk pregnancy, unspecified, third trimester: Secondary | ICD-10-CM | POA: Diagnosis not present

## 2016-11-30 DIAGNOSIS — O99013 Anemia complicating pregnancy, third trimester: Secondary | ICD-10-CM | POA: Diagnosis not present

## 2016-11-30 DIAGNOSIS — D649 Anemia, unspecified: Secondary | ICD-10-CM | POA: Diagnosis not present

## 2016-11-30 DIAGNOSIS — O283 Abnormal ultrasonic finding on antenatal screening of mother: Secondary | ICD-10-CM | POA: Diagnosis not present

## 2016-11-30 DIAGNOSIS — O10013 Pre-existing essential hypertension complicating pregnancy, third trimester: Secondary | ICD-10-CM | POA: Diagnosis not present

## 2016-11-30 DIAGNOSIS — O34219 Maternal care for unspecified type scar from previous cesarean delivery: Secondary | ICD-10-CM | POA: Diagnosis not present

## 2016-11-30 DIAGNOSIS — O99213 Obesity complicating pregnancy, third trimester: Secondary | ICD-10-CM | POA: Diagnosis not present

## 2016-11-30 DIAGNOSIS — Z23 Encounter for immunization: Secondary | ICD-10-CM | POA: Diagnosis not present

## 2016-11-30 DIAGNOSIS — E669 Obesity, unspecified: Secondary | ICD-10-CM | POA: Diagnosis not present

## 2016-11-30 DIAGNOSIS — O99283 Endocrine, nutritional and metabolic diseases complicating pregnancy, third trimester: Secondary | ICD-10-CM | POA: Diagnosis not present

## 2016-11-30 DIAGNOSIS — Z6838 Body mass index (BMI) 38.0-38.9, adult: Secondary | ICD-10-CM | POA: Diagnosis not present

## 2016-12-07 DIAGNOSIS — I2510 Atherosclerotic heart disease of native coronary artery without angina pectoris: Secondary | ICD-10-CM | POA: Diagnosis not present

## 2016-12-07 DIAGNOSIS — O10913 Unspecified pre-existing hypertension complicating pregnancy, third trimester: Secondary | ICD-10-CM | POA: Diagnosis not present

## 2016-12-07 DIAGNOSIS — O99343 Other mental disorders complicating pregnancy, third trimester: Secondary | ICD-10-CM | POA: Diagnosis not present

## 2016-12-07 DIAGNOSIS — Z6838 Body mass index (BMI) 38.0-38.9, adult: Secondary | ICD-10-CM | POA: Diagnosis not present

## 2016-12-07 DIAGNOSIS — F411 Generalized anxiety disorder: Secondary | ICD-10-CM | POA: Diagnosis not present

## 2016-12-07 DIAGNOSIS — E669 Obesity, unspecified: Secondary | ICD-10-CM | POA: Diagnosis not present

## 2016-12-07 DIAGNOSIS — O99213 Obesity complicating pregnancy, third trimester: Secondary | ICD-10-CM | POA: Diagnosis not present

## 2016-12-07 DIAGNOSIS — I252 Old myocardial infarction: Secondary | ICD-10-CM | POA: Diagnosis not present

## 2016-12-07 DIAGNOSIS — Z6830 Body mass index (BMI) 30.0-30.9, adult: Secondary | ICD-10-CM | POA: Diagnosis not present

## 2016-12-07 DIAGNOSIS — O0993 Supervision of high risk pregnancy, unspecified, third trimester: Secondary | ICD-10-CM | POA: Diagnosis not present

## 2016-12-07 DIAGNOSIS — O34219 Maternal care for unspecified type scar from previous cesarean delivery: Secondary | ICD-10-CM | POA: Diagnosis not present

## 2016-12-07 DIAGNOSIS — O99413 Diseases of the circulatory system complicating pregnancy, third trimester: Secondary | ICD-10-CM | POA: Diagnosis not present

## 2016-12-07 DIAGNOSIS — O99013 Anemia complicating pregnancy, third trimester: Secondary | ICD-10-CM | POA: Diagnosis not present

## 2016-12-07 DIAGNOSIS — O10013 Pre-existing essential hypertension complicating pregnancy, third trimester: Secondary | ICD-10-CM | POA: Diagnosis not present

## 2016-12-07 DIAGNOSIS — Z3A32 32 weeks gestation of pregnancy: Secondary | ICD-10-CM | POA: Diagnosis not present

## 2016-12-14 DIAGNOSIS — F329 Major depressive disorder, single episode, unspecified: Secondary | ICD-10-CM | POA: Diagnosis not present

## 2016-12-14 DIAGNOSIS — F411 Generalized anxiety disorder: Secondary | ICD-10-CM | POA: Diagnosis not present

## 2016-12-14 DIAGNOSIS — E669 Obesity, unspecified: Secondary | ICD-10-CM | POA: Diagnosis not present

## 2016-12-14 DIAGNOSIS — O09293 Supervision of pregnancy with other poor reproductive or obstetric history, third trimester: Secondary | ICD-10-CM | POA: Diagnosis not present

## 2016-12-14 DIAGNOSIS — O99413 Diseases of the circulatory system complicating pregnancy, third trimester: Secondary | ICD-10-CM | POA: Diagnosis not present

## 2016-12-14 DIAGNOSIS — O0993 Supervision of high risk pregnancy, unspecified, third trimester: Secondary | ICD-10-CM | POA: Diagnosis not present

## 2016-12-14 DIAGNOSIS — Z3A33 33 weeks gestation of pregnancy: Secondary | ICD-10-CM | POA: Diagnosis not present

## 2016-12-14 DIAGNOSIS — O09213 Supervision of pregnancy with history of pre-term labor, third trimester: Secondary | ICD-10-CM | POA: Diagnosis not present

## 2016-12-14 DIAGNOSIS — I2510 Atherosclerotic heart disease of native coronary artery without angina pectoris: Secondary | ICD-10-CM | POA: Diagnosis not present

## 2016-12-14 DIAGNOSIS — O99343 Other mental disorders complicating pregnancy, third trimester: Secondary | ICD-10-CM | POA: Diagnosis not present

## 2016-12-14 DIAGNOSIS — O10913 Unspecified pre-existing hypertension complicating pregnancy, third trimester: Secondary | ICD-10-CM | POA: Diagnosis not present

## 2016-12-14 DIAGNOSIS — O99213 Obesity complicating pregnancy, third trimester: Secondary | ICD-10-CM | POA: Diagnosis not present

## 2016-12-14 DIAGNOSIS — I252 Old myocardial infarction: Secondary | ICD-10-CM | POA: Diagnosis not present

## 2016-12-14 DIAGNOSIS — K760 Fatty (change of) liver, not elsewhere classified: Secondary | ICD-10-CM | POA: Diagnosis not present

## 2016-12-14 DIAGNOSIS — I2542 Coronary artery dissection: Secondary | ICD-10-CM | POA: Diagnosis not present

## 2016-12-14 DIAGNOSIS — O99613 Diseases of the digestive system complicating pregnancy, third trimester: Secondary | ICD-10-CM | POA: Diagnosis not present

## 2016-12-14 DIAGNOSIS — O99013 Anemia complicating pregnancy, third trimester: Secondary | ICD-10-CM | POA: Diagnosis not present

## 2016-12-14 DIAGNOSIS — O34219 Maternal care for unspecified type scar from previous cesarean delivery: Secondary | ICD-10-CM | POA: Diagnosis not present

## 2016-12-17 DIAGNOSIS — O99419 Diseases of the circulatory system complicating pregnancy, unspecified trimester: Secondary | ICD-10-CM | POA: Diagnosis not present

## 2016-12-17 DIAGNOSIS — I2542 Coronary artery dissection: Secondary | ICD-10-CM | POA: Diagnosis not present

## 2016-12-17 DIAGNOSIS — I2510 Atherosclerotic heart disease of native coronary artery without angina pectoris: Secondary | ICD-10-CM | POA: Diagnosis not present

## 2016-12-17 DIAGNOSIS — I071 Rheumatic tricuspid insufficiency: Secondary | ICD-10-CM | POA: Diagnosis not present

## 2016-12-21 DIAGNOSIS — O99343 Other mental disorders complicating pregnancy, third trimester: Secondary | ICD-10-CM | POA: Diagnosis not present

## 2016-12-21 DIAGNOSIS — D649 Anemia, unspecified: Secondary | ICD-10-CM | POA: Diagnosis not present

## 2016-12-21 DIAGNOSIS — O133 Gestational [pregnancy-induced] hypertension without significant proteinuria, third trimester: Secondary | ICD-10-CM | POA: Diagnosis not present

## 2016-12-21 DIAGNOSIS — O99213 Obesity complicating pregnancy, third trimester: Secondary | ICD-10-CM | POA: Diagnosis not present

## 2016-12-21 DIAGNOSIS — O99413 Diseases of the circulatory system complicating pregnancy, third trimester: Secondary | ICD-10-CM | POA: Diagnosis not present

## 2016-12-21 DIAGNOSIS — O99013 Anemia complicating pregnancy, third trimester: Secondary | ICD-10-CM | POA: Diagnosis not present

## 2016-12-21 DIAGNOSIS — F329 Major depressive disorder, single episode, unspecified: Secondary | ICD-10-CM | POA: Diagnosis not present

## 2016-12-21 DIAGNOSIS — O09293 Supervision of pregnancy with other poor reproductive or obstetric history, third trimester: Secondary | ICD-10-CM | POA: Diagnosis not present

## 2016-12-21 DIAGNOSIS — E669 Obesity, unspecified: Secondary | ICD-10-CM | POA: Diagnosis not present

## 2016-12-21 DIAGNOSIS — O10913 Unspecified pre-existing hypertension complicating pregnancy, third trimester: Secondary | ICD-10-CM | POA: Diagnosis not present

## 2016-12-21 DIAGNOSIS — Z3A34 34 weeks gestation of pregnancy: Secondary | ICD-10-CM | POA: Diagnosis not present

## 2016-12-21 DIAGNOSIS — I252 Old myocardial infarction: Secondary | ICD-10-CM | POA: Diagnosis not present

## 2016-12-21 DIAGNOSIS — O34219 Maternal care for unspecified type scar from previous cesarean delivery: Secondary | ICD-10-CM | POA: Diagnosis not present

## 2016-12-21 DIAGNOSIS — O09213 Supervision of pregnancy with history of pre-term labor, third trimester: Secondary | ICD-10-CM | POA: Diagnosis not present

## 2016-12-28 DIAGNOSIS — O99213 Obesity complicating pregnancy, third trimester: Secondary | ICD-10-CM | POA: Diagnosis not present

## 2016-12-28 DIAGNOSIS — Z3A35 35 weeks gestation of pregnancy: Secondary | ICD-10-CM | POA: Diagnosis not present

## 2016-12-28 DIAGNOSIS — Z6838 Body mass index (BMI) 38.0-38.9, adult: Secondary | ICD-10-CM | POA: Diagnosis not present

## 2016-12-28 DIAGNOSIS — O99343 Other mental disorders complicating pregnancy, third trimester: Secondary | ICD-10-CM | POA: Diagnosis not present

## 2016-12-28 DIAGNOSIS — I2510 Atherosclerotic heart disease of native coronary artery without angina pectoris: Secondary | ICD-10-CM | POA: Diagnosis not present

## 2016-12-28 DIAGNOSIS — O09293 Supervision of pregnancy with other poor reproductive or obstetric history, third trimester: Secondary | ICD-10-CM | POA: Diagnosis not present

## 2016-12-28 DIAGNOSIS — O10013 Pre-existing essential hypertension complicating pregnancy, third trimester: Secondary | ICD-10-CM | POA: Diagnosis not present

## 2016-12-28 DIAGNOSIS — O34219 Maternal care for unspecified type scar from previous cesarean delivery: Secondary | ICD-10-CM | POA: Diagnosis not present

## 2016-12-28 DIAGNOSIS — O99413 Diseases of the circulatory system complicating pregnancy, third trimester: Secondary | ICD-10-CM | POA: Diagnosis not present

## 2016-12-28 DIAGNOSIS — O321XX0 Maternal care for breech presentation, not applicable or unspecified: Secondary | ICD-10-CM | POA: Diagnosis not present

## 2016-12-28 DIAGNOSIS — D649 Anemia, unspecified: Secondary | ICD-10-CM | POA: Diagnosis not present

## 2016-12-28 DIAGNOSIS — F411 Generalized anxiety disorder: Secondary | ICD-10-CM | POA: Diagnosis not present

## 2016-12-28 DIAGNOSIS — I252 Old myocardial infarction: Secondary | ICD-10-CM | POA: Diagnosis not present

## 2016-12-28 DIAGNOSIS — I2542 Coronary artery dissection: Secondary | ICD-10-CM | POA: Diagnosis not present

## 2016-12-28 DIAGNOSIS — O09213 Supervision of pregnancy with history of pre-term labor, third trimester: Secondary | ICD-10-CM | POA: Diagnosis not present

## 2016-12-28 DIAGNOSIS — O99013 Anemia complicating pregnancy, third trimester: Secondary | ICD-10-CM | POA: Diagnosis not present

## 2016-12-28 DIAGNOSIS — F329 Major depressive disorder, single episode, unspecified: Secondary | ICD-10-CM | POA: Diagnosis not present

## 2017-01-04 DIAGNOSIS — I2510 Atherosclerotic heart disease of native coronary artery without angina pectoris: Secondary | ICD-10-CM | POA: Diagnosis not present

## 2017-01-04 DIAGNOSIS — O99013 Anemia complicating pregnancy, third trimester: Secondary | ICD-10-CM | POA: Diagnosis not present

## 2017-01-04 DIAGNOSIS — O26613 Liver and biliary tract disorders in pregnancy, third trimester: Secondary | ICD-10-CM | POA: Diagnosis not present

## 2017-01-04 DIAGNOSIS — O99213 Obesity complicating pregnancy, third trimester: Secondary | ICD-10-CM | POA: Diagnosis not present

## 2017-01-04 DIAGNOSIS — O34219 Maternal care for unspecified type scar from previous cesarean delivery: Secondary | ICD-10-CM | POA: Diagnosis not present

## 2017-01-04 DIAGNOSIS — E669 Obesity, unspecified: Secondary | ICD-10-CM | POA: Diagnosis not present

## 2017-01-04 DIAGNOSIS — F411 Generalized anxiety disorder: Secondary | ICD-10-CM | POA: Diagnosis not present

## 2017-01-04 DIAGNOSIS — O9943 Diseases of the circulatory system complicating the puerperium: Secondary | ICD-10-CM | POA: Diagnosis not present

## 2017-01-04 DIAGNOSIS — K760 Fatty (change of) liver, not elsewhere classified: Secondary | ICD-10-CM | POA: Diagnosis not present

## 2017-01-04 DIAGNOSIS — Z8759 Personal history of other complications of pregnancy, childbirth and the puerperium: Secondary | ICD-10-CM | POA: Diagnosis not present

## 2017-01-04 DIAGNOSIS — F329 Major depressive disorder, single episode, unspecified: Secondary | ICD-10-CM | POA: Diagnosis not present

## 2017-01-04 DIAGNOSIS — O99343 Other mental disorders complicating pregnancy, third trimester: Secondary | ICD-10-CM | POA: Diagnosis not present

## 2017-01-04 DIAGNOSIS — O09213 Supervision of pregnancy with history of pre-term labor, third trimester: Secondary | ICD-10-CM | POA: Diagnosis not present

## 2017-01-04 DIAGNOSIS — I252 Old myocardial infarction: Secondary | ICD-10-CM | POA: Diagnosis not present

## 2017-01-04 DIAGNOSIS — I2542 Coronary artery dissection: Secondary | ICD-10-CM | POA: Diagnosis not present

## 2017-01-04 DIAGNOSIS — O328XX0 Maternal care for other malpresentation of fetus, not applicable or unspecified: Secondary | ICD-10-CM | POA: Diagnosis not present

## 2017-01-04 DIAGNOSIS — O10013 Pre-existing essential hypertension complicating pregnancy, third trimester: Secondary | ICD-10-CM | POA: Diagnosis not present

## 2017-01-04 DIAGNOSIS — O10913 Unspecified pre-existing hypertension complicating pregnancy, third trimester: Secondary | ICD-10-CM | POA: Diagnosis not present

## 2017-01-04 DIAGNOSIS — O0993 Supervision of high risk pregnancy, unspecified, third trimester: Secondary | ICD-10-CM | POA: Diagnosis not present

## 2017-01-05 DIAGNOSIS — I252 Old myocardial infarction: Secondary | ICD-10-CM | POA: Diagnosis not present

## 2017-01-05 DIAGNOSIS — I2542 Coronary artery dissection: Secondary | ICD-10-CM | POA: Diagnosis not present

## 2017-01-05 DIAGNOSIS — O10913 Unspecified pre-existing hypertension complicating pregnancy, third trimester: Secondary | ICD-10-CM | POA: Diagnosis not present

## 2017-01-05 DIAGNOSIS — Z3A26 26 weeks gestation of pregnancy: Secondary | ICD-10-CM | POA: Diagnosis not present

## 2017-01-07 DIAGNOSIS — O99344 Other mental disorders complicating childbirth: Secondary | ICD-10-CM | POA: Diagnosis not present

## 2017-01-07 DIAGNOSIS — O99214 Obesity complicating childbirth: Secondary | ICD-10-CM | POA: Diagnosis not present

## 2017-01-07 DIAGNOSIS — O1002 Pre-existing essential hypertension complicating childbirth: Secondary | ICD-10-CM | POA: Diagnosis not present

## 2017-01-07 DIAGNOSIS — O9942 Diseases of the circulatory system complicating childbirth: Secondary | ICD-10-CM | POA: Diagnosis not present

## 2017-01-07 DIAGNOSIS — O10013 Pre-existing essential hypertension complicating pregnancy, third trimester: Secondary | ICD-10-CM | POA: Diagnosis not present

## 2017-01-07 DIAGNOSIS — Z23 Encounter for immunization: Secondary | ICD-10-CM | POA: Diagnosis not present

## 2017-01-07 DIAGNOSIS — Z3689 Encounter for other specified antenatal screening: Secondary | ICD-10-CM | POA: Diagnosis not present

## 2017-01-07 DIAGNOSIS — O99413 Diseases of the circulatory system complicating pregnancy, third trimester: Secondary | ICD-10-CM | POA: Diagnosis not present

## 2017-01-07 DIAGNOSIS — Z3A37 37 weeks gestation of pregnancy: Secondary | ICD-10-CM | POA: Diagnosis not present

## 2017-01-07 DIAGNOSIS — O99213 Obesity complicating pregnancy, third trimester: Secondary | ICD-10-CM | POA: Diagnosis not present

## 2017-01-07 DIAGNOSIS — O34219 Maternal care for unspecified type scar from previous cesarean delivery: Secondary | ICD-10-CM | POA: Diagnosis not present

## 2017-01-07 DIAGNOSIS — O09293 Supervision of pregnancy with other poor reproductive or obstetric history, third trimester: Secondary | ICD-10-CM | POA: Diagnosis not present

## 2017-01-07 DIAGNOSIS — K760 Fatty (change of) liver, not elsewhere classified: Secondary | ICD-10-CM | POA: Diagnosis not present

## 2017-01-07 DIAGNOSIS — D649 Anemia, unspecified: Secondary | ICD-10-CM | POA: Diagnosis not present

## 2017-01-07 DIAGNOSIS — N858 Other specified noninflammatory disorders of uterus: Secondary | ICD-10-CM | POA: Diagnosis not present

## 2017-01-07 DIAGNOSIS — Z7982 Long term (current) use of aspirin: Secondary | ICD-10-CM | POA: Diagnosis not present

## 2017-01-07 DIAGNOSIS — Z955 Presence of coronary angioplasty implant and graft: Secondary | ICD-10-CM | POA: Diagnosis not present

## 2017-01-07 DIAGNOSIS — F329 Major depressive disorder, single episode, unspecified: Secondary | ICD-10-CM | POA: Diagnosis not present

## 2017-01-07 DIAGNOSIS — F411 Generalized anxiety disorder: Secondary | ICD-10-CM | POA: Diagnosis not present

## 2017-01-07 DIAGNOSIS — I252 Old myocardial infarction: Secondary | ICD-10-CM | POA: Diagnosis not present

## 2017-01-07 DIAGNOSIS — O34211 Maternal care for low transverse scar from previous cesarean delivery: Secondary | ICD-10-CM | POA: Diagnosis not present

## 2017-01-07 DIAGNOSIS — Z8759 Personal history of other complications of pregnancy, childbirth and the puerperium: Secondary | ICD-10-CM | POA: Diagnosis not present

## 2017-01-07 DIAGNOSIS — Z6841 Body Mass Index (BMI) 40.0 and over, adult: Secondary | ICD-10-CM | POA: Diagnosis not present

## 2017-01-07 DIAGNOSIS — O9902 Anemia complicating childbirth: Secondary | ICD-10-CM | POA: Diagnosis not present

## 2017-01-07 DIAGNOSIS — E669 Obesity, unspecified: Secondary | ICD-10-CM | POA: Diagnosis not present

## 2017-01-07 DIAGNOSIS — Z058 Observation and evaluation of newborn for other specified suspected condition ruled out: Secondary | ICD-10-CM | POA: Diagnosis not present

## 2017-01-08 DIAGNOSIS — O34211 Maternal care for low transverse scar from previous cesarean delivery: Secondary | ICD-10-CM | POA: Diagnosis not present

## 2017-01-08 DIAGNOSIS — O1002 Pre-existing essential hypertension complicating childbirth: Secondary | ICD-10-CM | POA: Diagnosis not present

## 2017-01-08 DIAGNOSIS — Z3A37 37 weeks gestation of pregnancy: Secondary | ICD-10-CM | POA: Diagnosis not present

## 2017-01-08 DIAGNOSIS — O9942 Diseases of the circulatory system complicating childbirth: Secondary | ICD-10-CM | POA: Diagnosis not present

## 2017-01-08 DIAGNOSIS — Z8759 Personal history of other complications of pregnancy, childbirth and the puerperium: Secondary | ICD-10-CM | POA: Diagnosis not present

## 2017-01-10 DIAGNOSIS — O9942 Diseases of the circulatory system complicating childbirth: Secondary | ICD-10-CM | POA: Diagnosis not present

## 2017-01-10 DIAGNOSIS — O1002 Pre-existing essential hypertension complicating childbirth: Secondary | ICD-10-CM | POA: Diagnosis not present

## 2017-04-22 ENCOUNTER — Encounter (HOSPITAL_COMMUNITY): Payer: Self-pay

## 2017-05-07 DIAGNOSIS — K838 Other specified diseases of biliary tract: Secondary | ICD-10-CM | POA: Diagnosis not present

## 2017-05-07 DIAGNOSIS — K819 Cholecystitis, unspecified: Secondary | ICD-10-CM | POA: Diagnosis not present

## 2017-05-07 DIAGNOSIS — K8020 Calculus of gallbladder without cholecystitis without obstruction: Secondary | ICD-10-CM | POA: Diagnosis not present

## 2017-05-07 DIAGNOSIS — R079 Chest pain, unspecified: Secondary | ICD-10-CM | POA: Diagnosis not present

## 2017-05-07 DIAGNOSIS — K769 Liver disease, unspecified: Secondary | ICD-10-CM | POA: Diagnosis not present

## 2017-05-07 DIAGNOSIS — R5383 Other fatigue: Secondary | ICD-10-CM | POA: Diagnosis not present

## 2017-05-07 DIAGNOSIS — I10 Essential (primary) hypertension: Secondary | ICD-10-CM | POA: Diagnosis not present

## 2017-05-07 DIAGNOSIS — R0602 Shortness of breath: Secondary | ICD-10-CM | POA: Diagnosis not present

## 2017-05-07 DIAGNOSIS — I252 Old myocardial infarction: Secondary | ICD-10-CM | POA: Diagnosis not present

## 2017-05-07 DIAGNOSIS — R109 Unspecified abdominal pain: Secondary | ICD-10-CM | POA: Diagnosis not present

## 2017-05-07 DIAGNOSIS — R12 Heartburn: Secondary | ICD-10-CM | POA: Diagnosis not present

## 2017-05-07 DIAGNOSIS — K828 Other specified diseases of gallbladder: Secondary | ICD-10-CM | POA: Diagnosis not present

## 2017-05-07 DIAGNOSIS — K8010 Calculus of gallbladder with chronic cholecystitis without obstruction: Secondary | ICD-10-CM | POA: Diagnosis not present

## 2017-05-07 DIAGNOSIS — R609 Edema, unspecified: Secondary | ICD-10-CM | POA: Diagnosis not present

## 2017-05-07 DIAGNOSIS — R932 Abnormal findings on diagnostic imaging of liver and biliary tract: Secondary | ICD-10-CM | POA: Diagnosis not present

## 2017-05-07 DIAGNOSIS — Z79899 Other long term (current) drug therapy: Secondary | ICD-10-CM | POA: Diagnosis not present

## 2017-05-07 DIAGNOSIS — N281 Cyst of kidney, acquired: Secondary | ICD-10-CM | POA: Diagnosis not present

## 2017-05-07 DIAGNOSIS — Z955 Presence of coronary angioplasty implant and graft: Secondary | ICD-10-CM | POA: Diagnosis not present

## 2017-05-07 DIAGNOSIS — K760 Fatty (change of) liver, not elsewhere classified: Secondary | ICD-10-CM | POA: Diagnosis not present

## 2017-05-08 DIAGNOSIS — R109 Unspecified abdominal pain: Secondary | ICD-10-CM | POA: Diagnosis not present

## 2017-05-08 DIAGNOSIS — K8010 Calculus of gallbladder with chronic cholecystitis without obstruction: Secondary | ICD-10-CM | POA: Diagnosis not present

## 2017-05-08 DIAGNOSIS — R609 Edema, unspecified: Secondary | ICD-10-CM | POA: Diagnosis not present

## 2017-05-08 DIAGNOSIS — K819 Cholecystitis, unspecified: Secondary | ICD-10-CM | POA: Diagnosis not present

## 2017-05-08 DIAGNOSIS — R932 Abnormal findings on diagnostic imaging of liver and biliary tract: Secondary | ICD-10-CM | POA: Diagnosis not present

## 2017-05-08 DIAGNOSIS — K8020 Calculus of gallbladder without cholecystitis without obstruction: Secondary | ICD-10-CM | POA: Diagnosis not present

## 2017-05-12 MED ORDER — GENERIC EXTERNAL MEDICATION
Status: DC
Start: ? — End: 2017-05-12

## 2017-05-12 MED ORDER — HYDROCODONE-ACETAMINOPHEN 5-325 MG PO TABS
ORAL_TABLET | ORAL | Status: DC
Start: ? — End: 2017-05-12

## 2017-05-12 MED ORDER — HYDROMORPHONE HCL 1 MG/ML IJ SOLN
0.50 | INTRAMUSCULAR | Status: DC
Start: ? — End: 2017-05-12

## 2017-05-12 MED ORDER — DEXTROSE IN LACTATED RINGERS 5 % IV SOLN
INTRAVENOUS | Status: DC
Start: ? — End: 2017-05-12

## 2017-05-12 MED ORDER — HEPARIN SODIUM (PORCINE) 5000 UNIT/ML IJ SOLN
INTRAMUSCULAR | Status: DC
Start: 2017-05-10 — End: 2017-05-12

## 2017-05-12 MED ORDER — GENERIC EXTERNAL MEDICATION
3.38 | Status: DC
Start: 2017-05-10 — End: 2017-05-12

## 2017-05-12 MED ORDER — SODIUM CHLORIDE 0.9 % IV SOLN
INTRAVENOUS | Status: DC
Start: ? — End: 2017-05-12

## 2017-05-12 MED ORDER — METOPROLOL TARTRATE 5 MG/5ML IV SOLN
5.00 | INTRAVENOUS | Status: DC
Start: ? — End: 2017-05-12

## 2017-05-31 DIAGNOSIS — R945 Abnormal results of liver function studies: Secondary | ICD-10-CM | POA: Diagnosis not present

## 2017-05-31 DIAGNOSIS — K760 Fatty (change of) liver, not elsewhere classified: Secondary | ICD-10-CM | POA: Diagnosis not present

## 2017-10-26 DIAGNOSIS — J301 Allergic rhinitis due to pollen: Secondary | ICD-10-CM | POA: Diagnosis not present

## 2017-10-26 DIAGNOSIS — J3089 Other allergic rhinitis: Secondary | ICD-10-CM | POA: Diagnosis not present

## 2017-10-26 DIAGNOSIS — L209 Atopic dermatitis, unspecified: Secondary | ICD-10-CM | POA: Diagnosis not present

## 2017-10-26 DIAGNOSIS — J3081 Allergic rhinitis due to animal (cat) (dog) hair and dander: Secondary | ICD-10-CM | POA: Diagnosis not present

## 2017-11-07 DIAGNOSIS — J3081 Allergic rhinitis due to animal (cat) (dog) hair and dander: Secondary | ICD-10-CM | POA: Diagnosis not present

## 2017-11-07 DIAGNOSIS — J3089 Other allergic rhinitis: Secondary | ICD-10-CM | POA: Diagnosis not present

## 2017-11-07 DIAGNOSIS — J301 Allergic rhinitis due to pollen: Secondary | ICD-10-CM | POA: Diagnosis not present

## 2017-11-16 DIAGNOSIS — J3081 Allergic rhinitis due to animal (cat) (dog) hair and dander: Secondary | ICD-10-CM | POA: Diagnosis not present

## 2017-11-16 DIAGNOSIS — J3089 Other allergic rhinitis: Secondary | ICD-10-CM | POA: Diagnosis not present

## 2017-11-16 DIAGNOSIS — J301 Allergic rhinitis due to pollen: Secondary | ICD-10-CM | POA: Diagnosis not present

## 2017-11-18 DIAGNOSIS — J3089 Other allergic rhinitis: Secondary | ICD-10-CM | POA: Diagnosis not present

## 2017-11-18 DIAGNOSIS — J301 Allergic rhinitis due to pollen: Secondary | ICD-10-CM | POA: Diagnosis not present

## 2017-11-18 DIAGNOSIS — J3081 Allergic rhinitis due to animal (cat) (dog) hair and dander: Secondary | ICD-10-CM | POA: Diagnosis not present

## 2017-11-22 DIAGNOSIS — J301 Allergic rhinitis due to pollen: Secondary | ICD-10-CM | POA: Diagnosis not present

## 2017-11-22 DIAGNOSIS — J3089 Other allergic rhinitis: Secondary | ICD-10-CM | POA: Diagnosis not present

## 2017-11-22 DIAGNOSIS — J3081 Allergic rhinitis due to animal (cat) (dog) hair and dander: Secondary | ICD-10-CM | POA: Diagnosis not present

## 2017-11-24 DIAGNOSIS — J3081 Allergic rhinitis due to animal (cat) (dog) hair and dander: Secondary | ICD-10-CM | POA: Diagnosis not present

## 2017-11-24 DIAGNOSIS — J3089 Other allergic rhinitis: Secondary | ICD-10-CM | POA: Diagnosis not present

## 2017-11-24 DIAGNOSIS — J301 Allergic rhinitis due to pollen: Secondary | ICD-10-CM | POA: Diagnosis not present

## 2017-11-29 DIAGNOSIS — J3089 Other allergic rhinitis: Secondary | ICD-10-CM | POA: Diagnosis not present

## 2017-11-29 DIAGNOSIS — J3081 Allergic rhinitis due to animal (cat) (dog) hair and dander: Secondary | ICD-10-CM | POA: Diagnosis not present

## 2017-11-29 DIAGNOSIS — J301 Allergic rhinitis due to pollen: Secondary | ICD-10-CM | POA: Diagnosis not present

## 2017-12-01 DIAGNOSIS — J3089 Other allergic rhinitis: Secondary | ICD-10-CM | POA: Diagnosis not present

## 2017-12-01 DIAGNOSIS — J3081 Allergic rhinitis due to animal (cat) (dog) hair and dander: Secondary | ICD-10-CM | POA: Diagnosis not present

## 2017-12-01 DIAGNOSIS — J301 Allergic rhinitis due to pollen: Secondary | ICD-10-CM | POA: Diagnosis not present

## 2017-12-06 DIAGNOSIS — J301 Allergic rhinitis due to pollen: Secondary | ICD-10-CM | POA: Diagnosis not present

## 2017-12-06 DIAGNOSIS — J3081 Allergic rhinitis due to animal (cat) (dog) hair and dander: Secondary | ICD-10-CM | POA: Diagnosis not present

## 2017-12-06 DIAGNOSIS — J3089 Other allergic rhinitis: Secondary | ICD-10-CM | POA: Diagnosis not present

## 2017-12-08 DIAGNOSIS — J3081 Allergic rhinitis due to animal (cat) (dog) hair and dander: Secondary | ICD-10-CM | POA: Diagnosis not present

## 2017-12-08 DIAGNOSIS — J3089 Other allergic rhinitis: Secondary | ICD-10-CM | POA: Diagnosis not present

## 2017-12-08 DIAGNOSIS — J301 Allergic rhinitis due to pollen: Secondary | ICD-10-CM | POA: Diagnosis not present

## 2017-12-13 DIAGNOSIS — J3089 Other allergic rhinitis: Secondary | ICD-10-CM | POA: Diagnosis not present

## 2017-12-13 DIAGNOSIS — J3081 Allergic rhinitis due to animal (cat) (dog) hair and dander: Secondary | ICD-10-CM | POA: Diagnosis not present

## 2017-12-13 DIAGNOSIS — J301 Allergic rhinitis due to pollen: Secondary | ICD-10-CM | POA: Diagnosis not present

## 2017-12-16 DIAGNOSIS — J3081 Allergic rhinitis due to animal (cat) (dog) hair and dander: Secondary | ICD-10-CM | POA: Diagnosis not present

## 2017-12-16 DIAGNOSIS — J301 Allergic rhinitis due to pollen: Secondary | ICD-10-CM | POA: Diagnosis not present

## 2017-12-16 DIAGNOSIS — J3089 Other allergic rhinitis: Secondary | ICD-10-CM | POA: Diagnosis not present

## 2017-12-20 DIAGNOSIS — J3089 Other allergic rhinitis: Secondary | ICD-10-CM | POA: Diagnosis not present

## 2017-12-20 DIAGNOSIS — J3081 Allergic rhinitis due to animal (cat) (dog) hair and dander: Secondary | ICD-10-CM | POA: Diagnosis not present

## 2017-12-20 DIAGNOSIS — J301 Allergic rhinitis due to pollen: Secondary | ICD-10-CM | POA: Diagnosis not present

## 2017-12-23 DIAGNOSIS — J3089 Other allergic rhinitis: Secondary | ICD-10-CM | POA: Diagnosis not present

## 2017-12-23 DIAGNOSIS — J3081 Allergic rhinitis due to animal (cat) (dog) hair and dander: Secondary | ICD-10-CM | POA: Diagnosis not present

## 2017-12-23 DIAGNOSIS — J301 Allergic rhinitis due to pollen: Secondary | ICD-10-CM | POA: Diagnosis not present

## 2017-12-27 ENCOUNTER — Encounter: Payer: Self-pay | Admitting: Obstetrics & Gynecology

## 2017-12-27 ENCOUNTER — Ambulatory Visit (INDEPENDENT_AMBULATORY_CARE_PROVIDER_SITE_OTHER): Payer: Medicaid Other | Admitting: Obstetrics & Gynecology

## 2017-12-27 VITALS — BP 134/86 | HR 84 | Ht 63.0 in | Wt 223.0 lb

## 2017-12-27 DIAGNOSIS — Z3202 Encounter for pregnancy test, result negative: Secondary | ICD-10-CM

## 2017-12-27 DIAGNOSIS — N926 Irregular menstruation, unspecified: Secondary | ICD-10-CM

## 2017-12-27 DIAGNOSIS — Z0000 Encounter for general adult medical examination without abnormal findings: Secondary | ICD-10-CM | POA: Diagnosis not present

## 2017-12-27 DIAGNOSIS — Z01419 Encounter for gynecological examination (general) (routine) without abnormal findings: Secondary | ICD-10-CM

## 2017-12-27 DIAGNOSIS — Z8679 Personal history of other diseases of the circulatory system: Secondary | ICD-10-CM

## 2017-12-27 LAB — POCT URINE PREGNANCY: PREG TEST UR: NEGATIVE

## 2017-12-27 NOTE — Progress Notes (Signed)
Last pap 09/30/15- negative

## 2017-12-27 NOTE — Progress Notes (Signed)
Subjective:     Carol Wright is a 32 y.o. female here for a routine exam.  Current complaints: thinking about having one more child.  Wants to deliver at [facility] once we are post move.  Will need to establish care with Dr. Percival Spanish and cardiology again.    Gynecologic History Patient's last menstrual period was 11/12/2017. Contraception: oral progesterone-only contraceptive Last Pap: 2017. Results were: normal Last mammogram: n/a  Obstetric History OB History  Gravida Para Term Preterm AB Living  2 2 0 2   2  SAB TAB Ectopic Multiple Live Births               # Outcome Date GA Lbr Len/2nd Weight Sex Delivery Anes PTL Lv  2 Preterm      CS-LTranv     1 Preterm      CS-LTranv        The following portions of the patient's history were reviewed and updated as appropriate: allergies, current medications, past family history, past medical history, past social history, past surgical history and problem list.  Review of Systems Pertinent items noted in HPI and remainder of comprehensive ROS otherwise negative.    Objective:      Vitals:   12/27/17 1118  BP: 134/86  Pulse: 84  Weight: 223 lb (101.2 kg)  Height: 5\' 3"  (1.6 m)   Vitals:  WNL General appearance: alert, cooperative and no distress  HEENT: Normocephalic, without obvious abnormality, atraumatic Eyes: negative Throat: lips, mucosa, and tongue normal; teeth and gums normal  Respiratory: Clear to auscultation bilaterally  CV: Regular rate and rhythm  Breasts:  Normal appearance, no masses or tenderness, no nipple retraction or dimpling  GI: Soft, non-tender; bowel sounds normal; no masses,  no organomegaly  GU: External Genitalia:  Tanner V, no lesion Urethra:  No prolapse   Vagina: Pink, normal rugae, no blood or discharge  Cervix: No CMT, no lesion  Uterus:  Normal size and contour, non tender  Adnexa: Normal, no masses, non tender  Musculoskeletal: No edema, redness or tenderness in the calves or thighs  Skin:  No lesions or rash  Lymphatic: Axillary adenopathy: none     Psychiatric: Normal mood and behavior    Assessment:    Healthy female exam.    Plan:   1.  Pap in 2020 2.  Conceptual counseling with our MFM 3.  Reestablish care with [facility], Dr. Percival Spanish. 4.  Continue weight loss to be near normal BMI of conception 5.  UPT is negative.  Patient did skip her menstrual cycle this month but this is not uncommon with progesterone only pills.  Should take UPT's each month if not having menstrual cycle.

## 2017-12-28 DIAGNOSIS — J301 Allergic rhinitis due to pollen: Secondary | ICD-10-CM | POA: Diagnosis not present

## 2017-12-28 DIAGNOSIS — J3089 Other allergic rhinitis: Secondary | ICD-10-CM | POA: Diagnosis not present

## 2017-12-28 DIAGNOSIS — J3081 Allergic rhinitis due to animal (cat) (dog) hair and dander: Secondary | ICD-10-CM | POA: Diagnosis not present

## 2017-12-30 DIAGNOSIS — J3081 Allergic rhinitis due to animal (cat) (dog) hair and dander: Secondary | ICD-10-CM | POA: Diagnosis not present

## 2017-12-30 DIAGNOSIS — J3089 Other allergic rhinitis: Secondary | ICD-10-CM | POA: Diagnosis not present

## 2017-12-30 DIAGNOSIS — J301 Allergic rhinitis due to pollen: Secondary | ICD-10-CM | POA: Diagnosis not present

## 2018-01-03 DIAGNOSIS — J3081 Allergic rhinitis due to animal (cat) (dog) hair and dander: Secondary | ICD-10-CM | POA: Diagnosis not present

## 2018-01-03 DIAGNOSIS — J301 Allergic rhinitis due to pollen: Secondary | ICD-10-CM | POA: Diagnosis not present

## 2018-01-03 DIAGNOSIS — J3089 Other allergic rhinitis: Secondary | ICD-10-CM | POA: Diagnosis not present

## 2018-01-05 DIAGNOSIS — J3081 Allergic rhinitis due to animal (cat) (dog) hair and dander: Secondary | ICD-10-CM | POA: Diagnosis not present

## 2018-01-05 DIAGNOSIS — J301 Allergic rhinitis due to pollen: Secondary | ICD-10-CM | POA: Diagnosis not present

## 2018-01-05 DIAGNOSIS — J3089 Other allergic rhinitis: Secondary | ICD-10-CM | POA: Diagnosis not present

## 2018-01-10 DIAGNOSIS — J3081 Allergic rhinitis due to animal (cat) (dog) hair and dander: Secondary | ICD-10-CM | POA: Diagnosis not present

## 2018-01-10 DIAGNOSIS — J3089 Other allergic rhinitis: Secondary | ICD-10-CM | POA: Diagnosis not present

## 2018-01-10 DIAGNOSIS — J301 Allergic rhinitis due to pollen: Secondary | ICD-10-CM | POA: Diagnosis not present

## 2018-01-13 ENCOUNTER — Encounter

## 2018-01-13 DIAGNOSIS — J301 Allergic rhinitis due to pollen: Secondary | ICD-10-CM | POA: Diagnosis not present

## 2018-01-13 DIAGNOSIS — J3081 Allergic rhinitis due to animal (cat) (dog) hair and dander: Secondary | ICD-10-CM | POA: Diagnosis not present

## 2018-01-13 DIAGNOSIS — J3089 Other allergic rhinitis: Secondary | ICD-10-CM | POA: Diagnosis not present

## 2018-01-18 DIAGNOSIS — J3089 Other allergic rhinitis: Secondary | ICD-10-CM | POA: Diagnosis not present

## 2018-01-18 DIAGNOSIS — J3081 Allergic rhinitis due to animal (cat) (dog) hair and dander: Secondary | ICD-10-CM | POA: Diagnosis not present

## 2018-01-18 DIAGNOSIS — J301 Allergic rhinitis due to pollen: Secondary | ICD-10-CM | POA: Diagnosis not present

## 2018-01-20 DIAGNOSIS — J3089 Other allergic rhinitis: Secondary | ICD-10-CM | POA: Diagnosis not present

## 2018-01-20 DIAGNOSIS — J3081 Allergic rhinitis due to animal (cat) (dog) hair and dander: Secondary | ICD-10-CM | POA: Diagnosis not present

## 2018-01-20 DIAGNOSIS — J301 Allergic rhinitis due to pollen: Secondary | ICD-10-CM | POA: Diagnosis not present

## 2018-01-24 DIAGNOSIS — J3089 Other allergic rhinitis: Secondary | ICD-10-CM | POA: Diagnosis not present

## 2018-01-24 DIAGNOSIS — J3081 Allergic rhinitis due to animal (cat) (dog) hair and dander: Secondary | ICD-10-CM | POA: Diagnosis not present

## 2018-01-24 DIAGNOSIS — J301 Allergic rhinitis due to pollen: Secondary | ICD-10-CM | POA: Diagnosis not present

## 2018-01-27 DIAGNOSIS — J301 Allergic rhinitis due to pollen: Secondary | ICD-10-CM | POA: Diagnosis not present

## 2018-01-27 DIAGNOSIS — J3089 Other allergic rhinitis: Secondary | ICD-10-CM | POA: Diagnosis not present

## 2018-01-27 DIAGNOSIS — J3081 Allergic rhinitis due to animal (cat) (dog) hair and dander: Secondary | ICD-10-CM | POA: Diagnosis not present

## 2018-01-31 DIAGNOSIS — J301 Allergic rhinitis due to pollen: Secondary | ICD-10-CM | POA: Diagnosis not present

## 2018-01-31 DIAGNOSIS — J3089 Other allergic rhinitis: Secondary | ICD-10-CM | POA: Diagnosis not present

## 2018-01-31 DIAGNOSIS — J3081 Allergic rhinitis due to animal (cat) (dog) hair and dander: Secondary | ICD-10-CM | POA: Diagnosis not present

## 2018-02-02 DIAGNOSIS — J301 Allergic rhinitis due to pollen: Secondary | ICD-10-CM | POA: Diagnosis not present

## 2018-02-02 DIAGNOSIS — J3081 Allergic rhinitis due to animal (cat) (dog) hair and dander: Secondary | ICD-10-CM | POA: Diagnosis not present

## 2018-02-02 DIAGNOSIS — J3089 Other allergic rhinitis: Secondary | ICD-10-CM | POA: Diagnosis not present

## 2018-02-07 DIAGNOSIS — J3081 Allergic rhinitis due to animal (cat) (dog) hair and dander: Secondary | ICD-10-CM | POA: Diagnosis not present

## 2018-02-07 DIAGNOSIS — J3089 Other allergic rhinitis: Secondary | ICD-10-CM | POA: Diagnosis not present

## 2018-02-07 DIAGNOSIS — J301 Allergic rhinitis due to pollen: Secondary | ICD-10-CM | POA: Diagnosis not present

## 2018-02-09 DIAGNOSIS — J301 Allergic rhinitis due to pollen: Secondary | ICD-10-CM | POA: Diagnosis not present

## 2018-02-09 DIAGNOSIS — J3089 Other allergic rhinitis: Secondary | ICD-10-CM | POA: Diagnosis not present

## 2018-02-09 DIAGNOSIS — J3081 Allergic rhinitis due to animal (cat) (dog) hair and dander: Secondary | ICD-10-CM | POA: Diagnosis not present

## 2018-02-14 DIAGNOSIS — J3081 Allergic rhinitis due to animal (cat) (dog) hair and dander: Secondary | ICD-10-CM | POA: Diagnosis not present

## 2018-02-14 DIAGNOSIS — J301 Allergic rhinitis due to pollen: Secondary | ICD-10-CM | POA: Diagnosis not present

## 2018-02-14 DIAGNOSIS — J3089 Other allergic rhinitis: Secondary | ICD-10-CM | POA: Diagnosis not present

## 2018-02-17 DIAGNOSIS — J3081 Allergic rhinitis due to animal (cat) (dog) hair and dander: Secondary | ICD-10-CM | POA: Diagnosis not present

## 2018-02-17 DIAGNOSIS — J301 Allergic rhinitis due to pollen: Secondary | ICD-10-CM | POA: Diagnosis not present

## 2018-02-17 DIAGNOSIS — J3089 Other allergic rhinitis: Secondary | ICD-10-CM | POA: Diagnosis not present

## 2018-02-21 DIAGNOSIS — J3081 Allergic rhinitis due to animal (cat) (dog) hair and dander: Secondary | ICD-10-CM | POA: Diagnosis not present

## 2018-02-21 DIAGNOSIS — J301 Allergic rhinitis due to pollen: Secondary | ICD-10-CM | POA: Diagnosis not present

## 2018-02-21 DIAGNOSIS — J3089 Other allergic rhinitis: Secondary | ICD-10-CM | POA: Diagnosis not present

## 2018-02-28 ENCOUNTER — Encounter (HOSPITAL_COMMUNITY): Payer: Medicaid Other

## 2018-02-28 DIAGNOSIS — J301 Allergic rhinitis due to pollen: Secondary | ICD-10-CM | POA: Diagnosis not present

## 2018-02-28 DIAGNOSIS — J3081 Allergic rhinitis due to animal (cat) (dog) hair and dander: Secondary | ICD-10-CM | POA: Diagnosis not present

## 2018-02-28 DIAGNOSIS — J3089 Other allergic rhinitis: Secondary | ICD-10-CM | POA: Diagnosis not present

## 2018-03-07 DIAGNOSIS — J3081 Allergic rhinitis due to animal (cat) (dog) hair and dander: Secondary | ICD-10-CM | POA: Diagnosis not present

## 2018-03-07 DIAGNOSIS — J301 Allergic rhinitis due to pollen: Secondary | ICD-10-CM | POA: Diagnosis not present

## 2018-03-07 DIAGNOSIS — J3089 Other allergic rhinitis: Secondary | ICD-10-CM | POA: Diagnosis not present

## 2018-03-14 DIAGNOSIS — J301 Allergic rhinitis due to pollen: Secondary | ICD-10-CM | POA: Diagnosis not present

## 2018-03-14 DIAGNOSIS — J3089 Other allergic rhinitis: Secondary | ICD-10-CM | POA: Diagnosis not present

## 2018-03-14 DIAGNOSIS — J3081 Allergic rhinitis due to animal (cat) (dog) hair and dander: Secondary | ICD-10-CM | POA: Diagnosis not present

## 2018-03-21 DIAGNOSIS — J3089 Other allergic rhinitis: Secondary | ICD-10-CM | POA: Diagnosis not present

## 2018-03-21 DIAGNOSIS — J301 Allergic rhinitis due to pollen: Secondary | ICD-10-CM | POA: Diagnosis not present

## 2018-03-21 DIAGNOSIS — J3081 Allergic rhinitis due to animal (cat) (dog) hair and dander: Secondary | ICD-10-CM | POA: Diagnosis not present

## 2018-03-30 DIAGNOSIS — J301 Allergic rhinitis due to pollen: Secondary | ICD-10-CM | POA: Diagnosis not present

## 2018-03-30 DIAGNOSIS — J3081 Allergic rhinitis due to animal (cat) (dog) hair and dander: Secondary | ICD-10-CM | POA: Diagnosis not present

## 2018-03-30 DIAGNOSIS — J3089 Other allergic rhinitis: Secondary | ICD-10-CM | POA: Diagnosis not present

## 2018-04-04 DIAGNOSIS — J301 Allergic rhinitis due to pollen: Secondary | ICD-10-CM | POA: Diagnosis not present

## 2018-04-04 DIAGNOSIS — J3081 Allergic rhinitis due to animal (cat) (dog) hair and dander: Secondary | ICD-10-CM | POA: Diagnosis not present

## 2018-04-04 DIAGNOSIS — J3089 Other allergic rhinitis: Secondary | ICD-10-CM | POA: Diagnosis not present

## 2018-04-11 DIAGNOSIS — J3089 Other allergic rhinitis: Secondary | ICD-10-CM | POA: Diagnosis not present

## 2018-04-11 DIAGNOSIS — J3081 Allergic rhinitis due to animal (cat) (dog) hair and dander: Secondary | ICD-10-CM | POA: Diagnosis not present

## 2018-04-11 DIAGNOSIS — I2510 Atherosclerotic heart disease of native coronary artery without angina pectoris: Secondary | ICD-10-CM | POA: Diagnosis not present

## 2018-04-11 DIAGNOSIS — J301 Allergic rhinitis due to pollen: Secondary | ICD-10-CM | POA: Diagnosis not present

## 2018-04-12 ENCOUNTER — Encounter: Payer: Self-pay | Admitting: Physician Assistant

## 2018-04-12 DIAGNOSIS — I2510 Atherosclerotic heart disease of native coronary artery without angina pectoris: Secondary | ICD-10-CM | POA: Diagnosis not present

## 2018-04-18 DIAGNOSIS — J3081 Allergic rhinitis due to animal (cat) (dog) hair and dander: Secondary | ICD-10-CM | POA: Diagnosis not present

## 2018-04-18 DIAGNOSIS — J301 Allergic rhinitis due to pollen: Secondary | ICD-10-CM | POA: Diagnosis not present

## 2018-04-18 DIAGNOSIS — J3089 Other allergic rhinitis: Secondary | ICD-10-CM | POA: Diagnosis not present

## 2018-04-22 ENCOUNTER — Telehealth: Payer: Self-pay | Admitting: Physician Assistant

## 2018-04-22 DIAGNOSIS — I253 Aneurysm of heart: Secondary | ICD-10-CM

## 2018-04-22 NOTE — Telephone Encounter (Signed)
I received a copy of echo. Are you seeing cardiology? EF is normal but a little decreased. 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 It is new from 2017 echo.  Most important thing is to keep BP controlled.

## 2018-04-25 DIAGNOSIS — J3081 Allergic rhinitis due to animal (cat) (dog) hair and dander: Secondary | ICD-10-CM | POA: Diagnosis not present

## 2018-04-25 DIAGNOSIS — J301 Allergic rhinitis due to pollen: Secondary | ICD-10-CM | POA: Diagnosis not present

## 2018-04-25 DIAGNOSIS — J3089 Other allergic rhinitis: Secondary | ICD-10-CM | POA: Diagnosis not present

## 2018-04-27 DIAGNOSIS — I2510 Atherosclerotic heart disease of native coronary artery without angina pectoris: Secondary | ICD-10-CM | POA: Diagnosis not present

## 2018-04-28 NOTE — Telephone Encounter (Signed)
Called and spoke with patient. Patient states she is currently seeing a Cardiologist, and their office also called her with the echo results as well. Patient will continue to follow-up with them. I also reminded patient that the most important thing is to keep BP controlled. Patient verbalized understanding. No further questions or concerns at this time.

## 2018-05-02 DIAGNOSIS — J3081 Allergic rhinitis due to animal (cat) (dog) hair and dander: Secondary | ICD-10-CM | POA: Diagnosis not present

## 2018-05-02 DIAGNOSIS — J301 Allergic rhinitis due to pollen: Secondary | ICD-10-CM | POA: Diagnosis not present

## 2018-05-02 DIAGNOSIS — J3089 Other allergic rhinitis: Secondary | ICD-10-CM | POA: Diagnosis not present

## 2018-05-09 DIAGNOSIS — J3081 Allergic rhinitis due to animal (cat) (dog) hair and dander: Secondary | ICD-10-CM | POA: Diagnosis not present

## 2018-05-09 DIAGNOSIS — J3089 Other allergic rhinitis: Secondary | ICD-10-CM | POA: Diagnosis not present

## 2018-05-09 DIAGNOSIS — J301 Allergic rhinitis due to pollen: Secondary | ICD-10-CM | POA: Diagnosis not present

## 2018-05-23 DIAGNOSIS — J3089 Other allergic rhinitis: Secondary | ICD-10-CM | POA: Diagnosis not present

## 2018-05-23 DIAGNOSIS — J301 Allergic rhinitis due to pollen: Secondary | ICD-10-CM | POA: Diagnosis not present

## 2018-05-23 DIAGNOSIS — J3081 Allergic rhinitis due to animal (cat) (dog) hair and dander: Secondary | ICD-10-CM | POA: Diagnosis not present

## 2018-06-06 DIAGNOSIS — J3081 Allergic rhinitis due to animal (cat) (dog) hair and dander: Secondary | ICD-10-CM | POA: Diagnosis not present

## 2018-06-06 DIAGNOSIS — J3089 Other allergic rhinitis: Secondary | ICD-10-CM | POA: Diagnosis not present

## 2018-06-06 DIAGNOSIS — J301 Allergic rhinitis due to pollen: Secondary | ICD-10-CM | POA: Diagnosis not present

## 2018-06-23 DIAGNOSIS — J301 Allergic rhinitis due to pollen: Secondary | ICD-10-CM | POA: Diagnosis not present

## 2018-06-23 DIAGNOSIS — J3081 Allergic rhinitis due to animal (cat) (dog) hair and dander: Secondary | ICD-10-CM | POA: Diagnosis not present

## 2018-06-23 DIAGNOSIS — J3089 Other allergic rhinitis: Secondary | ICD-10-CM | POA: Diagnosis not present

## 2018-07-05 DIAGNOSIS — J3081 Allergic rhinitis due to animal (cat) (dog) hair and dander: Secondary | ICD-10-CM | POA: Diagnosis not present

## 2018-07-05 DIAGNOSIS — J301 Allergic rhinitis due to pollen: Secondary | ICD-10-CM | POA: Diagnosis not present

## 2018-07-05 DIAGNOSIS — J3089 Other allergic rhinitis: Secondary | ICD-10-CM | POA: Diagnosis not present

## 2018-07-07 DIAGNOSIS — J3081 Allergic rhinitis due to animal (cat) (dog) hair and dander: Secondary | ICD-10-CM | POA: Diagnosis not present

## 2018-07-07 DIAGNOSIS — J3089 Other allergic rhinitis: Secondary | ICD-10-CM | POA: Diagnosis not present

## 2018-07-07 DIAGNOSIS — J301 Allergic rhinitis due to pollen: Secondary | ICD-10-CM | POA: Diagnosis not present

## 2018-07-07 IMAGING — US US MFM FETAL NUCHAL TRANSLUCENCY
1 series · 14 of 28 positions shown · non-contrast
Comparison: none

[Series 1: us mfm fetal nuchal translucency · 14 of 72 slices shown]
[im 3/72]
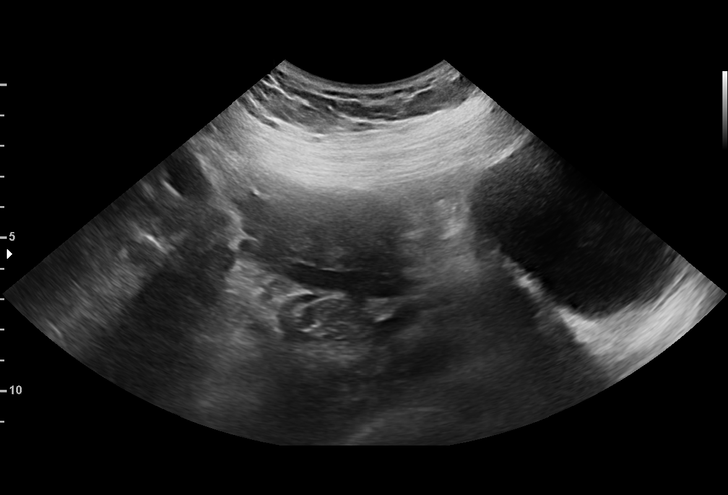
[im 8/72]
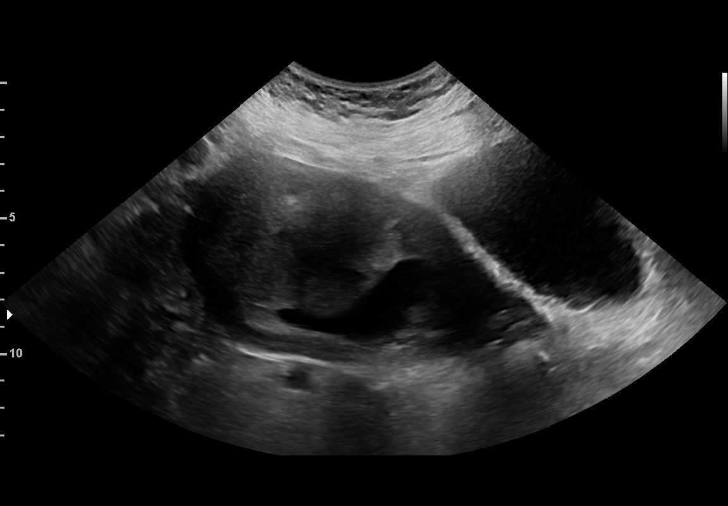
[im 14/72]
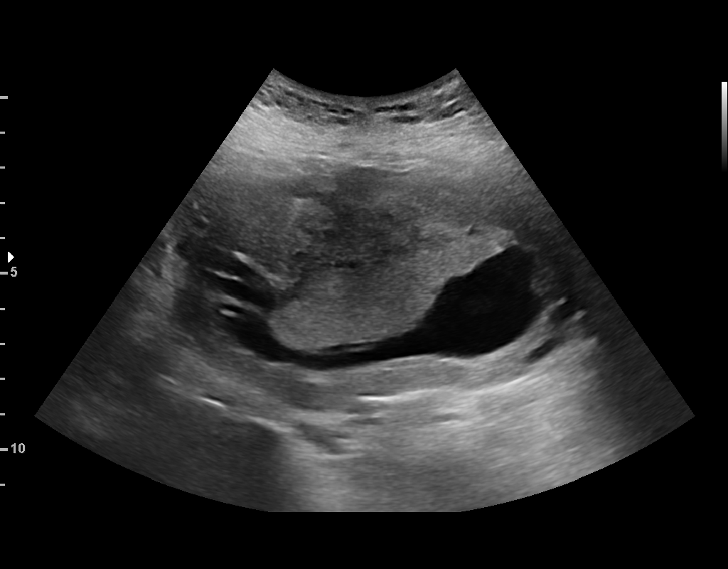
[im 19/72]
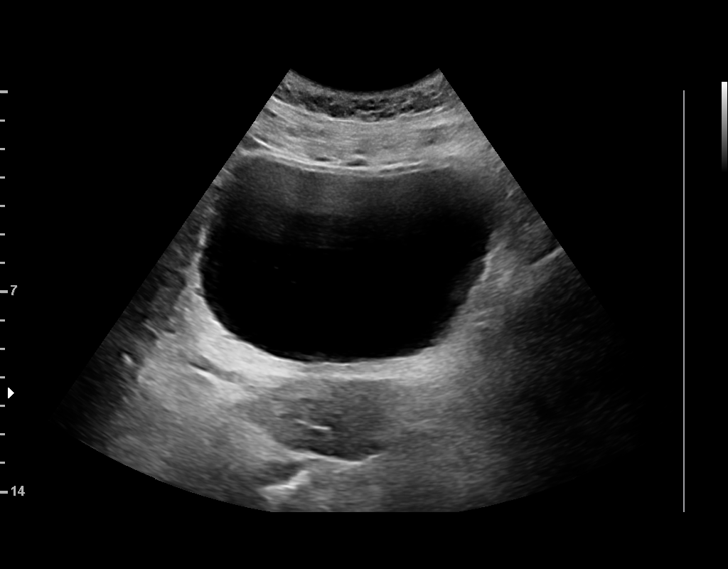
[im 24/72]
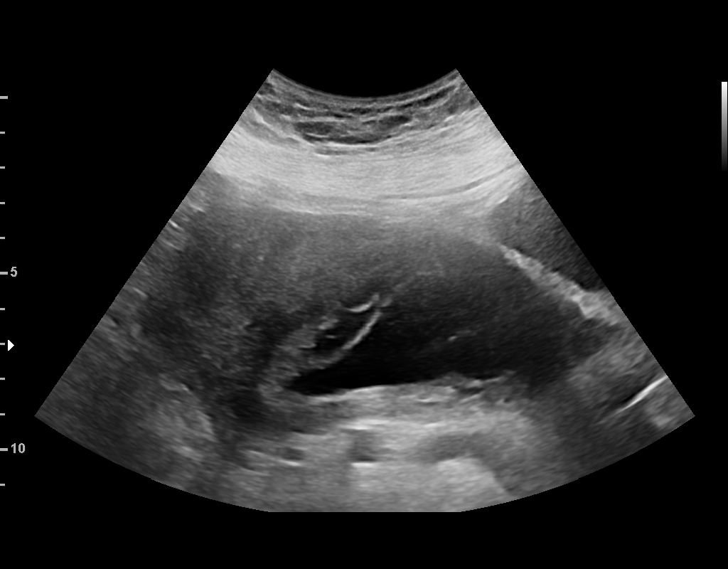
[im 29/72]
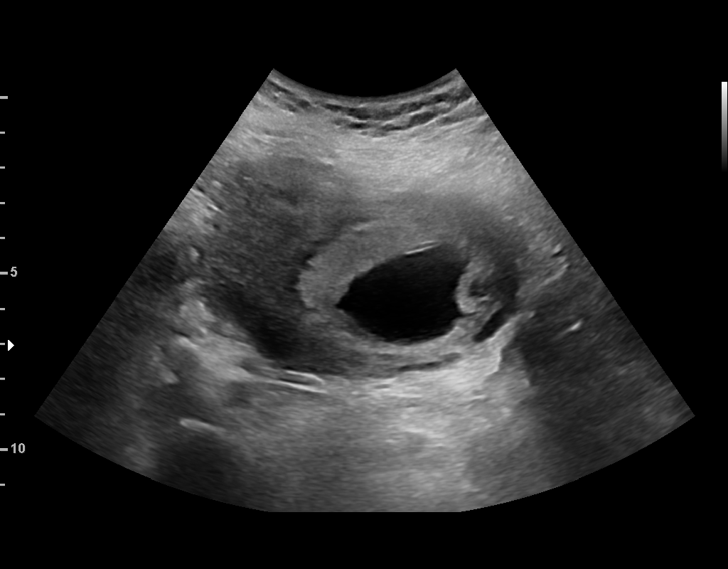
[im 35/72]
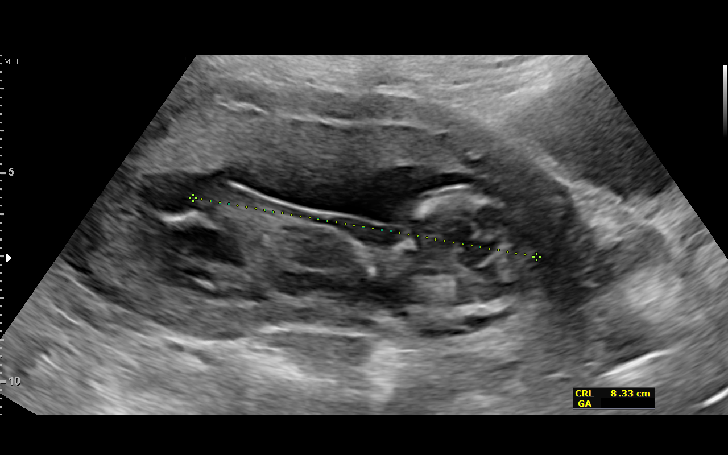
[im 40/72]
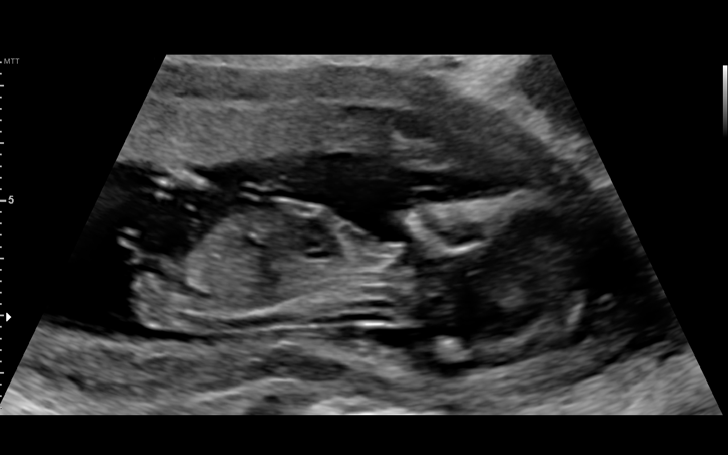
[im 45/72]
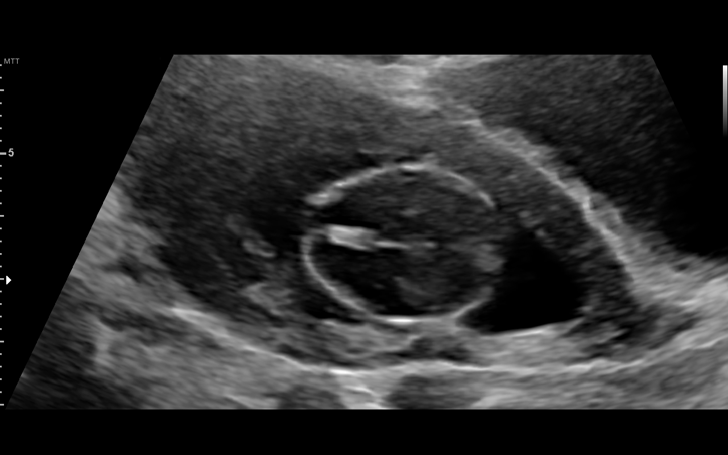
[im 50/72]
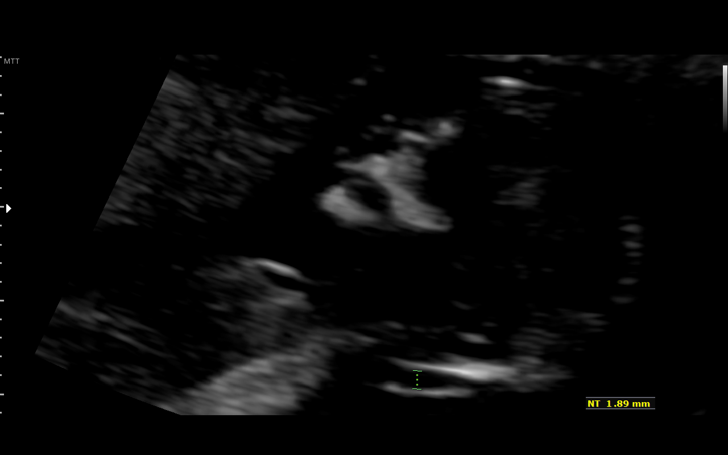
[im 56/72]
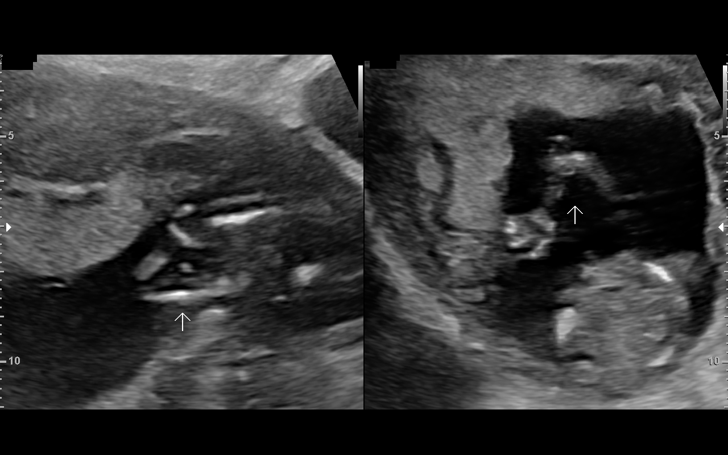
[im 61/72]
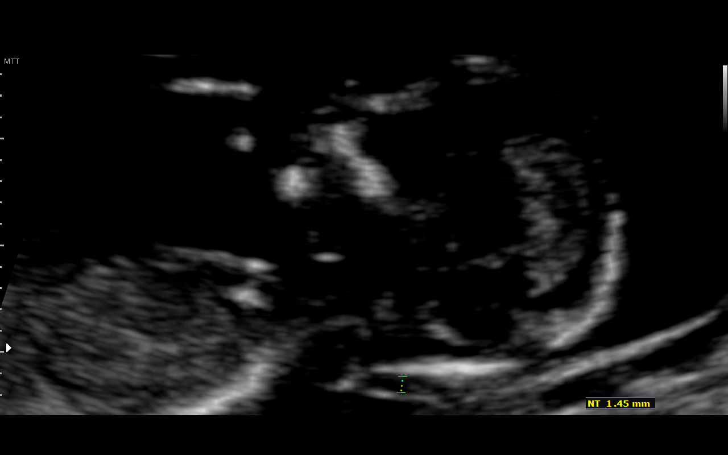
[im 66/72]
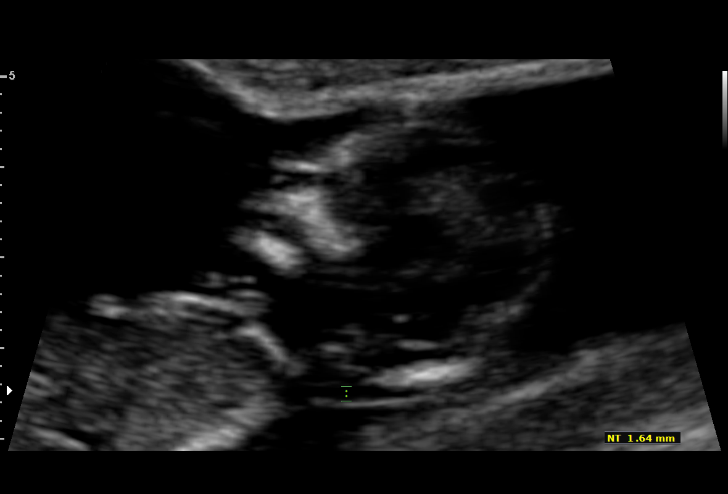
[im 72/72]
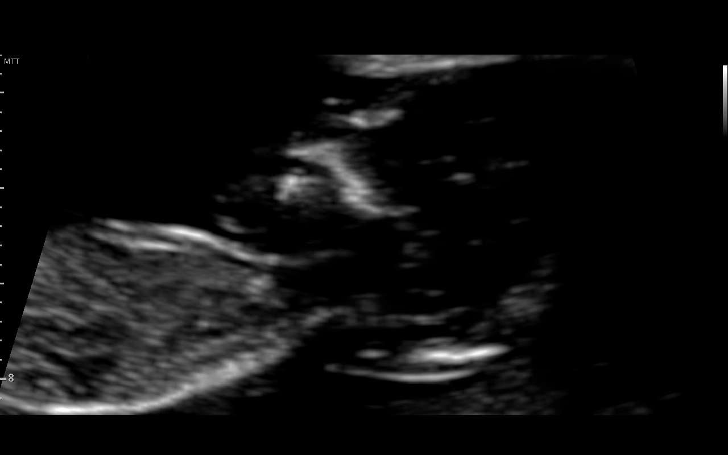

[14 of 28 positions shown; findings below may reference images not displayed]

for [REDACTED]care
Ref. Address:     [id number] [[address]OSPITAL]

TRANSLUCENCY

1  TADANORI JAYARA            [id number]      [id number]     [id number]
Indications

13 weeks gestation of pregnancy
Encounter for nuchal translucency
Obesity complicating pregnancy, first
trimester
Poor obstetric history: Previous
preeclampsia / eclampsia/gestational HTN
Medical complication of pregnancy (Hx
Arterial dissection, Hx MI)
History of cesarean delivery, currently
pregnant
OB History

Blood Type:            Height:  5'3"   Weight (lb):  217      BMI:
Gravidity:    2         Term:   1        Prem:   0        SAB:   0
TOP:          0       Ectopic:  0        Living: 1
Fetal Evaluation

Num Of Fetuses:     1
Fetal Heart         161
Rate(bpm):
Cardiac Activity:   Observed
Presentation:       Variable
Placenta:           Anterior
Amniotic Fluid
AFI FV:      Subjectively within normal limits

Comment:    Possible Small subchorionic hemorrhage noted at Right lateral
placenta.
Gestational Age

LMP:           14w 6d       Date:   04/14/16                 EDD:   01/19/17
Best:          13w 4d    Det. By:   Early Ultrasound         EDD:   01/28/17
(06/10/16)
1st Trimester Genetic Sonogram Screening

CRL:            80.6  mm    G. Age:   13w 5d                 EDD:   01/27/17
Nuc Trans:       1.9  mm
Nasal Bone:                 Present
Anatomy

Cranium:               Visualized             Bladder:                Visualized
Choroid Plexus:        Visualized             Upper Extremities:      Visualized
Heart:                 Visualized             Lower Extremities:      Visualized
Stomach:               Visualized

Other:  Technically difficult due to maternal habitus and early GA.
Cervix Uterus Adnexa

Cervix
Normal appearance by transabdominal scan.

Uterus
No abnormality visualized.

Left Ovary
Size(cm)     3.33  x    2.54   x  1.72      Vol(ml):
Within normal limits. No adnexal mass visualized.

Right Ovary
Size(cm)     2.75  x    2.3    x  1.98      Vol(ml):
Within normal limits. No adnexal mass visualized.

Cul De Sac:   No free fluid seen.

Adnexa:       No abnormality visualized.
Impression

IUP at 13+4 weeks, here for first trimester screening. Patient
had coronary dissection and stent placement in last
pregnancy
Normal fetal movement and cardiac activity
Normal placentation and amniotic fluid
CRL confirms early US EDC
Nuchal translucency measures 1.9mm
Recommendations

Serum analytes will be drawn today. she is seeing a local
cardiologist next week
I have scheduled an anatomic survey for 6 weeks
Recommend full MFM consult due to cardiac history

## 2018-07-21 DIAGNOSIS — J3081 Allergic rhinitis due to animal (cat) (dog) hair and dander: Secondary | ICD-10-CM | POA: Diagnosis not present

## 2018-07-21 DIAGNOSIS — J3089 Other allergic rhinitis: Secondary | ICD-10-CM | POA: Diagnosis not present

## 2018-07-21 DIAGNOSIS — J301 Allergic rhinitis due to pollen: Secondary | ICD-10-CM | POA: Diagnosis not present

## 2018-07-25 DIAGNOSIS — J3081 Allergic rhinitis due to animal (cat) (dog) hair and dander: Secondary | ICD-10-CM | POA: Diagnosis not present

## 2018-07-25 DIAGNOSIS — J301 Allergic rhinitis due to pollen: Secondary | ICD-10-CM | POA: Diagnosis not present

## 2018-07-25 DIAGNOSIS — J3089 Other allergic rhinitis: Secondary | ICD-10-CM | POA: Diagnosis not present

## 2018-07-25 DIAGNOSIS — L209 Atopic dermatitis, unspecified: Secondary | ICD-10-CM | POA: Diagnosis not present

## 2018-08-04 DIAGNOSIS — J3089 Other allergic rhinitis: Secondary | ICD-10-CM | POA: Diagnosis not present

## 2018-08-04 DIAGNOSIS — J3081 Allergic rhinitis due to animal (cat) (dog) hair and dander: Secondary | ICD-10-CM | POA: Diagnosis not present

## 2018-08-04 DIAGNOSIS — J301 Allergic rhinitis due to pollen: Secondary | ICD-10-CM | POA: Diagnosis not present

## 2018-08-18 DIAGNOSIS — J3081 Allergic rhinitis due to animal (cat) (dog) hair and dander: Secondary | ICD-10-CM | POA: Diagnosis not present

## 2018-08-18 DIAGNOSIS — J301 Allergic rhinitis due to pollen: Secondary | ICD-10-CM | POA: Diagnosis not present

## 2018-08-18 DIAGNOSIS — J3089 Other allergic rhinitis: Secondary | ICD-10-CM | POA: Diagnosis not present

## 2018-09-01 DIAGNOSIS — J3089 Other allergic rhinitis: Secondary | ICD-10-CM | POA: Diagnosis not present

## 2018-09-01 DIAGNOSIS — J301 Allergic rhinitis due to pollen: Secondary | ICD-10-CM | POA: Diagnosis not present

## 2018-09-01 DIAGNOSIS — J3081 Allergic rhinitis due to animal (cat) (dog) hair and dander: Secondary | ICD-10-CM | POA: Diagnosis not present

## 2018-09-15 DIAGNOSIS — J301 Allergic rhinitis due to pollen: Secondary | ICD-10-CM | POA: Diagnosis not present

## 2018-09-15 DIAGNOSIS — J3081 Allergic rhinitis due to animal (cat) (dog) hair and dander: Secondary | ICD-10-CM | POA: Diagnosis not present

## 2018-09-15 DIAGNOSIS — J3089 Other allergic rhinitis: Secondary | ICD-10-CM | POA: Diagnosis not present

## 2018-09-29 DIAGNOSIS — J3089 Other allergic rhinitis: Secondary | ICD-10-CM | POA: Diagnosis not present

## 2018-09-29 DIAGNOSIS — J3081 Allergic rhinitis due to animal (cat) (dog) hair and dander: Secondary | ICD-10-CM | POA: Diagnosis not present

## 2018-09-29 DIAGNOSIS — J301 Allergic rhinitis due to pollen: Secondary | ICD-10-CM | POA: Diagnosis not present

## 2018-10-13 DIAGNOSIS — J301 Allergic rhinitis due to pollen: Secondary | ICD-10-CM | POA: Diagnosis not present

## 2018-10-13 DIAGNOSIS — J3089 Other allergic rhinitis: Secondary | ICD-10-CM | POA: Diagnosis not present

## 2018-10-13 DIAGNOSIS — J3081 Allergic rhinitis due to animal (cat) (dog) hair and dander: Secondary | ICD-10-CM | POA: Diagnosis not present

## 2018-10-27 DIAGNOSIS — J3089 Other allergic rhinitis: Secondary | ICD-10-CM | POA: Diagnosis not present

## 2018-10-27 DIAGNOSIS — J3081 Allergic rhinitis due to animal (cat) (dog) hair and dander: Secondary | ICD-10-CM | POA: Diagnosis not present

## 2018-10-27 DIAGNOSIS — J301 Allergic rhinitis due to pollen: Secondary | ICD-10-CM | POA: Diagnosis not present

## 2018-11-10 DIAGNOSIS — J3089 Other allergic rhinitis: Secondary | ICD-10-CM | POA: Diagnosis not present

## 2018-11-10 DIAGNOSIS — J3081 Allergic rhinitis due to animal (cat) (dog) hair and dander: Secondary | ICD-10-CM | POA: Diagnosis not present

## 2018-11-10 DIAGNOSIS — J301 Allergic rhinitis due to pollen: Secondary | ICD-10-CM | POA: Diagnosis not present

## 2018-11-24 DIAGNOSIS — J3081 Allergic rhinitis due to animal (cat) (dog) hair and dander: Secondary | ICD-10-CM | POA: Diagnosis not present

## 2018-11-24 DIAGNOSIS — J3089 Other allergic rhinitis: Secondary | ICD-10-CM | POA: Diagnosis not present

## 2018-11-24 DIAGNOSIS — J301 Allergic rhinitis due to pollen: Secondary | ICD-10-CM | POA: Diagnosis not present

## 2018-12-09 DIAGNOSIS — J3089 Other allergic rhinitis: Secondary | ICD-10-CM | POA: Diagnosis not present

## 2018-12-09 DIAGNOSIS — J301 Allergic rhinitis due to pollen: Secondary | ICD-10-CM | POA: Diagnosis not present

## 2018-12-09 DIAGNOSIS — J3081 Allergic rhinitis due to animal (cat) (dog) hair and dander: Secondary | ICD-10-CM | POA: Diagnosis not present

## 2018-12-22 DIAGNOSIS — J3081 Allergic rhinitis due to animal (cat) (dog) hair and dander: Secondary | ICD-10-CM | POA: Diagnosis not present

## 2018-12-22 DIAGNOSIS — J3089 Other allergic rhinitis: Secondary | ICD-10-CM | POA: Diagnosis not present

## 2018-12-22 DIAGNOSIS — J301 Allergic rhinitis due to pollen: Secondary | ICD-10-CM | POA: Diagnosis not present

## 2019-01-05 DIAGNOSIS — J3089 Other allergic rhinitis: Secondary | ICD-10-CM | POA: Diagnosis not present

## 2019-01-05 DIAGNOSIS — J3081 Allergic rhinitis due to animal (cat) (dog) hair and dander: Secondary | ICD-10-CM | POA: Diagnosis not present

## 2019-01-05 DIAGNOSIS — J301 Allergic rhinitis due to pollen: Secondary | ICD-10-CM | POA: Diagnosis not present

## 2019-01-19 DIAGNOSIS — J3089 Other allergic rhinitis: Secondary | ICD-10-CM | POA: Diagnosis not present

## 2019-01-19 DIAGNOSIS — J301 Allergic rhinitis due to pollen: Secondary | ICD-10-CM | POA: Diagnosis not present

## 2019-01-19 DIAGNOSIS — J3081 Allergic rhinitis due to animal (cat) (dog) hair and dander: Secondary | ICD-10-CM | POA: Diagnosis not present

## 2019-02-02 DIAGNOSIS — J301 Allergic rhinitis due to pollen: Secondary | ICD-10-CM | POA: Diagnosis not present

## 2019-02-02 DIAGNOSIS — J3089 Other allergic rhinitis: Secondary | ICD-10-CM | POA: Diagnosis not present

## 2019-02-02 DIAGNOSIS — J3081 Allergic rhinitis due to animal (cat) (dog) hair and dander: Secondary | ICD-10-CM | POA: Diagnosis not present

## 2019-02-16 DIAGNOSIS — J3089 Other allergic rhinitis: Secondary | ICD-10-CM | POA: Diagnosis not present

## 2019-02-16 DIAGNOSIS — J301 Allergic rhinitis due to pollen: Secondary | ICD-10-CM | POA: Diagnosis not present

## 2019-02-16 DIAGNOSIS — J3081 Allergic rhinitis due to animal (cat) (dog) hair and dander: Secondary | ICD-10-CM | POA: Diagnosis not present

## 2019-03-21 DIAGNOSIS — J3089 Other allergic rhinitis: Secondary | ICD-10-CM | POA: Diagnosis not present

## 2019-03-21 DIAGNOSIS — J301 Allergic rhinitis due to pollen: Secondary | ICD-10-CM | POA: Diagnosis not present

## 2019-03-21 DIAGNOSIS — J3081 Allergic rhinitis due to animal (cat) (dog) hair and dander: Secondary | ICD-10-CM | POA: Diagnosis not present

## 2019-04-04 DIAGNOSIS — J3081 Allergic rhinitis due to animal (cat) (dog) hair and dander: Secondary | ICD-10-CM | POA: Diagnosis not present

## 2019-04-04 DIAGNOSIS — J3089 Other allergic rhinitis: Secondary | ICD-10-CM | POA: Diagnosis not present

## 2019-04-04 DIAGNOSIS — J301 Allergic rhinitis due to pollen: Secondary | ICD-10-CM | POA: Diagnosis not present

## 2019-04-18 DIAGNOSIS — J3089 Other allergic rhinitis: Secondary | ICD-10-CM | POA: Diagnosis not present

## 2019-04-18 DIAGNOSIS — J3081 Allergic rhinitis due to animal (cat) (dog) hair and dander: Secondary | ICD-10-CM | POA: Diagnosis not present

## 2019-04-18 DIAGNOSIS — J301 Allergic rhinitis due to pollen: Secondary | ICD-10-CM | POA: Diagnosis not present

## 2019-05-23 DIAGNOSIS — J301 Allergic rhinitis due to pollen: Secondary | ICD-10-CM | POA: Diagnosis not present

## 2019-05-23 DIAGNOSIS — J3081 Allergic rhinitis due to animal (cat) (dog) hair and dander: Secondary | ICD-10-CM | POA: Diagnosis not present

## 2019-05-23 DIAGNOSIS — J3089 Other allergic rhinitis: Secondary | ICD-10-CM | POA: Diagnosis not present

## 2019-06-05 DIAGNOSIS — J301 Allergic rhinitis due to pollen: Secondary | ICD-10-CM | POA: Diagnosis not present

## 2019-06-05 DIAGNOSIS — J3089 Other allergic rhinitis: Secondary | ICD-10-CM | POA: Diagnosis not present

## 2019-06-05 DIAGNOSIS — J3081 Allergic rhinitis due to animal (cat) (dog) hair and dander: Secondary | ICD-10-CM | POA: Diagnosis not present

## 2019-06-19 DIAGNOSIS — J301 Allergic rhinitis due to pollen: Secondary | ICD-10-CM | POA: Diagnosis not present

## 2019-06-19 DIAGNOSIS — J3089 Other allergic rhinitis: Secondary | ICD-10-CM | POA: Diagnosis not present

## 2019-06-19 DIAGNOSIS — J3081 Allergic rhinitis due to animal (cat) (dog) hair and dander: Secondary | ICD-10-CM | POA: Diagnosis not present

## 2019-06-28 DIAGNOSIS — Z23 Encounter for immunization: Secondary | ICD-10-CM | POA: Diagnosis not present

## 2019-07-03 DIAGNOSIS — J301 Allergic rhinitis due to pollen: Secondary | ICD-10-CM | POA: Diagnosis not present

## 2019-07-03 DIAGNOSIS — J3089 Other allergic rhinitis: Secondary | ICD-10-CM | POA: Diagnosis not present

## 2019-07-03 DIAGNOSIS — J3081 Allergic rhinitis due to animal (cat) (dog) hair and dander: Secondary | ICD-10-CM | POA: Diagnosis not present

## 2019-07-06 DIAGNOSIS — J3089 Other allergic rhinitis: Secondary | ICD-10-CM | POA: Diagnosis not present

## 2019-07-06 DIAGNOSIS — J3081 Allergic rhinitis due to animal (cat) (dog) hair and dander: Secondary | ICD-10-CM | POA: Diagnosis not present

## 2019-07-06 DIAGNOSIS — J301 Allergic rhinitis due to pollen: Secondary | ICD-10-CM | POA: Diagnosis not present

## 2019-07-17 DIAGNOSIS — J3089 Other allergic rhinitis: Secondary | ICD-10-CM | POA: Diagnosis not present

## 2019-07-17 DIAGNOSIS — J301 Allergic rhinitis due to pollen: Secondary | ICD-10-CM | POA: Diagnosis not present

## 2019-07-17 DIAGNOSIS — J3081 Allergic rhinitis due to animal (cat) (dog) hair and dander: Secondary | ICD-10-CM | POA: Diagnosis not present

## 2019-07-19 DIAGNOSIS — Z23 Encounter for immunization: Secondary | ICD-10-CM | POA: Diagnosis not present

## 2019-07-31 DIAGNOSIS — J3081 Allergic rhinitis due to animal (cat) (dog) hair and dander: Secondary | ICD-10-CM | POA: Diagnosis not present

## 2019-07-31 DIAGNOSIS — J301 Allergic rhinitis due to pollen: Secondary | ICD-10-CM | POA: Diagnosis not present

## 2019-07-31 DIAGNOSIS — J3089 Other allergic rhinitis: Secondary | ICD-10-CM | POA: Diagnosis not present

## 2019-08-01 DIAGNOSIS — L509 Urticaria, unspecified: Secondary | ICD-10-CM | POA: Diagnosis not present

## 2019-08-01 DIAGNOSIS — L209 Atopic dermatitis, unspecified: Secondary | ICD-10-CM | POA: Diagnosis not present

## 2019-08-01 DIAGNOSIS — J301 Allergic rhinitis due to pollen: Secondary | ICD-10-CM | POA: Diagnosis not present

## 2019-08-01 DIAGNOSIS — J3089 Other allergic rhinitis: Secondary | ICD-10-CM | POA: Diagnosis not present

## 2019-08-01 DIAGNOSIS — J3081 Allergic rhinitis due to animal (cat) (dog) hair and dander: Secondary | ICD-10-CM | POA: Diagnosis not present

## 2019-08-21 DIAGNOSIS — J3089 Other allergic rhinitis: Secondary | ICD-10-CM | POA: Diagnosis not present

## 2019-08-21 DIAGNOSIS — J3081 Allergic rhinitis due to animal (cat) (dog) hair and dander: Secondary | ICD-10-CM | POA: Diagnosis not present

## 2019-08-21 DIAGNOSIS — J301 Allergic rhinitis due to pollen: Secondary | ICD-10-CM | POA: Diagnosis not present

## 2019-09-04 DIAGNOSIS — J3089 Other allergic rhinitis: Secondary | ICD-10-CM | POA: Diagnosis not present

## 2019-09-04 DIAGNOSIS — J3081 Allergic rhinitis due to animal (cat) (dog) hair and dander: Secondary | ICD-10-CM | POA: Diagnosis not present

## 2019-09-04 DIAGNOSIS — J301 Allergic rhinitis due to pollen: Secondary | ICD-10-CM | POA: Diagnosis not present

## 2019-09-25 DIAGNOSIS — J3089 Other allergic rhinitis: Secondary | ICD-10-CM | POA: Diagnosis not present

## 2019-09-25 DIAGNOSIS — J3081 Allergic rhinitis due to animal (cat) (dog) hair and dander: Secondary | ICD-10-CM | POA: Diagnosis not present

## 2019-09-25 DIAGNOSIS — J301 Allergic rhinitis due to pollen: Secondary | ICD-10-CM | POA: Diagnosis not present

## 2019-10-09 DIAGNOSIS — J3089 Other allergic rhinitis: Secondary | ICD-10-CM | POA: Diagnosis not present

## 2019-10-09 DIAGNOSIS — J3081 Allergic rhinitis due to animal (cat) (dog) hair and dander: Secondary | ICD-10-CM | POA: Diagnosis not present

## 2019-10-09 DIAGNOSIS — J301 Allergic rhinitis due to pollen: Secondary | ICD-10-CM | POA: Diagnosis not present

## 2019-10-23 DIAGNOSIS — J3089 Other allergic rhinitis: Secondary | ICD-10-CM | POA: Diagnosis not present

## 2019-10-23 DIAGNOSIS — J301 Allergic rhinitis due to pollen: Secondary | ICD-10-CM | POA: Diagnosis not present

## 2019-10-23 DIAGNOSIS — J3081 Allergic rhinitis due to animal (cat) (dog) hair and dander: Secondary | ICD-10-CM | POA: Diagnosis not present

## 2019-11-06 DIAGNOSIS — J3081 Allergic rhinitis due to animal (cat) (dog) hair and dander: Secondary | ICD-10-CM | POA: Diagnosis not present

## 2019-11-06 DIAGNOSIS — J3089 Other allergic rhinitis: Secondary | ICD-10-CM | POA: Diagnosis not present

## 2019-11-06 DIAGNOSIS — J301 Allergic rhinitis due to pollen: Secondary | ICD-10-CM | POA: Diagnosis not present

## 2019-11-13 ENCOUNTER — Telehealth (INDEPENDENT_AMBULATORY_CARE_PROVIDER_SITE_OTHER): Payer: Medicaid Other | Admitting: Physician Assistant

## 2019-11-13 ENCOUNTER — Encounter: Payer: Self-pay | Admitting: Physician Assistant

## 2019-11-13 VITALS — BP 144/95 | HR 91 | Temp 97.6°F | Ht 63.0 in | Wt 240.0 lb

## 2019-11-13 DIAGNOSIS — Z131 Encounter for screening for diabetes mellitus: Secondary | ICD-10-CM

## 2019-11-13 DIAGNOSIS — I10 Essential (primary) hypertension: Secondary | ICD-10-CM | POA: Diagnosis not present

## 2019-11-13 DIAGNOSIS — I2542 Coronary artery dissection: Secondary | ICD-10-CM

## 2019-11-13 DIAGNOSIS — Z13220 Encounter for screening for lipoid disorders: Secondary | ICD-10-CM | POA: Diagnosis not present

## 2019-11-13 DIAGNOSIS — Z0000 Encounter for general adult medical examination without abnormal findings: Secondary | ICD-10-CM | POA: Diagnosis not present

## 2019-11-13 DIAGNOSIS — R030 Elevated blood-pressure reading, without diagnosis of hypertension: Secondary | ICD-10-CM

## 2019-11-13 DIAGNOSIS — Z6838 Body mass index (BMI) 38.0-38.9, adult: Secondary | ICD-10-CM | POA: Insufficient documentation

## 2019-11-13 NOTE — Progress Notes (Signed)
Patient ID: Carol Wright, female   DOB: [birth year], 34 y.o.   MRN: [id number] .Marland KitchenVirtual Visit via Video Note  I connected with Jenniffer Vessels on 11/13/19 at  9:50 AM EDT by a video enabled telemedicine application and verified that I am speaking with the correct person using two identifiers.  Location: Patient: home Provider: clinic   I discussed the limitations of evaluation and management by telemedicine and the availability of in person appointments. The patient expressed understanding and agreed to proceed.  History of Present Illness: Pt is a 34 yo obese female with hx of dissection of coronary artery, HTN, PCOS, MDD, GAD who presents to the clinic for routine physical.   She is not taking any medications daily. She has struggled with weight gain since last baby in 2018. She denies any significant mood changes. She has noticed her BP elevating. At home since the weekend noticed 140's over 150's and 80's-90s. She has had HA, dizziness, palpitations since the weekend. Her palpitations have actually started for last month. No URI symptoms. No cough or SOB or CP. No swelling.  Not on any medications. No diet changes.   .. Active Ambulatory Problems    Diagnosis Date Noted  . Depression 06/13/2013  . Generalized anxiety disorder 06/13/2013  . Hx of myocardial infarction 06/20/2013  . Hepatic steatosis 06/20/2013  . Maternal coronary artery disease affecting pregnancy, antepartum 07/12/2013  . Elevated blood pressure reading 06/22/2014  . PCO (polycystic ovaries) 01/16/2016  . Hypertension 05/06/2016  . Infertility management 05/06/2016  . Supervision of high risk pregnancy, antepartum 06/10/2016  . Low vitamin D level 06/22/2016  . 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 Chronic hypertension during pregnancy, antepartum 07/06/2016  . H/O: cesarean section 10/11/2016  . Ventricular aneurysm 04/22/2018  . Morbid obesity ([place]) 11/13/2019   Resolved Ambulatory  Problems    Diagnosis Date Noted  . Obesity (BMI 30-39.9) 06/20/2013  . Sore throat 07/17/2014   Past Medical History:  Diagnosis Date  . H/O arterial dissection   . Hyperlipidemia   . Myocardial infarction ([place]) 2013  . PCOS (polycystic ovarian syndrome)   . Pre-eclampsia    Reviewed med, allergy, or problem list.     Observations/Objective: No acute distress Normal breathing.  Normal mood and appearance.   .. Today's Vitals   11/13/19 0856  BP: (!) 144/95  Pulse: 91  Temp: 97.6 F (36.4 C)  TempSrc: Oral  Weight: 240 lb (108.9 kg)  Height: 5\' 3"  (1.6 m)   Body mass index is 42.51 kg/m.  .. Depression screen [facility] 2/9 11/13/2019  Decreased Interest 0  Down, Depressed, Hopeless 0  PHQ - 2 Score 0  Altered sleeping 0  Tired, decreased energy 1  Change in appetite 3  Feeling bad or failure about yourself  3  Trouble concentrating 0  Moving slowly or fidgety/restless 0  Suicidal thoughts 0  PHQ-9 Score 7  Difficult doing work/chores Not difficult at all   .Marland Kitchen GAD 7 : Generalized Anxiety Score 11/13/2019  Nervous, Anxious, on Edge 0  Control/stop worrying 1  Worry too much - different things 1  Trouble relaxing 1  Restless 0  Easily annoyed or irritable 1  Afraid - awful might happen 0  Total GAD 7 Score 4  Anxiety Difficulty Not difficult at all       Assessment and Plan:  Marland KitchenMarland KitchenDiagnoses and all orders for this visit:  Routine physical examination -     Lipid Panel w/reflex Direct LDL -  COMPLETE METABOLIC PANEL WITH GFR -     TSH -     CBC  Screening for diabetes mellitus -     COMPLETE METABOLIC PANEL WITH GFR  Screening for lipid disorders -     Lipid Panel w/reflex Direct LDL  Elevated blood pressure reading -     COMPLETE METABOLIC PANEL WITH GFR -     TSH -     CBC  Morbid obesity (HCC)  Spontaneous dissection of coronary artery  Essential hypertension   .Marland Kitchen Discussed 150 minutes of exercise a week.  Encouraged vitamin D  1000 units and Calcium 1300mg  or 4 servings of dairy a day.  Fasting labs ordered today.  Need for pap.  Vaccine up to date.  Needs flu shot.   A few days of bP elevated. If worsens or CP occurs need to go to UC. appt with cardiology in [place] 20. If above 160/95 call office. Will get labs first if no cause of BP elevation will consider low dose medication to start until cardiology appt. If remains symptoms continue to keep follow up. No sudden illness symptoms or stress. 2-4 week BP check.   Marland Kitchen.Discussed low carb diet with 1500 calories and 80g of protein.  Exercising at least 150 minutes a week.  [employer] could be a Microbiologist.  Discussed IF.  Discussed metformin to help with PcOS and weight loss.  Consider wegovy in future.  Follow up discuss weight in future.    Follow Up Instructions:    I discussed the assessment and treatment plan with the patient. The patient was provided an opportunity to ask questions and all were answered. The patient agreed with the plan and demonstrated an understanding of the instructions.   The patient was advised to call back or seek an in-person evaluation if the symptoms worsen or if the condition fails to improve as anticipated.  I provided 15 minutes of non-face-to-face time during this encounter.   Iran Planas, PA-C

## 2019-11-13 NOTE — Progress Notes (Signed)
Blood pressure has been elevated Cardiology appt September 20th Occasional dizziness due to blood pressure

## 2019-11-14 ENCOUNTER — Encounter: Payer: Self-pay | Admitting: Physician Assistant

## 2019-11-14 LAB — COMPLETE METABOLIC PANEL WITH GFR
AG Ratio: 1.4 (calc) (ref 1.0–2.5)
ALT: 50 U/L — ABNORMAL HIGH (ref 6–29)
AST: 35 U/L — ABNORMAL HIGH (ref 10–30)
Albumin: 4.2 g/dL (ref 3.6–5.1)
Alkaline phosphatase (APISO): 74 U/L (ref 31–125)
BUN: 11 mg/dL (ref 7–25)
CO2: 28 mmol/L (ref 20–32)
Calcium: 8.8 mg/dL (ref 8.6–10.2)
Chloride: 104 mmol/L (ref 98–110)
Creat: 0.61 mg/dL (ref 0.50–1.10)
GFR, Est African American: 135 mL/min/{1.73_m2} (ref >=60)
GFR, Est Non African American: 117 mL/min/{1.73_m2} (ref >=60)
Globulin: 2.9 g/dL (calc) (ref 1.9–3.7)
Glucose, Bld: 88 mg/dL (ref 65–99)
Potassium: 3.8 mmol/L (ref 3.5–5.3)
Sodium: 138 mmol/L (ref 135–146)
Total Bilirubin: 1.8 mg/dL — ABNORMAL HIGH (ref 0.2–1.2)
Total Protein: 7.1 g/dL (ref 6.1–8.1)

## 2019-11-14 LAB — CBC
HCT: 37.7 % (ref 35.0–45.0)
Hemoglobin: 12.9 g/dL (ref 11.7–15.5)
MCH: 28.9 pg (ref 27.0–33.0)
MCHC: 34.2 g/dL (ref 32.0–36.0)
MCV: 84.5 fL (ref 80.0–100.0)
MPV: 11.4 fL (ref 7.5–12.5)
Platelets: 191 10*3/uL (ref 140–400)
RBC: 4.46 10*6/uL (ref 3.80–5.10)
RDW: 13.6 % (ref 11.0–15.0)
WBC: 7.4 10*3/uL (ref 3.8–10.8)

## 2019-11-14 LAB — LIPID PANEL W/REFLEX DIRECT LDL
Cholesterol: 149 mg/dL (ref <200)
HDL: 33 mg/dL — ABNORMAL LOW (ref >=50)
LDL Cholesterol (Calc): 94 mg/dL (calc)
Non-HDL Cholesterol (Calc): 116 mg/dL (calc) (ref <130)
Total CHOL/HDL Ratio: 4.5 (calc) (ref <5.0)
Triglycerides: 121 mg/dL (ref <150)

## 2019-11-14 LAB — TSH: TSH: 1.35 mIU/L

## 2019-11-14 MED ORDER — METFORMIN HCL 500 MG PO TABS: 500.0000 mg | ORAL_TABLET | Freq: Two times a day (BID) | ORAL | 1 refills | Status: DC

## 2019-11-14 NOTE — Progress Notes (Signed)
Sent take with meals. When you first start diarrhea and stomach upset can be present but usually does get better.

## 2019-11-14 NOTE — Progress Notes (Signed)
Carol Wright,   Cholesterol looks good despite a little low HDL(good cholesterol).  No anemia.  Thyroid looks great.  Your liver enzymes are stable and somewhat improved.   No cause of elevated blood pressure or palpitations.   Would you want to consider wegovy or metformin to help with weight loss?

## 2019-11-14 NOTE — Addendum Note (Signed)
Addended by: Donella Stade on: 11/14/2019 01:02 PM   Modules accepted: Orders

## 2019-11-27 ENCOUNTER — Ambulatory Visit: Payer: Medicaid Other | Admitting: Obstetrics & Gynecology

## 2019-11-27 ENCOUNTER — Other Ambulatory Visit (HOSPITAL_COMMUNITY)
Admission: RE | Admit: 2019-11-27 | Discharge: 2019-11-27 | Disposition: A | Discharge status: Home / Self Care | Payer: Medicaid Other | Source: Ambulatory Visit | Attending: Obstetrics & Gynecology | Admitting: Obstetrics & Gynecology

## 2019-11-27 ENCOUNTER — Other Ambulatory Visit: Payer: Self-pay

## 2019-11-27 ENCOUNTER — Encounter: Payer: Self-pay | Admitting: Obstetrics & Gynecology

## 2019-11-27 VITALS — BP 136/86 | HR 72 | Ht 63.0 in | Wt 226.0 lb

## 2019-11-27 DIAGNOSIS — Z01419 Encounter for gynecological examination (general) (routine) without abnormal findings: Secondary | ICD-10-CM | POA: Diagnosis not present

## 2019-11-27 DIAGNOSIS — Z23 Encounter for immunization: Secondary | ICD-10-CM

## 2019-11-27 DIAGNOSIS — J301 Allergic rhinitis due to pollen: Secondary | ICD-10-CM | POA: Diagnosis not present

## 2019-11-27 DIAGNOSIS — E669 Obesity, unspecified: Secondary | ICD-10-CM

## 2019-11-27 DIAGNOSIS — J3081 Allergic rhinitis due to animal (cat) (dog) hair and dander: Secondary | ICD-10-CM | POA: Diagnosis not present

## 2019-11-27 DIAGNOSIS — N912 Amenorrhea, unspecified: Secondary | ICD-10-CM | POA: Diagnosis not present

## 2019-11-27 DIAGNOSIS — J3089 Other allergic rhinitis: Secondary | ICD-10-CM | POA: Diagnosis not present

## 2019-11-27 MED ORDER — LETROZOLE 2.5 MG PO TABS: ORAL_TABLET | ORAL | 0 refills | Status: DC

## 2019-11-27 MED ORDER — MEDROXYPROGESTERONE ACETATE 10 MG PO TABS: 10.0000 mg | ORAL_TABLET | Freq: Every day | ORAL | 2 refills | Status: DC

## 2019-11-27 NOTE — Progress Notes (Signed)
Subjective:     Carol Wright is a 34 y.o. female here for a routine exam.  Current complaints: recent weight gain (up to [place] pregnancy.  BP elevated and better with 12 pound weight loss.  Seeing cardiologist later this month--may need to start BP meds (compatible with pregnancy).  PNV daily      Gynecologic History Patient's last menstrual period was 09/27/2019. Contraception: none Last Pap: 2017. Results were: normal Last mammogram: n/a.   Obstetric History OB History  Gravida Para Term Preterm AB Living  2 2 0 2   2  SAB TAB Ectopic Multiple Live Births               # Outcome Date GA Lbr Len/2nd Weight Sex Delivery Anes PTL Lv  2 Preterm      CS-LTranv     1 Preterm      CS-LTranv        The following portions of the patient's history were reviewed and updated as appropriate: allergies, current medications, past family history, past medical history, past social history, past surgical history and problem list.  Review of Systems Pertinent items noted in HPI and remainder of comprehensive ROS otherwise negative.    Objective:      Vitals:   11/27/19 0841  BP: 136/86  Pulse: 72  Weight: 226 lb (102.5 kg)  Height: 5\' 3"  (1.6 m)   Vitals:  WNL General appearance: alert, cooperative and no distress  HEENT: Normocephalic, without obvious abnormality, atraumatic Eyes: negative Throat: lips, mucosa, and tongue normal; teeth and gums normal  Respiratory: Clear to auscultation bilaterally  CV: Regular rate and rhythm  Breasts:  Normal appearance, no masses or tenderness, no nipple retraction or dimpling  GI: Soft, non-tender; bowel sounds normal; no masses,  no organomegaly  GU: External Genitalia:  Tanner V, no lesion Urethra:  No prolapse   Vagina: Pink, normal rugae, no blood or discharge  Cervix: No CMT, no lesion  Uterus:  Normal size and contour, non tender  Adnexa: Normal, no masses, non tender  Musculoskeletal: No edema, redness or tenderness in the calves  or thighs  Skin: Acanthosis nigricans at neck.  Lymphatic: Axillary adenopathy: none     Psychiatric: Normal mood and behavior        Assessment:    Healthy female exam.   HTN by new AHA guidelines PCO with oligomenorrhea   Plan:   1.  Pap with cotesting 2.  Flu shot 3.  Hgb A1c 4.  Follow up with cards regarding BP 5.  Beta Hcg today, provera w/d bleed with FemarA AT DAY 3-7 6.  pnv DAILY 7.  Consider genetic testing for family history of breast cancer and ovarian cancer (great grandmother).

## 2019-11-28 LAB — HEMOGLOBIN A1C
Hgb A1c MFr Bld: 4.5 % of total Hgb (ref <5.7)
Mean Plasma Glucose: 82 (calc)
eAG (mmol/L): 4.6 (calc)

## 2019-11-29 LAB — CYTOLOGY - PAP
Comment: NEGATIVE
Diagnosis: NEGATIVE
High risk HPV: NEGATIVE

## 2019-11-29 LAB — BETA HCG QUANT (REF LAB): hCG Quant: <1 m[IU]/mL

## 2019-12-04 DIAGNOSIS — I2510 Atherosclerotic heart disease of native coronary artery without angina pectoris: Secondary | ICD-10-CM | POA: Diagnosis not present

## 2019-12-04 DIAGNOSIS — I10 Essential (primary) hypertension: Secondary | ICD-10-CM | POA: Diagnosis not present

## 2019-12-11 DIAGNOSIS — J3081 Allergic rhinitis due to animal (cat) (dog) hair and dander: Secondary | ICD-10-CM | POA: Diagnosis not present

## 2019-12-11 DIAGNOSIS — J301 Allergic rhinitis due to pollen: Secondary | ICD-10-CM | POA: Diagnosis not present

## 2019-12-11 DIAGNOSIS — J3089 Other allergic rhinitis: Secondary | ICD-10-CM | POA: Diagnosis not present

## 2019-12-25 DIAGNOSIS — J3081 Allergic rhinitis due to animal (cat) (dog) hair and dander: Secondary | ICD-10-CM | POA: Diagnosis not present

## 2019-12-25 DIAGNOSIS — J3089 Other allergic rhinitis: Secondary | ICD-10-CM | POA: Diagnosis not present

## 2019-12-25 DIAGNOSIS — J301 Allergic rhinitis due to pollen: Secondary | ICD-10-CM | POA: Diagnosis not present

## 2020-01-02 DIAGNOSIS — J3089 Other allergic rhinitis: Secondary | ICD-10-CM | POA: Diagnosis not present

## 2020-01-02 DIAGNOSIS — J301 Allergic rhinitis due to pollen: Secondary | ICD-10-CM | POA: Diagnosis not present

## 2020-01-02 DIAGNOSIS — J3081 Allergic rhinitis due to animal (cat) (dog) hair and dander: Secondary | ICD-10-CM | POA: Diagnosis not present

## 2020-01-08 DIAGNOSIS — J301 Allergic rhinitis due to pollen: Secondary | ICD-10-CM | POA: Diagnosis not present

## 2020-01-08 DIAGNOSIS — J3081 Allergic rhinitis due to animal (cat) (dog) hair and dander: Secondary | ICD-10-CM | POA: Diagnosis not present

## 2020-01-08 DIAGNOSIS — J3089 Other allergic rhinitis: Secondary | ICD-10-CM | POA: Diagnosis not present

## 2020-01-22 DIAGNOSIS — J301 Allergic rhinitis due to pollen: Secondary | ICD-10-CM | POA: Diagnosis not present

## 2020-01-22 DIAGNOSIS — J3089 Other allergic rhinitis: Secondary | ICD-10-CM | POA: Diagnosis not present

## 2020-01-22 DIAGNOSIS — J3081 Allergic rhinitis due to animal (cat) (dog) hair and dander: Secondary | ICD-10-CM | POA: Diagnosis not present

## 2020-02-05 DIAGNOSIS — J3089 Other allergic rhinitis: Secondary | ICD-10-CM | POA: Diagnosis not present

## 2020-02-05 DIAGNOSIS — J3081 Allergic rhinitis due to animal (cat) (dog) hair and dander: Secondary | ICD-10-CM | POA: Diagnosis not present

## 2020-02-05 DIAGNOSIS — J301 Allergic rhinitis due to pollen: Secondary | ICD-10-CM | POA: Diagnosis not present

## 2020-02-26 DIAGNOSIS — J3089 Other allergic rhinitis: Secondary | ICD-10-CM | POA: Diagnosis not present

## 2020-02-26 DIAGNOSIS — J3081 Allergic rhinitis due to animal (cat) (dog) hair and dander: Secondary | ICD-10-CM | POA: Diagnosis not present

## 2020-02-26 DIAGNOSIS — J301 Allergic rhinitis due to pollen: Secondary | ICD-10-CM | POA: Diagnosis not present

## 2020-03-04 DIAGNOSIS — I2510 Atherosclerotic heart disease of native coronary artery without angina pectoris: Secondary | ICD-10-CM | POA: Diagnosis not present

## 2020-03-04 DIAGNOSIS — I10 Essential (primary) hypertension: Secondary | ICD-10-CM | POA: Diagnosis not present

## 2020-03-12 DIAGNOSIS — J3089 Other allergic rhinitis: Secondary | ICD-10-CM | POA: Diagnosis not present

## 2020-03-12 DIAGNOSIS — L509 Urticaria, unspecified: Secondary | ICD-10-CM | POA: Diagnosis not present

## 2020-03-12 DIAGNOSIS — L209 Atopic dermatitis, unspecified: Secondary | ICD-10-CM | POA: Diagnosis not present

## 2020-03-12 DIAGNOSIS — J3081 Allergic rhinitis due to animal (cat) (dog) hair and dander: Secondary | ICD-10-CM | POA: Diagnosis not present

## 2020-03-12 DIAGNOSIS — J301 Allergic rhinitis due to pollen: Secondary | ICD-10-CM | POA: Diagnosis not present

## 2020-03-18 DIAGNOSIS — I2510 Atherosclerotic heart disease of native coronary artery without angina pectoris: Secondary | ICD-10-CM | POA: Diagnosis not present

## 2020-03-18 DIAGNOSIS — O99419 Diseases of the circulatory system complicating pregnancy, unspecified trimester: Secondary | ICD-10-CM | POA: Diagnosis not present

## 2020-03-18 DIAGNOSIS — O161 Unspecified maternal hypertension, first trimester: Secondary | ICD-10-CM | POA: Diagnosis not present

## 2020-03-18 DIAGNOSIS — Z3A00 Weeks of gestation of pregnancy not specified: Secondary | ICD-10-CM | POA: Diagnosis not present

## 2020-03-18 DIAGNOSIS — N281 Cyst of kidney, acquired: Secondary | ICD-10-CM | POA: Diagnosis not present

## 2020-03-20 ENCOUNTER — Encounter: Payer: Self-pay | Admitting: *Deleted

## 2020-03-25 ENCOUNTER — Other Ambulatory Visit: Payer: Self-pay

## 2020-03-25 ENCOUNTER — Other Ambulatory Visit: Payer: Self-pay | Admitting: Obstetrics & Gynecology

## 2020-03-25 ENCOUNTER — Ambulatory Visit (INDEPENDENT_AMBULATORY_CARE_PROVIDER_SITE_OTHER): Payer: Medicaid Other | Admitting: Obstetrics & Gynecology

## 2020-03-25 ENCOUNTER — Encounter: Payer: Self-pay | Admitting: Obstetrics & Gynecology

## 2020-03-25 ENCOUNTER — Ambulatory Visit: Payer: Medicaid Other

## 2020-03-25 ENCOUNTER — Other Ambulatory Visit (HOSPITAL_COMMUNITY)
Admission: RE | Admit: 2020-03-25 | Discharge: 2020-03-25 | Disposition: A | Discharge status: Home / Self Care | Payer: Medicaid Other | Source: Ambulatory Visit | Attending: Obstetrics & Gynecology | Admitting: Obstetrics & Gynecology

## 2020-03-25 VITALS — BP 126/80 | HR 74 | Wt 197.0 lb

## 2020-03-25 DIAGNOSIS — O0990 Supervision of high risk pregnancy, unspecified, unspecified trimester: Secondary | ICD-10-CM | POA: Insufficient documentation

## 2020-03-25 DIAGNOSIS — Z3491 Encounter for supervision of normal pregnancy, unspecified, first trimester: Secondary | ICD-10-CM

## 2020-03-25 DIAGNOSIS — R7989 Other specified abnormal findings of blood chemistry: Secondary | ICD-10-CM

## 2020-03-25 LAB — TSH: TSH: 1.26 mIU/L

## 2020-03-25 LAB — GLUCOSE, POCT (MANUAL RESULT ENTRY): POC Glucose: 79 mg/dl (ref 70–99)

## 2020-03-25 NOTE — Progress Notes (Signed)
Bedside U/S shows single IUP with FHT of 1754 BPM and CRL measures 31.24mm  GA [redacted]w[redacted]d

## 2020-03-25 NOTE — Progress Notes (Signed)
Subjective:    Carol Wright is a [id number] [redacted]w[redacted]d being seen today for her first obstetrical visit.  Her obstetrical history is significant for obesity, pre-eclampsia and c/s x2. Patient does intend to breast feed. Pregnancy history fully reviewed.  Pt has a cardiac history significant for In 2013 when she was 5 months pregnant the patient suffered coronary dissection. She underwent PCI with a bare-metal stent by her report. Her LV function was initially reduced but improved by report.  Pt had a second pregnancy with no CV issues.  Pt is here with 3rd pregnancy and wishes to deliver at [facility]   Patient reports no complaints.  Vitals:   03/25/20 0851  BP: 126/80  Pulse: 74  Weight: 197 lb (89.4 kg)    HISTORY: OB History  Gravida Para Term Preterm AB Living  3 2 0 2   2  SAB IAB Ectopic Multiple Live Births               # Outcome Date GA Lbr Len/2nd Weight Sex Delivery Anes PTL Lv  3 Current           2 Preterm      CS-LTranv     1 Preterm      CS-LTranv      Past Medical History:  Diagnosis Date  . Depression   . Generalized anxiety disorder   . H/O arterial dissection   . Hepatic steatosis    Via ultrasound.    . Hyperlipidemia   . Myocardial infarction ([place]) 2013  . Obesity (BMI 30-39.9)   . PCOS (polycystic ovarian syndrome)   . Pre-eclampsia   . Spontaneous dissection of coronary artery    a. occurred in 2013 during pregnany. Underwent placement of BMS per the patient's report.    Past Surgical History:  Procedure Laterality Date  . Arterial disection  2013  . Bare metal stent  2013  . CESAREAN SECTION  2013   Family History  Problem Relation Age of Onset  . Hypertension Mother   . Hypertension Maternal Grandmother   . Heart attack Maternal Grandfather      Exam    Uterus:     Pelvic Exam:    Perineum: No Hemorrhoids   Vulva: normal   Vagina:  normal mucosa, normal discharge   pH: n/a   Cervix: no cervical motion tenderness and no lesions    Adnexa: no mass, fullness, tenderness   Bony Pelvis: average  System: Breast:  normal appearance, no masses or tenderness   Skin: normal coloration and turgor, no rashes    Neurologic: oriented, normal mood   Extremities: no deformities   HEENT sclera clear, anicteric, neck supple with midline trachea and trachea midline   Mouth/Teeth mucous membranes moist, pharynx normal without lesions   Neck supple and no masses   Cardiovascular: regular rate and rhythm   Respiratory:  appears well, vitals normal, no respiratory distress, acyanotic, normal RR, chest clear, no wheezing, crepitations, rhonchi, normal symmetric air entry   Abdomen: soft, non-tender; bowel sounds normal; no masses,  no organomegaly   Urinary: urethral meatus normal      Assessment:    Pregnancy: [id number] Patient Active Problem List   Diagnosis Date Noted  . Supervision of high risk pregnancy, antepartum 03/25/2020  . Morbid obesity ([place]) 11/13/2019  . Ventricular aneurysm 04/22/2018  . H/O: cesarean section 10/11/2016  . 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 Low vitamin D level 06/22/2016  . Hypertension 05/06/2016  .  Infertility management 05/06/2016  . PCO (polycystic ovaries) 01/16/2016  . Elevated blood pressure reading 06/22/2014  . Hx of myocardial infarction 06/20/2013  . Hepatic steatosis 06/20/2013  . Depression 06/13/2013  . Generalized anxiety disorder 06/13/2013        Plan:     Initial labs drawn. Prenatal vitamins. Problem list reviewed and updated. Genetic Screening:  NIPS and AFP Ultrasound discussed; fetal survey: ordered.  18-20 weeks Baby Rx home BP monitoring (has cuff) Check Vitamin D level, TSH  1.  Hx of cardiac disease and needs to be follow by [place] if hse plans delviery at [facility].  Note sent to Dr. Tempie Hoist to make sure she ia an appropropriate candidate for delivery at [facility].  Pt already on baby asa.  Continue labetalol.    2.  Hx PCO:   Pt on metformin for conception.  She will continue.  Fasting CBG is 79.  Will get Hgb A1C.  If both normal, then routine screening at 28 weeks.   3.  Plans for Rpt c/s.  Unsure of desired family size.  Would be open for BTL if finished with child bearing.   Follow up in 4 weeks.     Silas [place] 03/25/2020

## 2020-03-25 NOTE — Patient Instructions (Signed)
Obstetrics: Normal and Problem Pregnancies (7th ed., pp. 102-121). [place], [place]: Elsevier."> Textbook of Family Medicine (9th ed., pp. [phone]). [place], [place]: Else[employer].">  First Trimester of Pregnancy  The first trimester of pregnancy starts on the first day of your last menstrual period until the end of week 12. This is months 1 through 3 of pregnancy. A week after a sperm fertilizes an egg, the egg will implant into the wall of the uterus and begin to develop into a baby. By the end of 12 weeks, all the baby's organs will be formed and the baby will be 2-3 inches in size. Body changes during your first trimester Your body goes through many changes during pregnancy. The changes vary and generally return to normal after your baby is born. Physical changes  You may gain or lose weight.  Your breasts may begin to grow larger and become tender. The tissue that surrounds your nipples (areola) may become darker.  Dark spots or blotches (chloasma or mask of pregnancy) may develop on your face.  You may have changes in your hair. These can include thickening or thinning of your hair or changes in texture. Health changes  You may feel nauseous, and you may vomit.  You may have heartburn.  You may develop headaches.  You may develop constipation.  Your gums may bleed and may be sensitive to brushing and flossing. Other changes  You may tire easily.  You may urinate more often.  Your menstrual periods will stop.  You may have a loss of appetite.  You may develop cravings for certain kinds of food.  You may have changes in your emotions from day to day.  You may have more vivid and strange dreams. Follow these instructions at home: Medicines  Follow your health care provider's instructions regarding medicine use. Specific medicines may be either safe or unsafe to take during pregnancy. Do not take any medicines unless told to by your health care provider.  Take  a prenatal vitamin that contains at least 600 micrograms (mcg) of folic acid. Eating and drinking  Eat a healthy diet that includes fresh fruits and vegetables, whole grains, good sources of protein such as meat, eggs, or tofu, and low-fat dairy products.  Avoid raw meat and unpasteurized juice, milk, and cheese. These carry germs that can harm you and your baby.  If you feel nauseous or you vomit: ? Eat 4 or 5 small meals a day instead of 3 large meals. ? Try eating a few soda crackers. ? Drink liquids between meals instead of during meals.  You may need to take these actions to prevent or treat constipation: ? Drink enough fluid to keep your urine pale yellow. ? Eat foods that are high in fiber, such as beans, whole grains, and fresh fruits and vegetables. ? Limit foods that are high in fat and processed sugars, such as fried or sweet foods. Activity  Exercise only as directed by your health care provider. Most people can continue their usual exercise routine during pregnancy. Try to exercise for 30 minutes at least 5 days a week.  Stop exercising if you develop pain or cramping in the lower abdomen or lower back.  Avoid exercising if it is very hot or humid or if you are at high altitude.  Avoid heavy lifting.  If you choose to, you may have sex unless your health care provider tells you not to. Relieving pain and discomfort  Wear a good support bra to relieve  tenderness.  Rest with your legs elevated if you have leg cramps or low back pain.  If you develop bulging veins (varicose veins) in your legs: ? Wear support hose as told by your health care provider. ? Elevate your feet for 15 minutes, 3-4 times a day. ? Limit salt in your diet. Safety  Wear your seat belt at all times when driving or riding in a car.  Talk with your health care provider if someone is verbally or physically abusive to you.  Talk with your health care provider if you are feeling sad or have  thoughts of hurting yourself. Lifestyle  Do not use hot tubs, steam rooms, or saunas.  Do not douche. Do not use tampons or scented sanitary pads.  Do not use herbal remedies, alcohol, illegal drugs, or medicines that are not approved by your health care provider. Chemicals in these products can harm your baby.  Do not use any products that contain nicotine or tobacco, such as cigarettes, e-cigarettes, and chewing tobacco. If you need help quitting, ask your health care provider.  Avoid cat litter boxes and soil used by cats. These carry germs that can cause birth defects in the baby and possibly loss of the unborn baby (fetus) by miscarriage or stillbirth. General instructions  During routine prenatal visits in the first trimester, your health care provider will do a physical exam, perform necessary tests, and ask you how things are going. Keep all follow-up visits. This is important.  Ask for help if you have counseling or nutritional needs during pregnancy. Your health care provider can offer advice or refer you to specialists for help with various needs.  Schedule a dentist appointment. At home, brush your teeth with a soft toothbrush. Floss gently.  Write down your questions. Take them to your prenatal visits. Where to find more information  American Pregnancy Association: americanpregnancy.org  American College of Obstetricians and Gynecologists: acog.org/en/Womens%20Health/Pregnancy  Office on Women's Health: womenshealth.gov/pregnancy Contact a health care provider if you have:  Dizziness.  A fever.  Mild pelvic cramps, pelvic pressure, or nagging pain in the abdominal area.  Nausea, vomiting, or diarrhea that lasts for 24 hours or longer.  A bad-smelling vaginal discharge.  Pain when you urinate.  Known exposure to a contagious illness, such as chickenpox, measles, Zika virus, HIV, or hepatitis. Get help right away if you have:  Spotting or bleeding from your  vagina.  Severe abdominal cramping or pain.  Shortness of breath or chest pain.  Any kind of trauma, such as from a fall or a car crash.  New or increased pain, swelling, or redness in an arm or leg. Summary  The first trimester of pregnancy starts on the first day of your last menstrual period until the end of week 12 (months 1 through 3).  Eating 4 or 5 small meals a day rather than 3 large meals may help to relieve nausea and vomiting.  Do not use any products that contain nicotine or tobacco, such as cigarettes, e-cigarettes, and chewing tobacco. If you need help quitting, ask your health care provider.  Keep all follow-up visits. This is important. This information is not intended to replace advice given to you by your health care provider. Make sure you discuss any questions you have with your health care provider. Document Revised: 08/09/2019 Document Reviewed: 06/15/2019 Elsevier Patient Education  2021 Elsevier Inc.  

## 2020-03-26 LAB — OBSTETRIC PANEL
Absolute Monocytes: 686 cells/uL (ref 200–950)
Antibody Screen: NOT DETECTED
Basophils Absolute: 52 cells/uL (ref 0–200)
Basophils Relative: 0.5 %
Eosinophils Absolute: 156 cells/uL (ref 15–500)
Eosinophils Relative: 1.5 %
HCT: 37.4 % (ref 35.0–45.0)
Hemoglobin: 12.9 g/dL (ref 11.7–15.5)
Hepatitis B Surface Ag: NONREACTIVE
Lymphs Abs: 1820 cells/uL (ref 850–3900)
MCH: 30.1 pg (ref 27.0–33.0)
MCHC: 34.5 g/dL (ref 32.0–36.0)
MCV: 87.2 fL (ref 80.0–100.0)
MPV: 11.7 fL (ref 7.5–12.5)
Monocytes Relative: 6.6 %
Neutro Abs: 7686 cells/uL (ref 1500–7800)
Neutrophils Relative %: 73.9 %
Platelets: 209 10*3/uL (ref 140–400)
RBC: 4.29 10*6/uL (ref 3.80–5.10)
RDW: 13 % (ref 11.0–15.0)
RPR Ser Ql: NONREACTIVE
Rubella: 5.37 Index
Total Lymphocyte: 17.5 %
WBC: 10.4 10*3/uL (ref 3.8–10.8)

## 2020-03-26 LAB — HEMOGLOBIN A1C
Hgb A1c MFr Bld: 4.5 % of total Hgb (ref <5.7)
Mean Plasma Glucose: 82 mg/dL
eAG (mmol/L): 4.6 mmol/L

## 2020-03-26 LAB — HEPATITIS C ANTIBODY
Hepatitis C Ab: NONREACTIVE
SIGNAL TO CUT-OFF: 0.04 (ref <1.00)

## 2020-03-26 LAB — CERVICOVAGINAL ANCILLARY ONLY
Chlamydia: NEGATIVE
Comment: NEGATIVE
Comment: NORMAL
Neisseria Gonorrhea: NEGATIVE

## 2020-03-26 LAB — HIV ANTIBODY (ROUTINE TESTING W REFLEX): HIV 1&2 Ab, 4th Generation: NONREACTIVE

## 2020-03-28 LAB — CULTURE, OB URINE

## 2020-03-28 LAB — URINE CULTURE, OB REFLEX

## 2020-04-08 ENCOUNTER — Ambulatory Visit (INDEPENDENT_AMBULATORY_CARE_PROVIDER_SITE_OTHER): Payer: Medicaid Other | Admitting: *Deleted

## 2020-04-08 ENCOUNTER — Other Ambulatory Visit: Payer: Self-pay

## 2020-04-08 DIAGNOSIS — Z36 Encounter for antenatal screening for chromosomal anomalies: Secondary | ICD-10-CM

## 2020-04-08 DIAGNOSIS — Z3481 Encounter for supervision of other normal pregnancy, first trimester: Secondary | ICD-10-CM | POA: Diagnosis not present

## 2020-04-08 DIAGNOSIS — O0990 Supervision of high risk pregnancy, unspecified, unspecified trimester: Secondary | ICD-10-CM

## 2020-04-08 NOTE — Progress Notes (Signed)
Blood drawn for NIPS

## 2020-04-18 ENCOUNTER — Encounter: Payer: Self-pay | Admitting: *Deleted

## 2020-04-22 ENCOUNTER — Other Ambulatory Visit: Payer: Self-pay

## 2020-04-22 ENCOUNTER — Ambulatory Visit (INDEPENDENT_AMBULATORY_CARE_PROVIDER_SITE_OTHER): Payer: Medicaid Other | Admitting: Obstetrics & Gynecology

## 2020-04-22 VITALS — BP 117/72 | HR 83 | Wt 199.0 lb

## 2020-04-22 DIAGNOSIS — I10 Essential (primary) hypertension: Secondary | ICD-10-CM

## 2020-04-22 DIAGNOSIS — E282 Polycystic ovarian syndrome: Secondary | ICD-10-CM | POA: Diagnosis not present

## 2020-04-22 DIAGNOSIS — R7989 Other specified abnormal findings of blood chemistry: Secondary | ICD-10-CM

## 2020-04-22 DIAGNOSIS — O0990 Supervision of high risk pregnancy, unspecified, unspecified trimester: Secondary | ICD-10-CM

## 2020-04-22 LAB — GLUCOSE, POCT (MANUAL RESULT ENTRY): POC Glucose: 98 mg/dl (ref 70–99)

## 2020-04-22 NOTE — Progress Notes (Signed)
   PRENATAL VISIT NOTE  Subjective:  Carol Wright is a 35 y.o. [id number] at [redacted]w[redacted]d being seen today for ongoing prenatal care.  She is currently monitored for the following issues for this high-risk pregnancy and has Depression; Generalized anxiety disorder; Hx of myocardial infarction; Hepatic steatosis; Elevated blood pressure reading; PCO (polycystic ovaries); Hypertension; Low vitamin D level; Spontaneous dissection of coronary artery; H/O: cesarean section; Ventricular aneurysm; Morbid obesity ([place]); and Supervision of high risk pregnancy, antepartum on their problem list.  Patient reports no complaints.   . Vag. Bleeding: None.   . Denies leaking of fluid.   The following portions of the patient's history were reviewed and updated as appropriate: allergies, current medications, past family history, past medical history, past social history, past surgical history and problem list.   Objective:   Vitals:   04/22/20 0906  BP: 117/72  Pulse: 83  Weight: 199 lb (90.3 kg)    Fetal Status: Fetal Heart Rate (bpm): 154         General:  Alert, oriented and cooperative. Patient is in no acute distress.  Skin: Skin is warm and dry. No rash noted.   Cardiovascular: Normal heart rate noted  Respiratory: Normal respiratory effort, no problems with respiration noted  Abdomen: Soft, gravid, appropriate for gestational age.        Pelvic: Cervical exam deferred        Extremities: Normal range of motion.  Edema: None  Mental Status: Normal mood and affect. Normal behavior. Normal judgment and thought content.   Assessment and Plan:  Pregnancy: [id number] at [redacted]w[redacted]d 1. Supervision of high risk pregnancy, antepartum Doing well; has anatomy scheduled.  Needs AFP at next visit.  NIPS normal  2. PCO (polycystic ovaries) Continue metformin.  One hour pp today after french toast is 98  3. Low vitamin D level Draw at next visit  4. Primary hypertension Continue labetalol.  BPs normal and crossing  into chart.    5.  Hx Spontaneous dissection of coronary artery Pt needs cardiology appt with [facility].  She will make appt.  We will discuss her case in high risk Ob meeting   Preterm labor symptoms and general obstetric precautions including but not limited to vaginal bleeding, contractions, leaking of fluid and fetal movement were reviewed in detail with the patient. Please refer to After Visit Summary for other counseling recommendations.   No follow-ups on file.  Future Appointments  Date Time Provider [place]  05/27/2020  8:15 AM [facility] NURSE [facility]  05/27/2020  8:30 AM [facility] US3 [facility]  05/27/2020  1:15 PM Somaly Marteney, Fredderick Phenix, MD [facility]    Silas Sacramento, MD

## 2020-04-29 ENCOUNTER — Encounter: Payer: Self-pay | Admitting: *Deleted

## 2020-04-29 DIAGNOSIS — O0990 Supervision of high risk pregnancy, unspecified, unspecified trimester: Secondary | ICD-10-CM

## 2020-05-12 NOTE — Progress Notes (Signed)
Cardiology Office Note   Date:  05/13/2020   ID:  Carol Wright, DOB 1985/08/30, MRN [id number]  PCP:  Donella Stade, PA-C  Cardiologist:   No primary care provider on file. Referring:  Guss Bunde, MD  Chief Complaint  Patient presents with  . Coronary Artery Disease      History of Present Illness: Carol Wright is a 35 y.o. female who presents for evaluation of coronary artery disease. In 2013 when she was 5 months pregnant the patient suffered coronary dissection. She underwent PCI with a bare-metal stent by her report. Her LV function was initially reduced but improved by report.    It has been over three years since I last saw her.   She was pregnant at that time.  She eventually had her baby at [facility].  She had no problems with this.  She is actually been seeing a cardiologist there.  I was able to review some records.  Recently she had renal Dopplers although not sure the indication and these were unremarkable.  I reviewed these results.  She had her last echocardiogram that I see was last year.  Her EF was 50 to 55% with some mild anterior hypokinesis.  She is now [redacted] weeks pregnant.  She has done well.  She is going to have a planned C-section in August.  The patient denies any new symptoms such as chest discomfort, neck or arm discomfort. There has been no new shortness of breath, PND or orthopnea. There have been no reported palpitations, presyncope or syncope.  She takes care of her 94-year-old and her 24-year-old and also her grandparents.  She has to go up and down stairs.  She denies any cardiovascular symptoms.  She had no problems with her last C-section.  She has had none of the symptoms that she had when she had a bare-metal stent.  Of note I did look through the results and we never had the records from [place] where this procedure was done.  Past Medical History:  Diagnosis Date  . Depression   . Generalized anxiety disorder   . H/O arterial dissection    . Hepatic steatosis    Via ultrasound.    . Hyperlipidemia   . Myocardial infarction ([place]) 2013  . Obesity (BMI 30-39.9)   . PCOS (polycystic ovarian syndrome)   . Pre-eclampsia   . Spontaneous dissection of coronary artery    a. occurred in 2013 during pregnany. Underwent placement of BMS per the patient's report.     Past Surgical History:  Procedure Laterality Date  . Arterial disection  2013  . Bare metal stent  2013  . CESAREAN SECTION  2013     Current Outpatient Medications  Medication Sig Dispense Refill  . aspirin 81 MG tablet Take 81 mg by mouth daily.    . cetirizine (ZYRTEC) 10 MG tablet Take 10 mg by mouth daily. Reported on 10/04/2015    . labetalol (NORMODYNE) 100 MG tablet Take 100 mg by mouth 2 (two) times daily.    . metFORMIN (GLUCOPHAGE) 500 MG tablet Take 1 tablet (500 mg total) by mouth 2 (two) times daily with a meal. 180 tablet 1  . Prenatal Vit-Fe Fumarate-FA (MULTIVITAMIN-PRENATAL) 27-0.8 MG TABS tablet Take 1 tablet by mouth daily.     No current facility-administered medications for this visit.    Allergies:   Patient has no known allergies.    Social History:  The patient  reports that she has  never smoked. She has never used smokeless tobacco. She reports that she does not drink alcohol and does not use drugs.   Family History:  The patient's family history includes Heart attack in her maternal grandfather; Hypertension in her maternal grandmother and mother.    ROS:  Please see the history of present illness.   Otherwise, review of systems are positive for none.   All other systems are reviewed and negative.    PHYSICAL EXAM: VS:  BP 140/90   Pulse 85   Ht 5\' 3"  (1.6 m)   Wt 205 lb (93 kg)   LMP 01/10/2020   SpO2 98%   BMI 36.31 kg/m  , BMI Body mass index is 36.31 kg/m. GENERAL:  Well appearing HEENT:  Pupils equal round and reactive, fundi not visualized, oral mucosa unremarkable NECK:  No jugular venous distention, waveform  within normal limits, carotid upstroke brisk and symmetric, no bruits, no thyromegaly LYMPHATICS:  No cervical, inguinal adenopathy LUNGS:  Clear to auscultation bilaterally BACK:  No CVA tenderness CHEST:  Unremarkable HEART:  PMI not displaced or sustained,S1 and S2 within normal limits, no S3, no S4, no clicks, no rubs, no murmurs ABD:  Flat, positive bowel sounds normal in frequency in pitch, no bruits, no rebound, no guarding, no midline pulsatile mass, no hepatomegaly, no splenomegaly EXT:  2 plus pulses throughout, no edema, no cyanosis no clubbing SKIN:  No rashes no nodules NEURO:  Cranial nerves II through XII grossly intact, motor grossly intact throughout PSYCH:  Cognitively intact, oriented to person place and time    EKG:  EKG is ordered today. The ekg ordered today demonstrates sinus rhythm, rate 85, axis within normal limits, intervals within normal limits, poor anterior R wave progression suggestive of an old anteroseptal infarct.   Recent Labs: 11/13/2019: ALT 50; BUN 11; Creat 0.61; Potassium 3.8; Sodium 138 03/25/2020: Hemoglobin 12.9; Platelets 209; TSH 1.26    Lipid Panel    Component Value Date/Time   CHOL 149 11/13/2019 1110   TRIG 121 11/13/2019 1110   HDL 33 (L) 11/13/2019 1110   CHOLHDL 4.5 11/13/2019 1110   VLDL 32 06/20/2013 0959   LDLCALC 94 11/13/2019 1110      Wt Readings from Last 3 Encounters:  05/13/20 205 lb (93 kg)  04/22/20 199 lb (90.3 kg)  03/25/20 197 lb (89.4 kg)      Other studies Reviewed: Additional studies/ records that were reviewed today include: None. Review of the above records demonstrates:  Please see elsewhere in the note.     ASSESSMENT AND PLAN:  CORONARY ARTERY DISSECTION:   We are asked to be involved so that we can be available should she have any issues at the time of her scheduled C-section.  Currently she is not having any symptoms and does not need any change in her therapy.  She does have follow-up routinely  with a cardiologist and will continue with this.  We will be available as needed.  HTN: Her blood pressure is higher than I would like to see but she says it is in the 120s over 70s which would be appropriate.  She no significant ends up being more about mid 130s over 80s I would suggest increasing her labetalol to 3 times daily.  REDUCED EF: This was mildly reduced and stable as above.  No change in therapy.     Current medicines are reviewed at length with the patient today.  The patient does not have concerns regarding medicines.  The following changes have been made:  no change  Labs/ tests ordered today include: None  Orders Placed This Encounter  Procedures  . EKG 12-Lead     Disposition:   FU with as needed   Signed, Minus Breeding, MD  05/13/2020 10:48 AM    [place]

## 2020-05-13 ENCOUNTER — Ambulatory Visit (INDEPENDENT_AMBULATORY_CARE_PROVIDER_SITE_OTHER): Payer: Medicaid Other | Admitting: Cardiology

## 2020-05-13 ENCOUNTER — Encounter: Payer: Self-pay | Admitting: Cardiology

## 2020-05-13 ENCOUNTER — Other Ambulatory Visit: Payer: Self-pay

## 2020-05-13 VITALS — BP 140/90 | HR 85 | Ht 63.0 in | Wt 205.0 lb

## 2020-05-13 DIAGNOSIS — I10 Essential (primary) hypertension: Secondary | ICD-10-CM

## 2020-05-13 NOTE — Patient Instructions (Signed)
Medication Instructions:  No changes *If you need a refill on your cardiac medications before your next appointment, please call your pharmacy*  Follow-Up: At [facility], you and your health needs are our priority.  As part of our continuing mission to provide you with exceptional heart care, we have created designated Provider Care Teams.  These Care Teams include your primary Cardiologist (physician) and Advanced Practice Providers (APPs -  Physician Assistants and Nurse Practitioners) who all work together to provide you with the care you need, when you need it.  Your next appointment:   Follow up as needed

## 2020-05-27 ENCOUNTER — Encounter: Payer: Self-pay | Admitting: *Deleted

## 2020-05-27 ENCOUNTER — Ambulatory Visit (INDEPENDENT_AMBULATORY_CARE_PROVIDER_SITE_OTHER): Payer: Medicaid Other | Admitting: Obstetrics & Gynecology

## 2020-05-27 ENCOUNTER — Ambulatory Visit: Payer: Medicaid Other | Admitting: *Deleted

## 2020-05-27 ENCOUNTER — Ambulatory Visit: Payer: Medicaid Other | Attending: Physician Assistant

## 2020-05-27 ENCOUNTER — Other Ambulatory Visit: Payer: Self-pay

## 2020-05-27 ENCOUNTER — Other Ambulatory Visit: Payer: Self-pay | Admitting: *Deleted

## 2020-05-27 VITALS — BP 129/80 | HR 96 | Wt 205.0 lb

## 2020-05-27 DIAGNOSIS — E559 Vitamin D deficiency, unspecified: Secondary | ICD-10-CM

## 2020-05-27 DIAGNOSIS — I252 Old myocardial infarction: Secondary | ICD-10-CM

## 2020-05-27 DIAGNOSIS — Z3A19 19 weeks gestation of pregnancy: Secondary | ICD-10-CM

## 2020-05-27 DIAGNOSIS — O34219 Maternal care for unspecified type scar from previous cesarean delivery: Secondary | ICD-10-CM

## 2020-05-27 DIAGNOSIS — Z363 Encounter for antenatal screening for malformations: Secondary | ICD-10-CM

## 2020-05-27 DIAGNOSIS — O0990 Supervision of high risk pregnancy, unspecified, unspecified trimester: Secondary | ICD-10-CM

## 2020-05-27 MED ORDER — METFORMIN HCL 500 MG PO TABS: 500.0000 mg | ORAL_TABLET | Freq: Two times a day (BID) | ORAL | 2 refills | Status: DC

## 2020-05-27 NOTE — Progress Notes (Signed)
Pain in right shoulder    PRENATAL VISIT NOTE  Subjective:  Carol Wright is a 35 y.o. G3P0202 at [redacted]w[redacted]d being seen today for ongoing prenatal care.  She is currently monitored for the following issues for this high-risk pregnancy and has Depression; Generalized anxiety disorder; Hx of myocardial infarction; Hepatic steatosis; Elevated blood pressure reading; PCO (polycystic ovaries); Hypertension; Low vitamin D level; Spontaneous dissection of coronary artery; H/O: cesarean section; Ventricular aneurysm; Morbid obesity ([place]); and Supervision of high risk pregnancy, antepartum on their problem list.  Patient reports right intermittent shoulder pain (mostly in joint) and waves of nausea..  Contractions: Not present. Vag. Bleeding: None.  Movement: Present. Denies leaking of fluid.   The following portions of the patient's history were reviewed and updated as appropriate: allergies, current medications, past family history, past medical history, past social history, past surgical history and problem list.   Objective:   Vitals:   05/27/20 1319  BP: 129/80  Pulse: 96  Weight: 205 lb (93 kg)    Fetal Status: Fetal Heart Rate (bpm): 151   Movement: Present     General:  Alert, oriented and cooperative. Patient is in no acute distress.  Skin: Skin is warm and dry. No rash noted.   Cardiovascular: Normal heart rate noted  Respiratory: Normal respiratory effort, no problems with respiration noted  Abdomen: Soft, gravid, appropriate for gestational age.  Pain/Pressure: Absent     Pelvic: Cervical exam deferred        Extremities: Normal range of motion.  Edema: None  Mental Status: Normal mood and affect. Normal behavior. Normal judgment and thought content.   Assessment and Plan:  Pregnancy: [id number] at [redacted]w[redacted]d 1. Vitamin D deficiency Check level - Vitamin D (25 hydroxy)  2. Supervision of high risk pregnancy, antepartum - Alpha fetoprotein, maternal  3. [redacted] weeks gestation of  pregnancy Anatomy US normal with limited views.  Has f/u scheduled  4.  Shoulder Pain and nausea Given cardiac history and vague new complaints, will get EKG.  Pt understands to go do ED if she has CP or other concerns of AMI or cardiac disease.    5.  Hypertension Goal BP is less than 130/80; will increase labetalol as necessary  Preterm labor symptoms and general obstetric precautions including but not limited to vaginal bleeding, contractions, leaking of fluid and fetal movement were reviewed in detail with the patient. Please refer to After Visit Summary for other counseling recommendations.   No follow-ups on file.  Future Appointments  Date Time Provider [place]  06/17/2020  8:45 AM Guss Bunde, MD [facility]  06/24/2020  9:15 AM [facility] NURSE [facility]  06/24/2020  9:30 AM [facility] US3 [facility]    Silas [place], MD

## 2020-05-28 ENCOUNTER — Telehealth: Payer: Self-pay | Admitting: Cardiology

## 2020-05-28 ENCOUNTER — Ambulatory Visit (INDEPENDENT_AMBULATORY_CARE_PROVIDER_SITE_OTHER): Payer: Medicaid Other | Admitting: *Deleted

## 2020-05-28 VITALS — HR 82

## 2020-05-28 DIAGNOSIS — I10 Essential (primary) hypertension: Secondary | ICD-10-CM | POA: Diagnosis not present

## 2020-05-28 DIAGNOSIS — R110 Nausea: Secondary | ICD-10-CM

## 2020-05-28 DIAGNOSIS — O0990 Supervision of high risk pregnancy, unspecified, unspecified trimester: Secondary | ICD-10-CM | POA: Diagnosis not present

## 2020-05-28 DIAGNOSIS — E559 Vitamin D deficiency, unspecified: Secondary | ICD-10-CM | POA: Diagnosis not present

## 2020-05-28 NOTE — Progress Notes (Signed)
1.) Reason for visit: EKG only  2.) Name of MD requesting visit: Dr Percival Spanish  3.) H&P:   ROS related to problem:   Received a message from Dr Percival Spanish on 05/27/20. Per Dr Percival Spanish ,patient was to come today  05/28/20 to have EKG done.  4.) Assessment and plan per MD: Patient came to office.  Patient not in distress, no chest discomfort. Patient states she has been having some bouts of nausea and her ob/gyn spoke to Dr Percival Spanish. She need an EKG to be done.  EKG done  Heart rate 82  Sinus rhythm .  Reviewed and signed by Dr Percival Spanish. Patient was informed of Results. Patient verbalized understanding. Patient left the office.

## 2020-05-28 NOTE — Telephone Encounter (Signed)
Spoke with patient regarding EKG visit ordered by Dr. Esaw Grandchild to arrive today 05/28/20 at 12:45pm for a 1:00pm appointment.  Patient voiced her understanding.Carol Wright

## 2020-05-29 LAB — ALPHA FETOPROTEIN, MATERNAL
AFP MoM: 1.37
AFP, Serum: 57.5 ng/mL
Calc'd Gestational Age: 19.3 weeks
Maternal Wt: 205 [lb_av]
Risk for ONTD: 1
Twins-AFP: 1

## 2020-05-29 LAB — VITAMIN D 25 HYDROXY (VIT D DEFICIENCY, FRACTURES): Vit D, 25-Hydroxy: 34 ng/mL (ref 30–100)

## 2020-06-03 DIAGNOSIS — I2510 Atherosclerotic heart disease of native coronary artery without angina pectoris: Secondary | ICD-10-CM | POA: Diagnosis not present

## 2020-06-03 DIAGNOSIS — Z3492 Encounter for supervision of normal pregnancy, unspecified, second trimester: Secondary | ICD-10-CM | POA: Diagnosis not present

## 2020-06-03 DIAGNOSIS — I10 Essential (primary) hypertension: Secondary | ICD-10-CM | POA: Diagnosis not present

## 2020-06-17 ENCOUNTER — Ambulatory Visit (INDEPENDENT_AMBULATORY_CARE_PROVIDER_SITE_OTHER): Payer: Medicaid Other | Admitting: Obstetrics & Gynecology

## 2020-06-17 ENCOUNTER — Other Ambulatory Visit: Payer: Self-pay

## 2020-06-17 DIAGNOSIS — O0990 Supervision of high risk pregnancy, unspecified, unspecified trimester: Secondary | ICD-10-CM

## 2020-06-17 NOTE — Progress Notes (Signed)
   PRENATAL VISIT NOTE  Subjective:  Carol Wright is a 35 y.o. G3P0202 at [redacted]w[redacted]d being seen today for ongoing prenatal care.  She is currently monitored for the following issues for this high-risk pregnancy and has Depression; Generalized anxiety disorder; Hx of myocardial infarction; Hepatic steatosis; Elevated blood pressure reading; PCO (polycystic ovaries); Hypertension; Low vitamin D level; Spontaneous dissection of coronary artery; H/O: cesarean section; Ventricular aneurysm; Morbid obesity ([place]); and Supervision of high risk pregnancy, antepartum on their problem list.  Patient reports Patient states that eating better has lessened her GI side effects.  Nausea is gone.  Note her EKG was normal after last visit..  Contractions: Not present. Vag. Bleeding: None,[place].  Movement: Present. Denies leaking of fluid.   The following portions of the patient's history were reviewed and updated as appropriate: allergies, current medications, past family history, past medical history, past social history, past surgical history and problem list.   Objective:   Vitals:   06/17/20 0833  BP: 115/74  Pulse: 87  Weight: 210 lb 0.6 oz (95.3 kg)    Fetal Status: Fetal Heart Rate (bpm): 153   Movement: Present     General:  Alert, oriented and cooperative. Patient is in no acute distress.  Skin: Skin is warm and dry. No rash noted.   Cardiovascular: Normal heart rate noted  Respiratory: Normal respiratory effort, no problems with respiration noted  Abdomen: Soft, gravid, appropriate for gestational age.  Pain/Pressure: Absent     Pelvic: Cervical exam deferred        Extremities: Normal range of motion.  Edema: None  Mental Status: Normal mood and affect. Normal behavior. Normal judgment and thought content.   Assessment and Plan:  Pregnancy: [id number] at [redacted]w[redacted]d  1. Supervision of high risk pregnancy, antepartum Sent Dr. Pierre Bali note about newest echo report at [facility].   Her [facility]  cardiologists, Dr. Zenia Resides, said that he would start a medication to help her left atrium dilation except it would had adverse effects on the fetus.  I am unsure which medication he would like to start.  It is not in his care everywhere note.  We will consult with Dr. Pierre Bali Growth ultrasound scheduled. Blood pressures are stable on Labetalol 100 mg bid  Preterm labor symptoms and general obstetric precautions including but not limited to vaginal bleeding, contractions, leaking of fluid and fetal movement were reviewed in detail with the patient. Please refer to After Visit Summary for other counseling recommendations.   No follow-ups on file.  Future Appointments  Date Time Provider [place]  06/24/2020  9:15 AM [facility] NURSE [facility]  06/24/2020  9:30 AM [facility] US3 [facility]  07/08/2020 10:15 AM Adelbert Gaspard, Fredderick Phenix, MD [facility]    Silas [place], MD

## 2020-06-24 ENCOUNTER — Encounter: Payer: Self-pay | Admitting: *Deleted

## 2020-06-24 ENCOUNTER — Other Ambulatory Visit: Payer: Self-pay | Admitting: *Deleted

## 2020-06-24 ENCOUNTER — Ambulatory Visit: Payer: Medicaid Other | Attending: Obstetrics and Gynecology

## 2020-06-24 ENCOUNTER — Other Ambulatory Visit: Payer: Self-pay

## 2020-06-24 ENCOUNTER — Ambulatory Visit: Payer: Medicaid Other | Admitting: *Deleted

## 2020-06-24 DIAGNOSIS — O34219 Maternal care for unspecified type scar from previous cesarean delivery: Secondary | ICD-10-CM | POA: Diagnosis not present

## 2020-06-24 DIAGNOSIS — O0990 Supervision of high risk pregnancy, unspecified, unspecified trimester: Secondary | ICD-10-CM | POA: Diagnosis not present

## 2020-06-24 DIAGNOSIS — O99412 Diseases of the circulatory system complicating pregnancy, second trimester: Secondary | ICD-10-CM

## 2020-06-24 DIAGNOSIS — Z362 Encounter for other antenatal screening follow-up: Secondary | ICD-10-CM | POA: Diagnosis not present

## 2020-06-24 DIAGNOSIS — I252 Old myocardial infarction: Secondary | ICD-10-CM | POA: Diagnosis not present

## 2020-06-24 DIAGNOSIS — Z3A23 23 weeks gestation of pregnancy: Secondary | ICD-10-CM

## 2020-07-08 ENCOUNTER — Other Ambulatory Visit: Payer: Self-pay

## 2020-07-08 ENCOUNTER — Ambulatory Visit (INDEPENDENT_AMBULATORY_CARE_PROVIDER_SITE_OTHER): Payer: Medicaid Other | Admitting: Obstetrics & Gynecology

## 2020-07-08 VITALS — BP 124/73 | HR 94 | Wt 214.0 lb

## 2020-07-08 DIAGNOSIS — I10 Essential (primary) hypertension: Secondary | ICD-10-CM

## 2020-07-08 DIAGNOSIS — O0990 Supervision of high risk pregnancy, unspecified, unspecified trimester: Secondary | ICD-10-CM

## 2020-07-08 DIAGNOSIS — I252 Old myocardial infarction: Secondary | ICD-10-CM

## 2020-07-08 DIAGNOSIS — Z98891 History of uterine scar from previous surgery: Secondary | ICD-10-CM

## 2020-07-08 NOTE — Progress Notes (Signed)
   PRENATAL VISIT NOTE  Subjective:  Carol Wright is a 35 y.o. [id number] at [redacted]w[redacted]d being seen today for ongoing prenatal care.  She is currently monitored for the following issues for this high-risk pregnancy and has Depression; Generalized anxiety disorder; Hx of myocardial infarction; Hepatic steatosis; Elevated blood pressure reading; PCO (polycystic ovaries); Hypertension; Low vitamin D level; Spontaneous dissection of coronary artery; H/O: cesarean section; Ventricular aneurysm; Morbid obesity ([place]); and Supervision of high risk pregnancy, antepartum on their problem list.  Patient reports rare round ligament pain.  No CP.Marland Kitchen  Contractions: Not present. Vag. Bleeding: None.  Movement: Present. Denies leaking of fluid.   The following portions of the patient's history were reviewed and updated as appropriate: allergies, current medications, past family history, past medical history, past social history, past surgical history and problem list.   Objective:   Vitals:   07/08/20 1025  BP: 124/73  Pulse: 94  Weight: 214 lb (97.1 kg)    Fetal Status: Fetal Heart Rate (bpm): 148   Movement: Present     General:  Alert, oriented and cooperative. Patient is in no acute distress.  Skin: Skin is warm and dry. No rash noted.   Cardiovascular: Normal heart rate noted  Respiratory: Normal respiratory effort, no problems with respiration noted  Abdomen: Soft, gravid, appropriate for gestational age.  Pain/Pressure: Absent     Pelvic: Cervical exam deferred        Extremities: Normal range of motion.  Edema: Trace  Mental Status: Normal mood and affect. Normal behavior. Normal judgment and thought content.   Assessment and Plan:  Pregnancy: E1D4081 at [redacted]w[redacted]d  1.  HTN with CA dissection during first pregnancy--Continue labetalol.  Baby Rx BPs reveiwed and all WNL (goal 130/80 or less)  2.  Serial growth [place]  3.  28 week labs next visit  4.  C/S scheduled at 39 weeks.  Will back up to 38 weeks  if MFM recommends different delivery plan.  Preterm labor symptoms and general obstetric precautions including but not limited to vaginal bleeding, contractions, leaking of fluid and fetal movement were reviewed in detail with the patient. Please refer to After Visit Summary for other counseling recommendations.   No follow-ups on file.  Future Appointments  Date Time Provider [place]  07/29/2020  9:45 AM [facility] NURSE [facility]  07/29/2020 10:00 AM [facility] US1 [facility]    Silas [place], MD

## 2020-07-16 ENCOUNTER — Encounter: Payer: Self-pay | Admitting: *Deleted

## 2020-07-16 ENCOUNTER — Telehealth: Payer: Self-pay | Admitting: *Deleted

## 2020-07-16 ENCOUNTER — Other Ambulatory Visit: Payer: Self-pay | Admitting: Obstetrics and Gynecology

## 2020-07-16 NOTE — Telephone Encounter (Signed)
Call to patient. Advised C-section scheduled for 10-09-20 at 0930, arrive 0730. Requested to move date to Monday, 10-07-20. Advised scheduled c section must be 39 weeks unless MD advises of medical reason for moving up.  Advised will receive letter, EMMI patient education email, hospital brochure in mail. Also advised will receive pre-op call and scheduling Covid testing.  Encounter closed.

## 2020-07-22 ENCOUNTER — Ambulatory Visit (INDEPENDENT_AMBULATORY_CARE_PROVIDER_SITE_OTHER): Payer: Medicaid Other | Admitting: Obstetrics & Gynecology

## 2020-07-22 ENCOUNTER — Other Ambulatory Visit: Payer: Self-pay

## 2020-07-22 VITALS — BP 121/74 | HR 84 | Wt 219.0 lb

## 2020-07-22 DIAGNOSIS — I10 Essential (primary) hypertension: Secondary | ICD-10-CM

## 2020-07-22 DIAGNOSIS — O0992 Supervision of high risk pregnancy, unspecified, second trimester: Secondary | ICD-10-CM

## 2020-07-22 DIAGNOSIS — Z3A27 27 weeks gestation of pregnancy: Secondary | ICD-10-CM

## 2020-07-22 DIAGNOSIS — I2542 Coronary artery dissection: Secondary | ICD-10-CM

## 2020-07-22 DIAGNOSIS — O0990 Supervision of high risk pregnancy, unspecified, unspecified trimester: Secondary | ICD-10-CM

## 2020-07-22 NOTE — Progress Notes (Signed)
   PRENATAL VISIT NOTE  Subjective:  Carol Wright is a 35 y.o. W2B7628 at [redacted]w[redacted]d being seen today for ongoing prenatal care.  She is currently monitored for the following issues for this high-risk pregnancy and has Depression; Generalized anxiety disorder; Hx of myocardial infarction; Hepatic steatosis; Elevated blood pressure reading; PCO (polycystic ovaries); Hypertension; Low vitamin D level; Spontaneous dissection of coronary artery; H/O: cesarean section; Ventricular aneurysm; Morbid obesity ([place]); and Supervision of high risk pregnancy, antepartum on their problem list.  Patient reports no complaints.  Contractions: Not present. Vag. Bleeding: None.  Movement: Present. Denies leaking of fluid.   The following portions of the patient's history were reviewed and updated as appropriate: allergies, current medications, past family history, past medical history, past social history, past surgical history and problem list.   Objective:   Vitals:   07/22/20 0832  BP: 121/74  Pulse: 84  Weight: 219 lb (99.3 kg)    Fetal Status: Fetal Heart Rate (bpm): 144   Movement: Present     General:  Alert, oriented and cooperative. Patient is in no acute distress.  Skin: Skin is warm and dry. No rash noted.   Cardiovascular: Normal heart rate noted  Respiratory: Normal respiratory effort, no problems with respiration noted  Abdomen: Soft, gravid, appropriate for gestational age.  Pain/Pressure: Absent     Pelvic: Cervical exam deferred        Extremities: Normal range of motion.  Edema: Trace  Mental Status: Normal mood and affect. Normal behavior. Normal judgment and thought content.   Assessment and Plan:  Pregnancy: B1D1761 at [redacted]w[redacted]d 1. Supervision of high risk pregnancy, antepartum - 2Hr GTT w/ 1 Hr Carpenter 75 g - HIV antibody (with reflex) - CBC - RPR  2. [redacted] weeks gestation of pregnancy 28 week labs underway today  3. Spontaneous dissection of coronary artery Nml BPs on  meds Planned for rpt c/s at 35 weeks 5 days.  4. Primary hypertension Sees cards next month.  On Cards OB list.    Preterm labor symptoms and general obstetric precautions including but not limited to vaginal bleeding, contractions, leaking of fluid and fetal movement were reviewed in detail with the patient. Please refer to After Visit Summary for other counseling recommendations.   No follow-ups on file.  Future Appointments  Date Time Provider [place]  07/29/2020  9:45 AM [facility] NURSE [facility]  07/29/2020 10:00 AM [facility] US1 [facility]    Silas [place], MD

## 2020-07-23 LAB — 2HR GTT W 1 HR, CARPENTER, 75 G
Glucose, 1 Hr, Gest: 154 mg/dL (ref 65–179)
Glucose, 2 Hr, Gest: 132 mg/dL (ref 65–152)
Glucose, Fasting, Gest: 82 mg/dL (ref 65–91)

## 2020-07-23 LAB — CBC
HCT: 33.2 % — ABNORMAL LOW (ref 35.0–45.0)
Hemoglobin: 11.4 g/dL — ABNORMAL LOW (ref 11.7–15.5)
MCH: 30.6 pg (ref 27.0–33.0)
MCHC: 34.3 g/dL (ref 32.0–36.0)
MCV: 89.2 fL (ref 80.0–100.0)
MPV: 11.3 fL (ref 7.5–12.5)
Platelets: 193 10*3/uL (ref 140–400)
RBC: 3.72 10*6/uL — ABNORMAL LOW (ref 3.80–5.10)
RDW: 13.5 % (ref 11.0–15.0)
WBC: 11.9 10*3/uL — ABNORMAL HIGH (ref 3.8–10.8)

## 2020-07-23 LAB — RPR: RPR Ser Ql: NONREACTIVE

## 2020-07-23 LAB — HIV ANTIBODY (ROUTINE TESTING W REFLEX): HIV 1&2 Ab, 4th Generation: NONREACTIVE

## 2020-07-29 ENCOUNTER — Encounter: Payer: Self-pay | Admitting: *Deleted

## 2020-07-29 ENCOUNTER — Other Ambulatory Visit: Payer: Self-pay

## 2020-07-29 ENCOUNTER — Ambulatory Visit: Payer: Medicaid Other | Admitting: *Deleted

## 2020-07-29 ENCOUNTER — Ambulatory Visit: Payer: Medicaid Other | Attending: Obstetrics

## 2020-07-29 DIAGNOSIS — I252 Old myocardial infarction: Secondary | ICD-10-CM | POA: Insufficient documentation

## 2020-07-29 DIAGNOSIS — O34219 Maternal care for unspecified type scar from previous cesarean delivery: Secondary | ICD-10-CM | POA: Diagnosis not present

## 2020-07-29 DIAGNOSIS — O99413 Diseases of the circulatory system complicating pregnancy, third trimester: Secondary | ICD-10-CM

## 2020-07-29 DIAGNOSIS — Z3A28 28 weeks gestation of pregnancy: Secondary | ICD-10-CM

## 2020-07-29 DIAGNOSIS — O0990 Supervision of high risk pregnancy, unspecified, unspecified trimester: Secondary | ICD-10-CM

## 2020-07-30 ENCOUNTER — Other Ambulatory Visit: Payer: Self-pay | Admitting: *Deleted

## 2020-07-30 DIAGNOSIS — O09523 Supervision of elderly multigravida, third trimester: Secondary | ICD-10-CM

## 2020-08-05 ENCOUNTER — Telehealth (INDEPENDENT_AMBULATORY_CARE_PROVIDER_SITE_OTHER): Payer: Medicaid Other | Admitting: Obstetrics & Gynecology

## 2020-08-05 ENCOUNTER — Encounter: Payer: Self-pay | Admitting: Obstetrics & Gynecology

## 2020-08-05 VITALS — BP 131/79

## 2020-08-05 DIAGNOSIS — O0990 Supervision of high risk pregnancy, unspecified, unspecified trimester: Secondary | ICD-10-CM

## 2020-08-05 DIAGNOSIS — I2542 Coronary artery dissection: Secondary | ICD-10-CM

## 2020-08-05 DIAGNOSIS — O0993 Supervision of high risk pregnancy, unspecified, third trimester: Secondary | ICD-10-CM

## 2020-08-05 DIAGNOSIS — Z3A29 29 weeks gestation of pregnancy: Secondary | ICD-10-CM

## 2020-08-05 DIAGNOSIS — O99413 Diseases of the circulatory system complicating pregnancy, third trimester: Secondary | ICD-10-CM

## 2020-08-05 NOTE — Progress Notes (Signed)
I connected with Carol Wright 08/05/20 at 11:00 AM EDT by: MyChart video and verified that I am speaking with the correct person using two identifiers.  Patient is located at home and provider is located at [place].     The purpose of this virtual visit is to provide medical care while limiting exposure to the novel coronavirus. I discussed the limitations, risks, security and privacy concerns of performing an evaluation and management service by MyChart video and the availability of in person appointments. I also discussed with the patient that there may be a patient responsible charge related to this service. By engaging in this virtual visit, you consent to the provision of healthcare.  Additionally, you authorize for your insurance to be billed for the services provided during this visit.  The patient expressed understanding and agreed to proceed.  The following staff members participated in the virtual visit:  Sherryle Lis, RN    PRENATAL VISIT NOTE  Subjective:  Carol Wright is a 35 y.o. [id number] at [redacted]w[redacted]d  for phone visit for ongoing prenatal care.  She is currently monitored for the following issues for this high-risk pregnancy and has Depression; Generalized anxiety disorder; Hx of myocardial infarction; Hepatic steatosis; Elevated blood pressure reading; PCO (polycystic ovaries); Hypertension; Low vitamin D level; Spontaneous dissection of coronary artery; H/O: cesarean section; Ventricular aneurysm; Morbid obesity ([place]); and Supervision of high risk pregnancy, antepartum on their problem list.  Patient reports no complaints.   .  .   . Denies leaking of fluid.   The following portions of the patient's history were reviewed and updated as appropriate: allergies, current medications, past family history, past medical history, past social history, past surgical history and problem list.   Objective:   Vitals:   08/05/20 1057  BP: 131/79   Self-Obtained  Fetal Status:            Assessment and Plan:   Pregnancy: [id number] at [redacted]w[redacted]d 1. [redacted] weeks gestation of pregnancy   2. Supervision of high risk pregnancy, antepartum Blood pressures are following the Babyscripts well.  120s to 130s over 70s.  Patient to continue labetalol 100 mg twice a day.  3. Spontaneous dissection of coronary artery Patient wishes to have C-section at 37 weeks and 5 days.  Will discuss with MFM about delivery plan.  Preterm labor symptoms and general obstetric precautions including but not limited to vaginal bleeding, contractions, leaking of fluid and fetal movement were reviewed in detail with the patient.  No follow-ups on file.  Future Appointments  Date Time Provider [place]  08/26/2020  9:15 AM [facility] NURSE [facility]  08/26/2020  9:30 AM [facility] US3 [facility]  09/05/2020 10:15 AM [facility] NST [facility]  09/12/2020 10:15 AM [facility] NST [facility] [place]    Time spent on virtual visit: 8 minutes  Silas Sacramento, MD

## 2020-08-05 NOTE — Progress Notes (Signed)
Video visit 08/05/20 as below

## 2020-08-06 ENCOUNTER — Encounter: Payer: Self-pay | Admitting: *Deleted

## 2020-08-06 ENCOUNTER — Telehealth: Payer: Self-pay | Admitting: *Deleted

## 2020-08-06 NOTE — Telephone Encounter (Signed)
Call to patient. Advised cesarean section has been rescheduled as requested. Now on 10-07-20 at 1230, arrive 1030.  Will receive updated letter in mail and visible on My Chart.  Encounter closed.

## 2020-08-13 ENCOUNTER — Telehealth: Payer: Self-pay | Admitting: *Deleted

## 2020-08-13 ENCOUNTER — Other Ambulatory Visit: Payer: Self-pay

## 2020-08-13 ENCOUNTER — Encounter (HOSPITAL_COMMUNITY): Payer: Self-pay | Admitting: Obstetrics and Gynecology

## 2020-08-13 ENCOUNTER — Inpatient Hospital Stay (HOSPITAL_COMMUNITY)
Admission: AD | Admit: 2020-08-13 | Discharge: 2020-08-13 | Disposition: A | Discharge status: Home / Self Care | Payer: Medicaid Other | Attending: Obstetrics and Gynecology | Admitting: Obstetrics and Gynecology

## 2020-08-13 DIAGNOSIS — Z3A30 30 weeks gestation of pregnancy: Secondary | ICD-10-CM | POA: Insufficient documentation

## 2020-08-13 DIAGNOSIS — O98813 Other maternal infectious and parasitic diseases complicating pregnancy, third trimester: Secondary | ICD-10-CM | POA: Insufficient documentation

## 2020-08-13 DIAGNOSIS — T148XXA Other injury of unspecified body region, initial encounter: Secondary | ICD-10-CM

## 2020-08-13 DIAGNOSIS — X58XXXA Exposure to other specified factors, initial encounter: Secondary | ICD-10-CM | POA: Insufficient documentation

## 2020-08-13 DIAGNOSIS — O99213 Obesity complicating pregnancy, third trimester: Secondary | ICD-10-CM | POA: Diagnosis not present

## 2020-08-13 DIAGNOSIS — I252 Old myocardial infarction: Secondary | ICD-10-CM | POA: Diagnosis not present

## 2020-08-13 DIAGNOSIS — B373 Candidiasis of vulva and vagina: Secondary | ICD-10-CM | POA: Diagnosis not present

## 2020-08-13 DIAGNOSIS — S3141XA Laceration without foreign body of vagina and vulva, initial encounter: Secondary | ICD-10-CM | POA: Insufficient documentation

## 2020-08-13 DIAGNOSIS — Z7982 Long term (current) use of aspirin: Secondary | ICD-10-CM | POA: Insufficient documentation

## 2020-08-13 DIAGNOSIS — O9A213 Injury, poisoning and certain other consequences of external causes complicating pregnancy, third trimester: Secondary | ICD-10-CM | POA: Diagnosis not present

## 2020-08-13 DIAGNOSIS — E785 Hyperlipidemia, unspecified: Secondary | ICD-10-CM | POA: Diagnosis not present

## 2020-08-13 DIAGNOSIS — O99283 Endocrine, nutritional and metabolic diseases complicating pregnancy, third trimester: Secondary | ICD-10-CM | POA: Diagnosis not present

## 2020-08-13 DIAGNOSIS — Z79899 Other long term (current) drug therapy: Secondary | ICD-10-CM | POA: Diagnosis not present

## 2020-08-13 DIAGNOSIS — O0990 Supervision of high risk pregnancy, unspecified, unspecified trimester: Secondary | ICD-10-CM

## 2020-08-13 DIAGNOSIS — E282 Polycystic ovarian syndrome: Secondary | ICD-10-CM | POA: Diagnosis not present

## 2020-08-13 DIAGNOSIS — O23593 Infection of other part of genital tract in pregnancy, third trimester: Secondary | ICD-10-CM

## 2020-08-13 DIAGNOSIS — B3731 Acute candidiasis of vulva and vagina: Secondary | ICD-10-CM

## 2020-08-13 DIAGNOSIS — Z3689 Encounter for other specified antenatal screening: Secondary | ICD-10-CM

## 2020-08-13 DIAGNOSIS — O4693 Antepartum hemorrhage, unspecified, third trimester: Secondary | ICD-10-CM | POA: Diagnosis present

## 2020-08-13 LAB — URINALYSIS, ROUTINE W REFLEX MICROSCOPIC
Bilirubin Urine: NEGATIVE
Glucose, UA: NEGATIVE mg/dL
Ketones, ur: NEGATIVE mg/dL
Nitrite: NEGATIVE
Protein, ur: NEGATIVE mg/dL
Specific Gravity, Urine: 1.012 (ref 1.005–1.030)
pH: 6 (ref 5.0–8.0)

## 2020-08-13 LAB — WET PREP, GENITAL
Clue Cells Wet Prep HPF POC: NONE SEEN
Sperm: NONE SEEN
Trich, Wet Prep: NONE SEEN

## 2020-08-13 MED ORDER — TERCONAZOLE 0.4 % VA CREA: 1.0000 | TOPICAL_CREAM | Freq: Every day | VAGINAL | 0 refills | Status: DC

## 2020-08-13 NOTE — MAU Provider Note (Signed)
History     CSN: [id number]  Arrival date and time: 08/13/20 4580   Event Date/Time   First Provider Initiated Contact with Patient 08/13/20 1011      Chief Complaint  Patient presents with  . Vaginal Bleeding   Carol Wright is a 35 y.o. [id number] at [redacted]w[redacted]d who receives care at [facility].  She presents today for Vaginal Bleeding.  She states this morning she noted blood on her pantyliner around 0800.  She states she also noted a small clot "about the size of half a dime."  She denies cramping or sex within the past 72 hours. She also denies vaginal discharge prior to the bleeding.  She states now she only notes bright red blood with wiping.  Patient endorses fetal movement and denies contractions.    OB History    Gravida  3   Para  2   Term  0   Preterm  2   AB      Living  2     SAB      IAB      Ectopic      Multiple      Live Births              Past Medical History:  Diagnosis Date  . Depression   . Generalized anxiety disorder   . H/O arterial dissection   . Hepatic steatosis    Via ultrasound.    . Hyperlipidemia   . Myocardial infarction ([place]) 2013  . Obesity (BMI 30-39.9)   . PCOS (polycystic ovarian syndrome)   . Pre-eclampsia   . Spontaneous dissection of coronary artery    a. occurred in 2013 during pregnany. Underwent placement of BMS per the patient's report.     Past Surgical History:  Procedure Laterality Date  . Arterial disection  2013  . Bare metal stent  2013  . CESAREAN SECTION  2013    Family History  Problem Relation Age of Onset  . Hypertension Mother   . Hypertension Maternal Grandmother   . Heart attack Maternal Grandfather     Social History   Tobacco Use  . Smoking status: Never Smoker  . Smokeless tobacco: Never Used  Vaping Use  . Vaping Use: Never used  Substance Use Topics  . Alcohol use: No  . Drug use: No    Allergies: No Known Allergies  Medications Prior to Admission  Medication Sig  Dispense Refill Last Dose  . aspirin 81 MG tablet Take 81 mg by mouth daily.   08/12/2020 at Unknown time  . cetirizine (ZYRTEC) 10 MG tablet Take 10 mg by mouth daily. Reported on 10/04/2015   08/12/2020 at Unknown time  . labetalol (NORMODYNE) 100 MG tablet Take 100 mg by mouth 2 (two) times daily.   08/12/2020 at Unknown time  . metFORMIN (GLUCOPHAGE) 500 MG tablet Take 1 tablet (500 mg total) by mouth 2 (two) times daily with a meal. 180 tablet 2 08/12/2020 at Unknown time  . Prenatal Vit-Fe Fumarate-FA (MULTIVITAMIN-PRENATAL) 27-0.8 MG TABS tablet Take 1 tablet by mouth daily.   08/12/2020 at Unknown time    Review of Systems  Gastrointestinal: Negative for abdominal pain, nausea and vomiting.  Genitourinary: Positive for vaginal bleeding (With wiping). Negative for difficulty urinating and dysuria.  Musculoskeletal: Positive for back pain ("A little sore").   Physical Exam   Blood pressure 131/81, pulse 92, temperature 98.8 F (37.1 C), temperature source Oral, resp. rate 17, last menstrual period 01/10/2020, SpO2  98 %.  Physical Exam Vitals reviewed. Exam conducted with a chaperone present.  Constitutional:      Appearance: Normal appearance.  HENT:     Head: Normocephalic and atraumatic.  Eyes:     Conjunctiva/sclera: Conjunctivae normal.  Cardiovascular:     Rate and Rhythm: Normal rate and regular rhythm.     Heart sounds: Normal heart sounds.  Pulmonary:     Effort: Pulmonary effort is normal.  Abdominal:     General: Bowel sounds are normal.     Palpations: Abdomen is soft.  Genitourinary:    Comments:  Speculum Exam: -Normal External Genitalia: Non tender, no apparent discharge at introitus. Small labial abrasion noted parallel to clitoris on right side.  Scant bleeding-hemostatic with pressure. -Vaginal Vault: Pink mucosa with good rugae. Copious amt thick white curdy discharge -wet prep collected -Cervix:Pink, no lesions, cysts, or polyps.  Appears closed. No active  bleeding from os-GC/CT collected -Bimanual Exam:  Closed/Thick  Musculoskeletal:        General: Normal range of motion.     Cervical back: Normal range of motion.  Skin:    General: Skin is warm and dry.  Neurological:     Mental Status: She is alert.  Psychiatric:        Mood and Affect: Mood normal.        Behavior: Behavior normal.        Thought Content: Thought content normal.     Fetal Assessment 140 bpm, Mod Var, -Decels, +15x15 Accels Toco: None graphed  MAU Course   Results for orders placed or performed during the hospital encounter of 08/13/20 (from the past 24 hour(s))  Urinalysis, Routine w reflex microscopic Urine, Clean Catch     Status: Abnormal   Collection Time: 08/13/20  9:35 AM  Result Value Ref Range   Color, Urine YELLOW YELLOW   APPearance CLEAR CLEAR   Specific Gravity, Urine 1.012 1.005 - 1.030   pH 6.0 5.0 - 8.0   Glucose, UA NEGATIVE NEGATIVE mg/dL   Hgb urine dipstick SMALL (A) NEGATIVE   Bilirubin Urine NEGATIVE NEGATIVE   Ketones, ur NEGATIVE NEGATIVE mg/dL   Protein, ur NEGATIVE NEGATIVE mg/dL   Nitrite NEGATIVE NEGATIVE   Leukocytes,Ua MODERATE (A) NEGATIVE   RBC / HPF 0-5 0 - 5 RBC/hpf   WBC, UA 0-5 0 - 5 WBC/hpf   Bacteria, UA RARE (A) NONE SEEN   Squamous Epithelial / LPF 0-5 0 - 5   Mucus PRESENT   Wet prep, genital     Status: Abnormal   Collection Time: 08/13/20 10:06 AM   Specimen: Cervix  Result Value Ref Range   Yeast Wet Prep HPF POC PRESENT (A) NONE SEEN   Trich, Wet Prep NONE SEEN NONE SEEN   Clue Cells Wet Prep HPF POC NONE SEEN NONE SEEN   WBC, Wet Prep HPF POC MANY (A) NONE SEEN   Sperm NONE SEEN    No results found.  MDM PE Labs: Wet prep, GC/CT, UA EFM Assessment and Plan  35 year old G3P0202  SIUP at 30.6weeks Cat I FT Labial Laceration-Superficial Vaginal Yeast   -POC Reviewed -Exam performed and findings discussed. -Informed that superficial laceration is noted near clitoral area which is likely  cause for bleeding.  -Speculum exam significant for yeast and will treat -Cultures collected and pending.  -Discussed care of labial laceration. -Informed that if wet prep returns with other findings will send mychart message. -Patient expresses relief for findings and lack  of vaginal bleeding. -Rx for Terazol sent to pharmacy on file.  -Encouraged to call or return to MAU if symptoms worsen or with the onset of new symptoms. -Discharged to home in stable condition.  Maryann Conners MSN, CNM 08/13/2020, 10:11 AM

## 2020-08-13 NOTE — Telephone Encounter (Signed)
Patient called with bleeding and clotting. Patient advised by Zacarias Pontes, RN to go to MAU for evaluation. Patient agreed.

## 2020-08-13 NOTE — Discharge Instructions (Signed)
Vaginal Yeast Infection, Adult  Vaginal yeast infection is a condition that causes vaginal discharge as well as soreness, swelling, and redness (inflammation) of the vagina. This is a common condition. Some women get this infection frequently. What are the causes? This condition is caused by a change in the normal balance of the yeast (candida) and bacteria that live in the vagina. This change causes an overgrowth of yeast, which causes the inflammation. What increases the risk? The condition is more likely to develop in women who:  Take antibiotic medicines.  Have diabetes.  Take birth control pills.  Are pregnant.  Douche often.  Have a weak body defense system (immune system).  Have been taking steroid medicines for a long time.  Frequently wear tight clothing. What are the signs or symptoms? Symptoms of this condition include:  White, thick, creamy vaginal discharge.  Swelling, itching, redness, and irritation of the vagina. The lips of the vagina (vulva) may be affected as well.  Pain or a burning feeling while urinating.  Pain during sex. How is this diagnosed? This condition is diagnosed based on:  Your medical history.  A physical exam.  A pelvic exam. Your health care provider will examine a sample of your vaginal discharge under a microscope. Your health care provider may send this sample for testing to confirm the diagnosis. How is this treated? This condition is treated with medicine. Medicines may be over-the-counter or prescription. You may be told to use one or more of the following:  Medicine that is taken by mouth (orally).  Medicine that is applied as a cream (topically).  Medicine that is inserted directly into the vagina (suppository). Follow these instructions at home: Lifestyle  Do not have sex until your health care provider approves. Tell your sex partner that you have a yeast infection. That person should go to his or her health care  provider and ask if they should also be treated.  Do not wear tight clothes, such as pantyhose or tight pants.  Wear breathable cotton underwear. General instructions  Take or apply over-the-counter and prescription medicines only as told by your health care provider.  Eat more yogurt. This may help to keep your yeast infection from returning.  Do not use tampons until your health care provider approves.  Try taking a sitz bath to help with discomfort. This is a warm water bath that is taken while you are sitting down. The water should only come up to your hips and should cover your buttocks. Do this 3-4 times per day or as told by your health care provider.  Do not douche.  If you have diabetes, keep your blood sugar levels under control.  Keep all follow-up visits as told by your health care provider. This is important.   Contact a health care provider if:  You have a fever.  Your symptoms go away and then return.  Your symptoms do not get better with treatment.  Your symptoms get worse.  You have new symptoms.  You develop blisters in or around your vagina.  You have blood coming from your vagina and it is not your menstrual period.  You develop pain in your abdomen. Summary  Vaginal yeast infection is a condition that causes discharge as well as soreness, swelling, and redness (inflammation) of the vagina.  This condition is treated with medicine. Medicines may be over-the-counter or prescription.  Take or apply over-the-counter and prescription medicines only as told by your health care provider.  Do not   douche. Do not have sex or use tampons until your health care provider approves.  Contact a health care provider if your symptoms do not get better with treatment or your symptoms go away and then return. This information is not intended to replace advice given to you by your health care provider. Make sure you discuss any questions you have with your health care  provider. Document Revised: 09/30/2018 Document Reviewed: 07/19/2017 Elsevier Patient Education  2021 Elsevier Inc.  

## 2020-08-13 NOTE — MAU Note (Addendum)
...  Carol Wright is a 35 y.o. at [redacted]w[redacted]d here in MAU reporting: She noticed a smear of bright red blood on her pantyliner when she used the restroom at 0800. She also states there was one blood clot smaller than a dime. States she did a lot of yard work yesterday. Denies LOF. +FM.   Patient used the restroom in MAU and had no blood on her pad but noted blood when she wiped. No more blood clots.  Lab orders placed from triage: UA

## 2020-08-14 LAB — GC/CHLAMYDIA PROBE AMP (~~LOC~~) NOT AT ARMC
Chlamydia: NEGATIVE
Comment: NEGATIVE
Comment: NORMAL
Neisseria Gonorrhea: NEGATIVE

## 2020-08-19 ENCOUNTER — Other Ambulatory Visit: Payer: Self-pay

## 2020-08-19 ENCOUNTER — Ambulatory Visit (INDEPENDENT_AMBULATORY_CARE_PROVIDER_SITE_OTHER): Payer: Medicaid Other | Admitting: Obstetrics & Gynecology

## 2020-08-19 VITALS — BP 121/75 | HR 95 | Wt 225.0 lb

## 2020-08-19 DIAGNOSIS — I10 Essential (primary) hypertension: Secondary | ICD-10-CM

## 2020-08-19 DIAGNOSIS — I252 Old myocardial infarction: Secondary | ICD-10-CM

## 2020-08-19 DIAGNOSIS — Z3A31 31 weeks gestation of pregnancy: Secondary | ICD-10-CM

## 2020-08-19 DIAGNOSIS — Z98891 History of uterine scar from previous surgery: Secondary | ICD-10-CM

## 2020-08-19 NOTE — Progress Notes (Signed)
   PRENATAL VISIT NOTE  Subjective:  Carol Wright is a 35 y.o. [id number] at [redacted]w[redacted]d being seen today for ongoing prenatal care.  She is currently monitored for the following issues for this high-risk pregnancy and has Depression; Generalized anxiety disorder; Hx of myocardial infarction; Hepatic steatosis; Elevated blood pressure reading; PCO (polycystic ovaries); Hypertension; Low vitamin D level; Spontaneous dissection of coronary artery; H/O: cesarean section; Ventricular aneurysm; Morbid obesity ([place]); and Supervision of high risk pregnancy, antepartum on their problem list.  Patient reports no complaints.   .  .   . Denies leaking of fluid.   The following portions of the patient's history were reviewed and updated as appropriate: allergies, current medications, past family history, past medical history, past social history, past surgical history and problem list.   Objective:  There were no vitals filed for this visit.  Fetal Status:           General:  Alert, oriented and cooperative. Patient is in no acute distress.  Skin: Skin is warm and dry. No rash noted.   Cardiovascular: Normal heart rate noted  Respiratory: Normal respiratory effort, no problems with respiration noted  Abdomen: Soft, gravid, appropriate for gestational age.        Pelvic: Cervical exam deferred        Extremities: Normal range of motion.     Mental Status: Normal mood and affect. Normal behavior. Normal judgment and thought content.   Assessment and Plan:  Pregnancy: [id number] at [redacted]w[redacted]d 1. [redacted] weeks gestation of pregnancy  2.  CHTN Antenatal testing at 32 weeks with serial growth [place] q4 weeks   3.  Prior c/s Scheduled between 38-39 weeks  Preterm labor symptoms and general obstetric precautions including but not limited to vaginal bleeding, contractions, leaking of fluid and fetal movement were reviewed in detail with the patient. Please refer to After Visit Summary for other counseling recommendations.    Video visit in 2 weeks.  Future Appointments  Date Time Provider [place]  08/19/2020  2:00 PM Guss Bunde, MD [facility]  08/26/2020  9:15 AM [facility] NURSE [facility]  08/26/2020  9:30 AM [facility] US3 [facility]  09/05/2020 10:15 AM [facility] NST [facility]  09/12/2020 10:15 AM [facility] NST [facility]    Silas Sacramento, MD

## 2020-08-26 ENCOUNTER — Ambulatory Visit: Payer: Medicaid Other | Admitting: *Deleted

## 2020-08-26 ENCOUNTER — Other Ambulatory Visit: Payer: Self-pay | Admitting: *Deleted

## 2020-08-26 ENCOUNTER — Ambulatory Visit: Payer: Medicaid Other | Attending: Obstetrics and Gynecology

## 2020-08-26 ENCOUNTER — Encounter: Payer: Self-pay | Admitting: *Deleted

## 2020-08-26 ENCOUNTER — Other Ambulatory Visit: Payer: Self-pay

## 2020-08-26 VITALS — BP 111/64 | HR 97

## 2020-08-26 DIAGNOSIS — O0990 Supervision of high risk pregnancy, unspecified, unspecified trimester: Secondary | ICD-10-CM

## 2020-08-26 DIAGNOSIS — O99413 Diseases of the circulatory system complicating pregnancy, third trimester: Secondary | ICD-10-CM

## 2020-08-26 DIAGNOSIS — O09523 Supervision of elderly multigravida, third trimester: Secondary | ICD-10-CM

## 2020-08-26 DIAGNOSIS — Z3A32 32 weeks gestation of pregnancy: Secondary | ICD-10-CM

## 2020-08-26 DIAGNOSIS — O0993 Supervision of high risk pregnancy, unspecified, third trimester: Secondary | ICD-10-CM

## 2020-08-26 DIAGNOSIS — I729 Aneurysm of unspecified site: Secondary | ICD-10-CM | POA: Diagnosis not present

## 2020-08-26 DIAGNOSIS — O34219 Maternal care for unspecified type scar from previous cesarean delivery: Secondary | ICD-10-CM | POA: Diagnosis not present

## 2020-08-26 DIAGNOSIS — O321XX0 Maternal care for breech presentation, not applicable or unspecified: Secondary | ICD-10-CM

## 2020-09-02 ENCOUNTER — Other Ambulatory Visit: Payer: Self-pay

## 2020-09-02 ENCOUNTER — Ambulatory Visit (INDEPENDENT_AMBULATORY_CARE_PROVIDER_SITE_OTHER): Payer: Medicaid Other | Admitting: Obstetrics & Gynecology

## 2020-09-02 VITALS — BP 119/72 | HR 91 | Wt 233.0 lb

## 2020-09-02 DIAGNOSIS — Z3A33 33 weeks gestation of pregnancy: Secondary | ICD-10-CM

## 2020-09-02 DIAGNOSIS — Z23 Encounter for immunization: Secondary | ICD-10-CM | POA: Diagnosis not present

## 2020-09-02 DIAGNOSIS — I10 Essential (primary) hypertension: Secondary | ICD-10-CM | POA: Diagnosis not present

## 2020-09-02 DIAGNOSIS — O0990 Supervision of high risk pregnancy, unspecified, unspecified trimester: Secondary | ICD-10-CM

## 2020-09-02 DIAGNOSIS — I2510 Atherosclerotic heart disease of native coronary artery without angina pectoris: Secondary | ICD-10-CM | POA: Diagnosis not present

## 2020-09-02 NOTE — Progress Notes (Signed)
Patient ID: Carol Wright, female   DOB: [birth year], 35 y.o.   MRN: [id number]    PRENATAL VISIT NOTE  Subjective:  Carol Wright is a 35 y.o. [id number] at [redacted]w[redacted]d being seen today for ongoing prenatal care.  She is currently monitored for the following issues for this high-risk pregnancy and has Depression; Generalized anxiety disorder; Hx of myocardial infarction; Hepatic steatosis; Elevated blood pressure reading; PCO (polycystic ovaries); Hypertension; Low vitamin D level; Spontaneous dissection of coronary artery; H/O: cesarean section; Ventricular aneurysm; Morbid obesity ([place]); and Supervision of high risk pregnancy, antepartum on their problem list.  Patient reports no complaints.  Contractions: Not present. Vag. Bleeding: None.  Movement: Present. Denies leaking of fluid.   The following portions of the patient's history were reviewed and updated as appropriate: allergies, current medications, past family history, past medical history, past social history, past surgical history and problem list.   Objective:   Vitals:   09/02/20 1349  BP: 119/72  Pulse: 91  Weight: 233 lb (105.7 kg)    Fetal Status: Fetal Heart Rate (bpm): 141   Movement: Present     General:  Alert, oriented and cooperative. Patient is in no acute distress.  Skin: Skin is warm and dry. No rash noted.   Cardiovascular: Normal heart rate noted  Respiratory: Normal respiratory effort, no problems with respiration noted  Abdomen: Soft, gravid, appropriate for gestational age.  Pain/Pressure: Absent     Pelvic: Cervical exam deferred        Extremities: Normal range of motion.  Edema: Trace  Mental Status: Normal mood and affect. Normal behavior. Normal judgment and thought content.   Assessment and Plan:  Pregnancy: [id number] at 100w5d 1. [redacted] weeks gestation of pregnancy Weekly antenatal testing C/S scheduled BP well controlled on Labetalol 100 bid  Preterm labor symptoms and general obstetric precautions  including but not limited to vaginal bleeding, contractions, leaking of fluid and fetal movement were reviewed in detail with the patient. Please refer to After Visit Summary for other counseling recommendations.   No follow-ups on file.  Future Appointments  Date Time Provider [place]  09/05/2020 10:15 AM [facility]ST [facility]  09/12/2020 10:15 AM [facility] NST [facility]  09/23/2020  9:15 AM [facility] NURSE [facility]  09/23/2020  9:30 AM [facility] US3 [facility]    Silas Sacramento, MD

## 2020-09-05 ENCOUNTER — Ambulatory Visit (INDEPENDENT_AMBULATORY_CARE_PROVIDER_SITE_OTHER): Payer: Medicaid Other | Admitting: General Practice

## 2020-09-05 ENCOUNTER — Other Ambulatory Visit: Payer: Self-pay

## 2020-09-05 ENCOUNTER — Ambulatory Visit (INDEPENDENT_AMBULATORY_CARE_PROVIDER_SITE_OTHER): Payer: Medicaid Other

## 2020-09-05 VITALS — BP 120/67 | HR 97

## 2020-09-05 DIAGNOSIS — O163 Unspecified maternal hypertension, third trimester: Secondary | ICD-10-CM

## 2020-09-05 DIAGNOSIS — O0990 Supervision of high risk pregnancy, unspecified, unspecified trimester: Secondary | ICD-10-CM

## 2020-09-05 DIAGNOSIS — Z3A34 34 weeks gestation of pregnancy: Secondary | ICD-10-CM | POA: Diagnosis not present

## 2020-09-05 NOTE — Progress Notes (Signed)
Pt informed that the ultrasound is considered a limited OB ultrasound and is not intended to be a complete ultrasound exam.  Patient also informed that the ultrasound is not being completed with the intent of assessing for fetal or placental anomalies or any pelvic abnormalities.  Explained that the purpose of today's ultrasound is to assess for  BPP, presentation, and AFI.  Patient acknowledges the purpose of the exam and the limitations of the study.     Koren Bound RN BSN 09/05/20

## 2020-09-12 ENCOUNTER — Ambulatory Visit: Payer: Medicaid Other | Admitting: *Deleted

## 2020-09-12 ENCOUNTER — Other Ambulatory Visit: Payer: Self-pay

## 2020-09-12 ENCOUNTER — Ambulatory Visit (INDEPENDENT_AMBULATORY_CARE_PROVIDER_SITE_OTHER): Payer: Medicaid Other

## 2020-09-12 VITALS — BP 128/76 | HR 96 | Wt 231.7 lb

## 2020-09-12 DIAGNOSIS — O163 Unspecified maternal hypertension, third trimester: Secondary | ICD-10-CM | POA: Diagnosis not present

## 2020-09-12 NOTE — Progress Notes (Signed)

## 2020-09-18 ENCOUNTER — Telehealth: Payer: Self-pay | Admitting: Obstetrics & Gynecology

## 2020-09-18 ENCOUNTER — Inpatient Hospital Stay (HOSPITAL_COMMUNITY)
Admission: AD | Admit: 2020-09-18 | Discharge: 2020-09-19 | Disposition: A | Discharge status: Home / Self Care | Payer: Medicaid Other | Attending: Obstetrics & Gynecology | Admitting: Obstetrics & Gynecology

## 2020-09-18 ENCOUNTER — Other Ambulatory Visit: Payer: Self-pay

## 2020-09-18 DIAGNOSIS — R110 Nausea: Secondary | ICD-10-CM | POA: Insufficient documentation

## 2020-09-18 DIAGNOSIS — O26893 Other specified pregnancy related conditions, third trimester: Secondary | ICD-10-CM | POA: Insufficient documentation

## 2020-09-18 DIAGNOSIS — O10913 Unspecified pre-existing hypertension complicating pregnancy, third trimester: Secondary | ICD-10-CM

## 2020-09-18 DIAGNOSIS — Z8249 Family history of ischemic heart disease and other diseases of the circulatory system: Secondary | ICD-10-CM | POA: Diagnosis not present

## 2020-09-18 DIAGNOSIS — Z7982 Long term (current) use of aspirin: Secondary | ICD-10-CM | POA: Diagnosis not present

## 2020-09-18 DIAGNOSIS — Z79899 Other long term (current) drug therapy: Secondary | ICD-10-CM | POA: Insufficient documentation

## 2020-09-18 DIAGNOSIS — O0990 Supervision of high risk pregnancy, unspecified, unspecified trimester: Secondary | ICD-10-CM

## 2020-09-18 DIAGNOSIS — R0989 Other specified symptoms and signs involving the circulatory and respiratory systems: Secondary | ICD-10-CM | POA: Diagnosis not present

## 2020-09-18 DIAGNOSIS — Z3A36 36 weeks gestation of pregnancy: Secondary | ICD-10-CM

## 2020-09-18 DIAGNOSIS — Z7984 Long term (current) use of oral hypoglycemic drugs: Secondary | ICD-10-CM | POA: Diagnosis not present

## 2020-09-18 DIAGNOSIS — R030 Elevated blood-pressure reading, without diagnosis of hypertension: Secondary | ICD-10-CM | POA: Insufficient documentation

## 2020-09-18 NOTE — MAU Note (Signed)
Last few days my b/ps have been slightly higher. Tonight I went to [place] and felt alittle nauseated and got hot. I took my b/p and it 158/100. Denies h/a or visual changes. Denies VB or LOF. Has appt today at [facility]

## 2020-09-18 NOTE — MAU Provider Note (Signed)
Chief Complaint:  Hypertension   Event Date/Time   First Provider Initiated Contact with Patient 09/18/20 2355     HPI: Alencia Gordon is a 35 y.o. [id number] at 24w0dwho presents to maternity admissions reporting elevated BP at home.   Has chronic hypertension, controlled on Labetalol. She reports good fetal movement, denies LOF, vaginal bleeding, vaginal itching/burning, urinary symptoms, h/a, dizziness, n/v, diarrhea, constipation or fever/chills.  She denies headache, visual changes or RUQ abdominal pain.  Hypertension This is a recurrent problem. The current episode started today. Pertinent negatives include no anxiety, blurred vision, chest pain, headaches, malaise/fatigue, peripheral edema or shortness of breath. There are no associated agents to hypertension. Past treatments include nothing. There are no compliance problems.    RN Note: Last few days my b/ps have been slightly higher. Tonight I went to BR and felt alittle nauseated and got hot. I took my b/p and it 158/100. Denies h/a or visual changes. Denies VB or LOF. Has appt today at [facility]  Past Medical History: Past Medical History:  Diagnosis Date   Depression    Generalized anxiety disorder    H/O arterial dissection    Hepatic steatosis    Via ultrasound.     Hyperlipidemia    Myocardial infarction ([place]) 2013   Obesity (BMI 30-39.9)    PCOS (polycystic ovarian syndrome)    Pre-eclampsia    Spontaneous dissection of coronary artery    a. occurred in 2013 during pregnany. Underwent placement of BMS per the patient's report.     Past obstetric history: OB History  Gravida Para Term Preterm AB Living  3 2 0 2   2  SAB IAB Ectopic Multiple Live Births               # Outcome Date GA Lbr Len/2nd Weight Sex Delivery Anes PTL Lv  3 Current           2 Preterm      CS-LTranv     1 Preterm      CS-LTranv       Past Surgical History: Past Surgical History:  Procedure Laterality Date   Arterial disection  2013    Bare metal stent  2013   CESAREAN SECTION  2013    Family History: Family History  Problem Relation Age of Onset   Hypertension Mother    Hypertension Maternal Grandmother    Heart attack Maternal Grandfather     Social History: Social History   Tobacco Use   Smoking status: Never   Smokeless tobacco: Never  Vaping Use   Vaping Use: Never used  Substance Use Topics   Alcohol use: No   Drug use: No    Allergies: No Known Allergies  Meds:  Medications Prior to Admission  Medication Sig Dispense Refill Last Dose   aspirin 81 MG tablet Take 81 mg by mouth daily.      cetirizine (ZYRTEC) 10 MG tablet Take 10 mg by mouth daily. Reported on 10/04/2015      labetalol (NORMODYNE) 100 MG tablet Take 100 mg by mouth 2 (two) times daily.      metFORMIN (GLUCOPHAGE) 500 MG tablet Take 1 tablet (500 mg total) by mouth 2 (two) times daily with a meal. 180 tablet 2    Prenatal Vit-Fe Fumarate-FA (MULTIVITAMIN-PRENATAL) 27-0.8 MG TABS tablet Take 1 tablet by mouth daily.       I have reviewed patient's Past Medical Hx, Surgical Hx, Family Hx, Social Hx, medications and allergies.  ROS:  Review of Systems  Constitutional:  Negative for malaise/fatigue.  Eyes:  Negative for blurred vision.  Respiratory:  Negative for shortness of breath.   Cardiovascular:  Negative for chest pain.  Neurological:  Negative for headaches.  Other systems negative  Physical Exam  No data found. Constitutional: Well-developed, well-nourished female in no acute distress.  Cardiovascular: normal rate and rhythm Respiratory: normal effort, clear to auscultation bilaterally GI: Abd soft, non-tender, gravid appropriate for gestational age.   No rebound or guarding. MS: Extremities nontender, Trace edema, normal ROM Neurologic: Alert and oriented x 4. DTRs 2+ with no clonus GU: Neg CVAT.  PELVIC EXAM: deferred FHT:  Baseline 140 , moderate variability, accelerations present, no  decelerations Contractions: Irregular     Labs: Results for orders placed or performed during the hospital encounter of 09/18/20 (from the past 24 hour(s))  Protein / creatinine ratio, urine     Status: None   Collection Time: 09/18/20 11:56 PM  Result Value Ref Range   Creatinine, Urine 55.91 mg/dL   Total Protein, Urine 7 mg/dL   Protein Creatinine Ratio 0.13 0.00 - 0.15 mg/mg[Cre]  CBC     Status: Abnormal   Collection Time: 09/19/20 12:12 AM  Result Value Ref Range   WBC 12.1 (H) 4.0 - 10.5 K/uL   RBC 3.93 3.87 - 5.11 MIL/uL   Hemoglobin 11.8 (L) 12.0 - 15.0 g/dL   HCT 33.9 (L) 36.0 - 46.0 %   MCV 86.3 80.0 - 100.0 fL   MCH 30.0 26.0 - 34.0 pg   MCHC 34.8 30.0 - 36.0 g/dL   RDW 14.7 11.5 - 15.5 %   Platelets 181 150 - 400 K/uL   nRBC 0.0 0.0 - 0.2 %  Comprehensive metabolic panel     Status: Abnormal   Collection Time: 09/19/20 12:12 AM  Result Value Ref Range   Sodium 137 135 - 145 mmol/L   Potassium 3.8 3.5 - 5.1 mmol/L   Chloride 108 98 - 111 mmol/L   CO2 19 (L) 22 - 32 mmol/L   Glucose, Bld 129 (H) 70 - 99 mg/dL   BUN 14 6 - 20 mg/dL   Creatinine, Ser 0.76 0.44 - 1.00 mg/dL   Calcium 9.1 8.9 - 10.3 mg/dL   Total Protein 6.8 6.5 - 8.1 g/dL   Albumin 3.1 (L) 3.5 - 5.0 g/dL   AST 19 15 - 41 U/L   ALT 21 0 - 44 U/L   Alkaline Phosphatase 86 38 - 126 U/L   Total Bilirubin 1.4 (H) 0.3 - 1.2 mg/dL   GFR, Estimated >60 >60 mL/min   Anion gap 10 5 - 15    B/RH(D) POSITIVE/-- (01/10 0000)  Imaging:    MAU Course/MDM: I have ordered labs and reviewed results. These were all normal  NST reviewed,reassuring  Treatments in MAU included NST.    Assessment: Single IUP at [redacted]w[redacted]d Chronic hypertension Normal BPs here, elevated at home, suspect cuff too small or machine needs calibration  Plan: Discharge home Preeclampsia precautions Labor precautions and fetal kick counts Follow up in Office for prenatal visits and recheck of BP and machine Encouraged to return  if she develops worsening of symptoms, increase in pain, fever, or other concerning symptoms.  Pt stable at time of discharge.  Hansel Feinstein CNM, MSN Certified Nurse-Midwife 09/18/2020 11:55 PM

## 2020-09-18 NOTE — Telephone Encounter (Signed)
Babyscript called for BP 145/70.  Called pt she notes that she has had 2 elevated BP readings of 140/70s.  No headache, no blurry vision, no RUQ pain.  Taking Labetalol- advised to take this this am.  Pt has appt tomorrow- discussed that will review at that time.  If prior to tomorrow should she note any preeclampsia signs, worsening of BP or other acute concerns call back or go to MAU.  Pt voiced good understanding.

## 2020-09-19 ENCOUNTER — Other Ambulatory Visit (HOSPITAL_COMMUNITY)
Admission: RE | Admit: 2020-09-19 | Discharge: 2020-09-19 | Disposition: A | Discharge status: Home / Self Care | Payer: Medicaid Other | Source: Ambulatory Visit | Attending: Obstetrics & Gynecology | Admitting: Obstetrics & Gynecology

## 2020-09-19 ENCOUNTER — Ambulatory Visit (INDEPENDENT_AMBULATORY_CARE_PROVIDER_SITE_OTHER): Payer: Medicaid Other | Admitting: Obstetrics & Gynecology

## 2020-09-19 ENCOUNTER — Encounter (HOSPITAL_COMMUNITY): Payer: Self-pay | Admitting: Obstetrics & Gynecology

## 2020-09-19 VITALS — BP 141/76 | HR 94 | Wt 231.0 lb

## 2020-09-19 DIAGNOSIS — I10 Essential (primary) hypertension: Secondary | ICD-10-CM

## 2020-09-19 DIAGNOSIS — O0990 Supervision of high risk pregnancy, unspecified, unspecified trimester: Secondary | ICD-10-CM | POA: Diagnosis not present

## 2020-09-19 LAB — COMPREHENSIVE METABOLIC PANEL
ALT: 21 U/L (ref 0–44)
AST: 19 U/L (ref 15–41)
Albumin: 3.1 g/dL — ABNORMAL LOW (ref 3.5–5.0)
Alkaline Phosphatase: 86 U/L (ref 38–126)
Anion gap: 10 (ref 5–15)
BUN: 14 mg/dL (ref 6–20)
CO2: 19 mmol/L — ABNORMAL LOW (ref 22–32)
Calcium: 9.1 mg/dL (ref 8.9–10.3)
Chloride: 108 mmol/L (ref 98–111)
Creatinine, Ser: 0.76 mg/dL (ref 0.44–1.00)
GFR, Estimated: >60 mL/min (ref >60)
Glucose, Bld: 129 mg/dL — ABNORMAL HIGH (ref 70–99)
Potassium: 3.8 mmol/L (ref 3.5–5.1)
Sodium: 137 mmol/L (ref 135–145)
Total Bilirubin: 1.4 mg/dL — ABNORMAL HIGH (ref 0.3–1.2)
Total Protein: 6.8 g/dL (ref 6.5–8.1)

## 2020-09-19 LAB — CBC
HCT: 33.9 % — ABNORMAL LOW (ref 36.0–46.0)
Hemoglobin: 11.8 g/dL — ABNORMAL LOW (ref 12.0–15.0)
MCH: 30 pg (ref 26.0–34.0)
MCHC: 34.8 g/dL (ref 30.0–36.0)
MCV: 86.3 fL (ref 80.0–100.0)
Platelets: 181 10*3/uL (ref 150–400)
RBC: 3.93 MIL/uL (ref 3.87–5.11)
RDW: 14.7 % (ref 11.5–15.5)
WBC: 12.1 10*3/uL — ABNORMAL HIGH (ref 4.0–10.5)
nRBC: 0 % (ref 0.0–0.2)

## 2020-09-19 LAB — PROTEIN / CREATININE RATIO, URINE
Creatinine, Urine: 55.91 mg/dL
Protein Creatinine Ratio: 0.13 mg/mg{Cre} (ref 0.00–0.15)
Total Protein, Urine: 7 mg/dL

## 2020-09-19 NOTE — Progress Notes (Signed)
Pt denies headache/visual changes Pt has noticed increase in yellow discharge. Denies odor or itching     PRENATAL VISIT NOTE  Subjective:  Carol Wright is a 34 y.o. [id number] at [redacted]w[redacted]d being seen today for ongoing prenatal care.  She is currently monitored for the following issues for this high-risk pregnancy and has Depression; Generalized anxiety disorder; Hx of myocardial infarction; Hepatic steatosis; Elevated blood pressure reading; PCO (polycystic ovaries); Hypertension; Low vitamin D level; Spontaneous dissection of coronary artery; H/O: cesarean section; Ventricular aneurysm; Morbid obesity ([place]); and Supervision of high risk pregnancy, antepartum on their problem list.  Patient reports no complaints.  Contractions: Not present. Vag. Bleeding: None.  Movement: Present. Denies leaking of fluid.   The following portions of the patient's history were reviewed and updated as appropriate: allergies, current medications, past family history, past medical history, past social history, past surgical history and problem list.   Objective:   Vitals:   09/19/20 1430  BP: (!) 141/76  Pulse: 94  Weight: 231 lb (104.8 kg)    Fetal Status: Fetal Heart Rate (bpm): 143   Movement: Present     General:  Alert, oriented and cooperative. Patient is in no acute distress.  Skin: Skin is warm and dry. No rash noted.   Cardiovascular: Normal heart rate noted  Respiratory: Normal respiratory effort, no problems with respiration noted  Abdomen: Soft, gravid, appropriate for gestational age.  Pain/Pressure: Absent     Pelvic: Cervical exam performed in the presence of a chaperone   external os 1 cm/internal os closed/thick/high     Extremities: Normal range of motion.  Edema: None  Mental Status: Normal mood and affect. Normal behavior. Normal judgment and thought content.   Assessment and Plan:  Pregnancy: [id number] at [redacted]w[redacted]d 1. Supervision of high risk pregnancy, antepartum - Culture, beta strep  (group b only) - Cervicovaginal ancillary only( [place])  2. Primary hypertension Pt went to MAU late last night for elevated BP.  Pt was 14 hours after last dose of labetalol when she took her BP.  Labs nml and BPs normal at MAU. Increase labetalol to 100 tid.  If not better then will try 200 bid.  If still not normal, then patient to call and discuss.  Pt will bring BP cuff to BPP visit on Monday to compare readings.    Term labor symptoms and general obstetric precautions including but not limited to vaginal bleeding, contractions, leaking of fluid and fetal movement were reviewed in detail with the patient. Please refer to After Visit Summary for other counseling recommendations.   No follow-ups on file.  Future Appointments  Date Time Provider [place]  09/23/2020  9:15 AM [facility] NURSE [facility]  09/23/2020  9:30 AM [facility] US3 [facility]  09/30/2020  3:15 PM [facility] NST [facility]    Silas [place], MD

## 2020-09-20 LAB — CERVICOVAGINAL ANCILLARY ONLY
Bacterial Vaginitis (gardnerella): NEGATIVE
Candida Glabrata: NEGATIVE
Candida Vaginitis: POSITIVE — AB
Chlamydia: NEGATIVE
Comment: NEGATIVE
Comment: NEGATIVE
Comment: NEGATIVE
Comment: NEGATIVE
Comment: NORMAL
Neisseria Gonorrhea: NEGATIVE

## 2020-09-22 LAB — CULTURE, BETA STREP (GROUP B ONLY)
MICRO NUMBER:: 12095128
SPECIMEN QUALITY:: ADEQUATE

## 2020-09-23 ENCOUNTER — Other Ambulatory Visit: Payer: Self-pay | Admitting: Obstetrics & Gynecology

## 2020-09-23 ENCOUNTER — Ambulatory Visit: Payer: Medicaid Other | Attending: Obstetrics

## 2020-09-23 ENCOUNTER — Other Ambulatory Visit: Payer: Self-pay

## 2020-09-23 ENCOUNTER — Encounter: Payer: Self-pay | Admitting: *Deleted

## 2020-09-23 ENCOUNTER — Ambulatory Visit: Payer: Medicaid Other | Admitting: *Deleted

## 2020-09-23 ENCOUNTER — Telehealth (HOSPITAL_COMMUNITY): Payer: Self-pay | Admitting: *Deleted

## 2020-09-23 VITALS — BP 117/74 | HR 89

## 2020-09-23 DIAGNOSIS — O0990 Supervision of high risk pregnancy, unspecified, unspecified trimester: Secondary | ICD-10-CM | POA: Insufficient documentation

## 2020-09-23 DIAGNOSIS — E282 Polycystic ovarian syndrome: Secondary | ICD-10-CM

## 2020-09-23 DIAGNOSIS — O99413 Diseases of the circulatory system complicating pregnancy, third trimester: Secondary | ICD-10-CM | POA: Diagnosis not present

## 2020-09-23 DIAGNOSIS — O10013 Pre-existing essential hypertension complicating pregnancy, third trimester: Secondary | ICD-10-CM

## 2020-09-23 DIAGNOSIS — Z3A36 36 weeks gestation of pregnancy: Secondary | ICD-10-CM | POA: Diagnosis not present

## 2020-09-23 DIAGNOSIS — O99283 Endocrine, nutritional and metabolic diseases complicating pregnancy, third trimester: Secondary | ICD-10-CM | POA: Diagnosis not present

## 2020-09-23 DIAGNOSIS — O34219 Maternal care for unspecified type scar from previous cesarean delivery: Secondary | ICD-10-CM | POA: Diagnosis not present

## 2020-09-23 DIAGNOSIS — I252 Old myocardial infarction: Secondary | ICD-10-CM | POA: Diagnosis not present

## 2020-09-23 DIAGNOSIS — O0993 Supervision of high risk pregnancy, unspecified, third trimester: Secondary | ICD-10-CM | POA: Diagnosis not present

## 2020-09-23 MED ORDER — TERCONAZOLE 0.4 % VA CREA: 1.0000 | TOPICAL_CREAM | Freq: Every day | VAGINAL | 0 refills | Status: DC

## 2020-09-23 NOTE — Telephone Encounter (Signed)
Preadmission screen  

## 2020-09-23 NOTE — Patient Instructions (Addendum)
Carol Wright  09/23/2020   Your procedure is scheduled on:  10/07/2020  Arrive at 27 at Entrance C on [address] at [facility]  and [employer] [employer] [address]. You are invited to use the FREE valet parking or use the Visitor's parking deck.  Pick up the phone at the desk and dial [phone].  Call this number if you have problems the morning of surgery: [phone]  Remember:   Do not eat food:(After Midnight) Desps de medianoche.  Do not drink clear liquids: (After Midnight) Desps de medianoche.  Take these medicines the morning of surgery with A SIP OF WATER:  Take labetalol as prescribed   Do not wear jewelry, make-up or nail polish.  Do not wear lotions, powders, or perfumes. Do not wear deodorant.  Do not shave 48 hours prior to surgery.  Do not bring valuables to the hospital.  [facility] is not   responsible for any belongings or valuables brought to the hospital.  Contacts, dentures or bridgework may not be worn into surgery.  Leave suitcase in the car. After surgery it may be brought to your room.  For patients admitted to the hospital, checkout time is 11:00 AM the day of              discharge.      Please read over the following fact sheets that you were given:     Preparing for Surgery

## 2020-09-24 ENCOUNTER — Encounter (HOSPITAL_COMMUNITY): Payer: Self-pay

## 2020-09-25 ENCOUNTER — Ambulatory Visit (INDEPENDENT_AMBULATORY_CARE_PROVIDER_SITE_OTHER): Payer: Medicaid Other | Admitting: Obstetrics & Gynecology

## 2020-09-25 ENCOUNTER — Other Ambulatory Visit: Payer: Self-pay

## 2020-09-25 VITALS — BP 127/75 | HR 97 | Wt 238.0 lb

## 2020-09-25 DIAGNOSIS — I10 Essential (primary) hypertension: Secondary | ICD-10-CM

## 2020-09-25 DIAGNOSIS — O0990 Supervision of high risk pregnancy, unspecified, unspecified trimester: Secondary | ICD-10-CM

## 2020-09-25 NOTE — Progress Notes (Signed)
   PRENATAL VISIT NOTE  Subjective:  Carol Wright is a 35 y.o. [id number] at [redacted]w[redacted]d being seen today for ongoing prenatal care.  She is currently monitored for the following issues for this high-risk pregnancy and has Depression; Generalized anxiety disorder; Hx of myocardial infarction; Hepatic steatosis; Elevated blood pressure reading; PCO (polycystic ovaries); Hypertension; Low vitamin D level; Spontaneous dissection of coronary artery; H/O: cesarean section; Ventricular aneurysm; Morbid obesity ([place]); and Supervision of high risk pregnancy, antepartum on their problem list.  Patient reports no complaints.  Contractions: Not present. Vag. Bleeding: None.  Movement: Present. Denies leaking of fluid.   The following portions of the patient's history were reviewed and updated as appropriate: allergies, current medications, past family history, past medical history, past social history, past surgical history and problem list.   Objective:   Vitals:   09/25/20 1044  BP: 127/75  Pulse: 97  Weight: 238 lb (108 kg)    Fetal Status: Fetal Heart Rate (bpm): 145   Movement: Present     General:  Alert, oriented and cooperative. Patient is in no acute distress.  Skin: Skin is warm and dry. No rash noted.   Cardiovascular: Normal heart rate noted  Respiratory: Normal respiratory effort, no problems with respiration noted  Abdomen: Soft, gravid, appropriate for gestational age.  Pain/Pressure: Present     Pelvic: Cervical exam deferred        Extremities: Normal range of motion.  Edema: Trace  Mental Status: Normal mood and affect. Normal behavior. Normal judgment and thought content.   Assessment and Plan:  Pregnancy: [id number] at [redacted]w[redacted]d 1. Supervision of high risk pregnancy, antepartum Last grwoth 8 pounds 10 ounces; scheduled for c/s  2. Primary hypertension BP 120s / 70s on baby Rx; Labetalol 100 mg tid.  Term labor symptoms and general obstetric precautions including but not limited to  vaginal bleeding, contractions, leaking of fluid and fetal movement were reviewed in detail with the patient. Please refer to After Visit Summary for other counseling recommendations.   No follow-ups on file.  Future Appointments  Date Time Provider [place]  09/25/2020 11:15 AM Guss Bunde, MD [facility]  09/30/2020  3:15 PM [facility] NST [facility]  10/04/2020 10:00 AM [facility]-LD PAT 1 [facility]IN[facility] None  10/04/2020 11:00 AM MC-SCREENING [facility]SD[facility] None    Silas [place], MD

## 2020-09-30 ENCOUNTER — Other Ambulatory Visit: Payer: Self-pay

## 2020-09-30 ENCOUNTER — Ambulatory Visit: Payer: Medicaid Other | Admitting: *Deleted

## 2020-09-30 ENCOUNTER — Ambulatory Visit (INDEPENDENT_AMBULATORY_CARE_PROVIDER_SITE_OTHER): Payer: Medicaid Other

## 2020-09-30 VITALS — BP 133/75 | HR 96 | Wt 242.8 lb

## 2020-09-30 DIAGNOSIS — O163 Unspecified maternal hypertension, third trimester: Secondary | ICD-10-CM | POA: Diagnosis not present

## 2020-09-30 DIAGNOSIS — Z3A37 37 weeks gestation of pregnancy: Secondary | ICD-10-CM | POA: Diagnosis not present

## 2020-09-30 NOTE — Progress Notes (Signed)
Pt informed that the ultrasound is considered a limited OB ultrasound and is not intended to be a complete ultrasound exam.  Patient also informed that the ultrasound is not being completed with the intent of assessing for fetal or placental anomalies or any pelvic abnormalities.  Explained that the purpose of today's ultrasound is to assess for presentation, BPP and amniotic fluid volume.  Patient acknowledges the purpose of the exam and the limitations of the study.    Repeat C/S scheduled on 7/25

## 2020-10-03 ENCOUNTER — Ambulatory Visit (INDEPENDENT_AMBULATORY_CARE_PROVIDER_SITE_OTHER): Payer: Medicaid Other | Admitting: Obstetrics & Gynecology

## 2020-10-03 ENCOUNTER — Other Ambulatory Visit: Payer: Self-pay

## 2020-10-03 VITALS — BP 136/92 | HR 103 | Wt 241.1 lb

## 2020-10-03 DIAGNOSIS — O0990 Supervision of high risk pregnancy, unspecified, unspecified trimester: Secondary | ICD-10-CM

## 2020-10-03 DIAGNOSIS — O163 Unspecified maternal hypertension, third trimester: Secondary | ICD-10-CM

## 2020-10-03 DIAGNOSIS — I252 Old myocardial infarction: Secondary | ICD-10-CM

## 2020-10-03 NOTE — Progress Notes (Signed)
   PRENATAL VISIT NOTE  Subjective:  Carol Wright is a 35 y.o. [id number] at [redacted]w[redacted]d being seen today for ongoing prenatal care.  She is currently monitored for the following issues for this high-risk pregnancy and has Depression; Generalized anxiety disorder; Hx of myocardial infarction; Hepatic steatosis; Elevated blood pressure reading; PCO (polycystic ovaries); Hypertension; Low vitamin D level; Spontaneous dissection of coronary artery; H/O: cesarean section; Ventricular aneurysm; Morbid obesity ([place]); and Supervision of high risk pregnancy, antepartum on their problem list.  Patient reports no complaints.  Contractions: Not present. Vag. Bleeding: None.  Movement: Present. Denies leaking of fluid.   The following portions of the patient's history were reviewed and updated as appropriate: allergies, current medications, past family history, past medical history, past social history, past surgical history and problem list.   Objective:   Vitals:   10/03/20 1057  BP: (!) 138/92  Pulse: (!) 103  Weight: 241 lb 1.3 oz (109.4 kg)    Fetal Status: Fetal Heart Rate (bpm): 145   Movement: Present     General:  Alert, oriented and cooperative. Patient is in no acute distress.  Skin: Skin is warm and dry. No rash noted.   Cardiovascular: Normal heart rate noted  Respiratory: Normal respiratory effort, no problems with respiration noted  Abdomen: Soft, gravid, appropriate for gestational age.  Pain/Pressure: Present     Pelvic: Cervical exam deferred        Extremities: Normal range of motion.     Mental Status: Normal mood and affect. Normal behavior. Normal judgment and thought content.   Assessment and Plan:  Pregnancy: [id number] at [redacted]w[redacted]d 1. Hx of myocardial infarction Scheduled for rpt c/s on Monday; followed by high risk OB/cards team  2.  HTN Stable on labetalol 100 tid.  Takes BP at home and high low 130s/low 80s    Term labor symptoms and general obstetric precautions including  but not limited to vaginal bleeding, contractions, leaking of fluid and fetal movement were reviewed in detail with the patient. Please refer to After Visit Summary for other counseling recommendations.   No follow-ups on file.  Future Appointments  Date Time Provider [place]  10/04/2020 10:00 AM [facility]-LD PAT 1 [facility]INDC None  10/04/2020 11:00 AM MC-SCREENING [facility] None    Silas [place], MD

## 2020-10-04 ENCOUNTER — Other Ambulatory Visit (HOSPITAL_COMMUNITY)
Admission: RE | Admit: 2020-10-04 | Discharge: 2020-10-04 | Disposition: A | Discharge status: Home / Self Care | Payer: Medicaid Other | Source: Ambulatory Visit | Attending: Obstetrics and Gynecology | Admitting: Obstetrics and Gynecology

## 2020-10-04 ENCOUNTER — Encounter (HOSPITAL_COMMUNITY)
Admission: RE | Admit: 2020-10-04 | Discharge: 2020-10-04 | Disposition: A | Discharge status: Home / Self Care | Payer: Medicaid Other | Source: Ambulatory Visit | Attending: Obstetrics and Gynecology | Admitting: Obstetrics and Gynecology

## 2020-10-04 DIAGNOSIS — Z20822 Contact with and (suspected) exposure to covid-19: Secondary | ICD-10-CM | POA: Diagnosis not present

## 2020-10-04 DIAGNOSIS — Z01812 Encounter for preprocedural laboratory examination: Secondary | ICD-10-CM | POA: Insufficient documentation

## 2020-10-04 HISTORY — DX: Personal history of other complications of pregnancy, childbirth and the puerperium: Z87.59

## 2020-10-04 LAB — TYPE AND SCREEN
ABO/RH(D): B POS
Antibody Screen: NEGATIVE

## 2020-10-04 LAB — SARS CORONAVIRUS 2 (TAT 6-24 HRS): SARS Coronavirus 2: NEGATIVE

## 2020-10-04 LAB — CBC
HCT: 34.6 % — ABNORMAL LOW (ref 36.0–46.0)
Hemoglobin: 11.4 g/dL — ABNORMAL LOW (ref 12.0–15.0)
MCH: 29.3 pg (ref 26.0–34.0)
MCHC: 32.9 g/dL (ref 30.0–36.0)
MCV: 88.9 fL (ref 80.0–100.0)
Platelets: 177 10*3/uL (ref 150–400)
RBC: 3.89 MIL/uL (ref 3.87–5.11)
RDW: 15.2 % (ref 11.5–15.5)
WBC: 11.9 10*3/uL — ABNORMAL HIGH (ref 4.0–10.5)
nRBC: 0 % (ref 0.0–0.2)

## 2020-10-05 LAB — RPR: RPR Ser Ql: NONREACTIVE

## 2020-10-05 NOTE — Anesthesia Preprocedure Evaluation (Addendum)
Anesthesia Evaluation  Patient identified by MRN, date of birth, ID band Patient awake    Reviewed: Allergy & Precautions, NPO status , Patient's Chart, lab work & pertinent test results  History of Anesthesia Complications Negative for: history of anesthetic complications  Airway Mallampati: II  TM Distance: >3 FB Neck ROM: Full    Dental  (+) Missing,    Pulmonary neg pulmonary ROS,    Pulmonary exam normal        Cardiovascular hypertension, + Past MI ([place] 2013, BMS placed)  Normal cardiovascular exam  TTE 05/2020: EF 45-50%, mild LAE, mild MR    Neuro/Psych Anxiety Depression negative neurological ROS     GI/Hepatic negative GI ROS, Neg liver ROS,   Endo/Other  Morbid obesity  Renal/GU negative Renal ROS  negative genitourinary   Musculoskeletal negative musculoskeletal ROS (+)   Abdominal   Peds  Hematology Hgb 11.4, plts 177k    Anesthesia Other Findings Day of surgery medications reviewed with patient.  Reproductive/Obstetrics (+) Pregnancy (Hx of C/S x2 )                           Anesthesia Physical Anesthesia Plan  ASA: 4  Anesthesia Plan: Combined Spinal and Epidural   Post-op Pain Management:    Induction:   PONV Risk Score and Plan: 4 or greater and Treatment may vary due to age or medical condition, Ondansetron and Dexamethasone  Airway Management Planned: Natural Airway  Additional Equipment: None  Intra-op Plan:   Post-operative Plan:   Informed Consent: I have reviewed the patients History and Physical, chart, labs and discussed the procedure including the risks, benefits and alternatives for the proposed anesthesia with the patient or authorized representative who has indicated his/her understanding and acceptance.       Plan Discussed with: CRNA  Anesthesia Plan Comments:        Anesthesia Quick Evaluation

## 2020-10-07 ENCOUNTER — Encounter (HOSPITAL_COMMUNITY): Payer: Self-pay | Admitting: Obstetrics and Gynecology

## 2020-10-07 ENCOUNTER — Inpatient Hospital Stay (HOSPITAL_COMMUNITY): Payer: Medicaid Other | Admitting: Anesthesiology

## 2020-10-07 ENCOUNTER — Encounter (HOSPITAL_COMMUNITY)
Admission: RE | Disposition: A | Discharge status: Home / Self Care | Payer: Self-pay | Source: Home / Self Care | Attending: Obstetrics and Gynecology

## 2020-10-07 ENCOUNTER — Other Ambulatory Visit: Payer: Self-pay

## 2020-10-07 ENCOUNTER — Inpatient Hospital Stay (HOSPITAL_COMMUNITY)
Admission: RE | Admit: 2020-10-07 | Discharge: 2020-10-09 | DRG: 787 | Disposition: A | Discharge status: Home / Self Care | Payer: Medicaid Other | Attending: Obstetrics and Gynecology | Admitting: Obstetrics and Gynecology

## 2020-10-07 DIAGNOSIS — K760 Fatty (change of) liver, not elsewhere classified: Secondary | ICD-10-CM | POA: Diagnosis present

## 2020-10-07 DIAGNOSIS — I252 Old myocardial infarction: Secondary | ICD-10-CM

## 2020-10-07 DIAGNOSIS — O1092 Unspecified pre-existing hypertension complicating childbirth: Secondary | ICD-10-CM | POA: Diagnosis not present

## 2020-10-07 DIAGNOSIS — Z8679 Personal history of other diseases of the circulatory system: Secondary | ICD-10-CM | POA: Diagnosis not present

## 2020-10-07 DIAGNOSIS — O34219 Maternal care for unspecified type scar from previous cesarean delivery: Secondary | ICD-10-CM | POA: Diagnosis not present

## 2020-10-07 DIAGNOSIS — O34211 Maternal care for low transverse scar from previous cesarean delivery: Secondary | ICD-10-CM | POA: Diagnosis not present

## 2020-10-07 DIAGNOSIS — I253 Aneurysm of heart: Secondary | ICD-10-CM | POA: Diagnosis present

## 2020-10-07 DIAGNOSIS — O0990 Supervision of high risk pregnancy, unspecified, unspecified trimester: Secondary | ICD-10-CM

## 2020-10-07 DIAGNOSIS — O99214 Obesity complicating childbirth: Secondary | ICD-10-CM | POA: Diagnosis present

## 2020-10-07 DIAGNOSIS — E282 Polycystic ovarian syndrome: Secondary | ICD-10-CM | POA: Diagnosis present

## 2020-10-07 DIAGNOSIS — Z20822 Contact with and (suspected) exposure to covid-19: Secondary | ICD-10-CM | POA: Diagnosis not present

## 2020-10-07 DIAGNOSIS — I10 Essential (primary) hypertension: Secondary | ICD-10-CM | POA: Diagnosis present

## 2020-10-07 DIAGNOSIS — Z7982 Long term (current) use of aspirin: Secondary | ICD-10-CM

## 2020-10-07 DIAGNOSIS — I2542 Coronary artery dissection: Secondary | ICD-10-CM | POA: Diagnosis present

## 2020-10-07 DIAGNOSIS — O99284 Endocrine, nutritional and metabolic diseases complicating childbirth: Secondary | ICD-10-CM | POA: Diagnosis not present

## 2020-10-07 DIAGNOSIS — O1002 Pre-existing essential hypertension complicating childbirth: Principal | ICD-10-CM | POA: Diagnosis present

## 2020-10-07 DIAGNOSIS — Z3A38 38 weeks gestation of pregnancy: Secondary | ICD-10-CM | POA: Diagnosis not present

## 2020-10-07 DIAGNOSIS — Z98891 History of uterine scar from previous surgery: Secondary | ICD-10-CM

## 2020-10-07 DIAGNOSIS — O26893 Other specified pregnancy related conditions, third trimester: Secondary | ICD-10-CM | POA: Diagnosis not present

## 2020-10-07 DIAGNOSIS — Z30430 Encounter for insertion of intrauterine contraceptive device: Secondary | ICD-10-CM

## 2020-10-07 SURGERY — Surgical Case
Anesthesia: Spinal

## 2020-10-07 MED ORDER — PROMETHAZINE HCL 25 MG/ML IJ SOLN
6.2500 mg | INTRAMUSCULAR | Status: DC | PRN
Start: 2020-10-07 — End: 2020-10-07

## 2020-10-07 MED ORDER — NALBUPHINE HCL 10 MG/ML IJ SOLN: 5.0000 mg | Freq: Once | INTRAMUSCULAR | Status: DC | PRN

## 2020-10-07 MED ORDER — DIBUCAINE (PERIANAL) 1 % EX OINT: 1.0000 "application " | TOPICAL_OINTMENT | CUTANEOUS | Status: DC | PRN

## 2020-10-07 MED ORDER — WITCH HAZEL-GLYCERIN EX PADS: 1.0000 "application " | MEDICATED_PAD | CUTANEOUS | Status: DC | PRN

## 2020-10-07 MED ORDER — SIMETHICONE 80 MG PO CHEW
80.0000 mg | CHEWABLE_TABLET | Freq: Three times a day (TID) | ORAL | Status: DC
  Administered 2020-10-08 (×3): 80 mg via ORAL
  Filled 2020-10-07 (×4): qty 1

## 2020-10-07 MED ORDER — ONDANSETRON HCL 4 MG/2ML IJ SOLN
INTRAMUSCULAR | Status: AC
  Filled 2020-10-07: qty 2

## 2020-10-07 MED ORDER — ACETAMINOPHEN 500 MG PO TABS: 1000.0000 mg | ORAL_TABLET | Freq: Once | ORAL | Status: DC

## 2020-10-07 MED ORDER — NALOXONE HCL 0.4 MG/ML IJ SOLN: 0.4000 mg | INTRAMUSCULAR | Status: DC | PRN

## 2020-10-07 MED ORDER — ACETAMINOPHEN 500 MG PO TABS
1000.0000 mg | ORAL_TABLET | Freq: Four times a day (QID) | ORAL | Status: AC
  Administered 2020-10-07 – 2020-10-08 (×3): 1000 mg via ORAL
  Filled 2020-10-07 (×3): qty 2

## 2020-10-07 MED ORDER — MORPHINE SULFATE (PF) 0.5 MG/ML IJ SOLN
INTRAMUSCULAR | Status: DC | PRN
  Administered 2020-10-07: .15 mg via INTRATHECAL

## 2020-10-07 MED ORDER — SODIUM CHLORIDE 0.9 % IR SOLN
Status: DC | PRN
  Administered 2020-10-07: 1

## 2020-10-07 MED ORDER — ENOXAPARIN SODIUM 60 MG/0.6ML IJ SOSY
50.0000 mg | PREFILLED_SYRINGE | INTRAMUSCULAR | Status: DC
  Administered 2020-10-08: 50 mg via SUBCUTANEOUS
  Filled 2020-10-07 (×2): qty 0.6

## 2020-10-07 MED ORDER — PHENYLEPHRINE HCL-NACL 20-0.9 MG/250ML-% IV SOLN
INTRAVENOUS | Status: DC | PRN
  Administered 2020-10-07: 60 ug/min via INTRAVENOUS

## 2020-10-07 MED ORDER — KETOROLAC TROMETHAMINE 30 MG/ML IJ SOLN: 30.0000 mg | Freq: Four times a day (QID) | INTRAMUSCULAR | Status: AC | PRN

## 2020-10-07 MED ORDER — MEDROXYPROGESTERONE ACETATE 150 MG/ML IM SUSP: 150.0000 mg | INTRAMUSCULAR | Status: DC | PRN

## 2020-10-07 MED ORDER — CEFAZOLIN SODIUM-DEXTROSE 2-4 GM/100ML-% IV SOLN: 2.0000 g | INTRAVENOUS | Status: DC

## 2020-10-07 MED ORDER — LEVONORGESTREL 20.1 MCG/DAY IU IUD
1.0000 | INTRAUTERINE_SYSTEM | Freq: Once | INTRAUTERINE | Status: AC
  Administered 2020-10-07: 1 via INTRAUTERINE

## 2020-10-07 MED ORDER — CEFAZOLIN SODIUM-DEXTROSE 2-4 GM/100ML-% IV SOLN
INTRAVENOUS | Status: AC
  Filled 2020-10-07: qty 100

## 2020-10-07 MED ORDER — COCONUT OIL OIL: 1.0000 "application " | TOPICAL_OIL | Status: DC | PRN

## 2020-10-07 MED ORDER — OXYTOCIN-SODIUM CHLORIDE 30-0.9 UT/500ML-% IV SOLN
INTRAVENOUS | Status: DC | PRN
  Administered 2020-10-07: 30 [IU] via INTRAVENOUS

## 2020-10-07 MED ORDER — NALBUPHINE HCL 10 MG/ML IJ SOLN: 5.0000 mg | INTRAMUSCULAR | Status: DC | PRN

## 2020-10-07 MED ORDER — ASPIRIN 81 MG PO CHEW
81.0000 mg | CHEWABLE_TABLET | Freq: Every day | ORAL | Status: DC
  Administered 2020-10-07 – 2020-10-08 (×2): 81 mg via ORAL
  Filled 2020-10-07 (×3): qty 1

## 2020-10-07 MED ORDER — ACETAMINOPHEN 160 MG/5ML PO SOLN: 1000.0000 mg | Freq: Once | ORAL | Status: DC

## 2020-10-07 MED ORDER — KETOROLAC TROMETHAMINE 30 MG/ML IJ SOLN
30.0000 mg | Freq: Four times a day (QID) | INTRAMUSCULAR | Status: AC
  Administered 2020-10-07 – 2020-10-08 (×3): 30 mg via INTRAVENOUS
  Filled 2020-10-07 (×3): qty 1

## 2020-10-07 MED ORDER — LACTATED RINGERS IV SOLN: INTRAVENOUS | Status: DC

## 2020-10-07 MED ORDER — SODIUM CHLORIDE 0.9 % IV SOLN: INTRAVENOUS | Status: DC | PRN

## 2020-10-07 MED ORDER — DEXAMETHASONE SODIUM PHOSPHATE 10 MG/ML IJ SOLN
INTRAMUSCULAR | Status: DC | PRN
  Administered 2020-10-07: 10 mg via INTRAVENOUS

## 2020-10-07 MED ORDER — SODIUM CHLORIDE 0.9% FLUSH: 3.0000 mL | INTRAVENOUS | Status: DC | PRN

## 2020-10-07 MED ORDER — ONDANSETRON HCL 4 MG/2ML IJ SOLN: 4.0000 mg | Freq: Three times a day (TID) | INTRAMUSCULAR | Status: DC | PRN

## 2020-10-07 MED ORDER — NALOXONE HCL 4 MG/10ML IJ SOLN
1.0000 ug/kg/h | INTRAVENOUS | Status: DC | PRN
  Filled 2020-10-07: qty 5

## 2020-10-07 MED ORDER — LABETALOL HCL 100 MG PO TABS
100.0000 mg | ORAL_TABLET | Freq: Three times a day (TID) | ORAL | Status: DC
  Administered 2020-10-07 – 2020-10-08 (×3): 100 mg via ORAL
  Filled 2020-10-07 (×5): qty 1

## 2020-10-07 MED ORDER — MENTHOL 3 MG MT LOZG: 1.0000 | LOZENGE | OROMUCOSAL | Status: DC | PRN

## 2020-10-07 MED ORDER — FENTANYL CITRATE (PF) 100 MCG/2ML IJ SOLN: 25.0000 ug | INTRAMUSCULAR | Status: DC | PRN

## 2020-10-07 MED ORDER — SENNOSIDES-DOCUSATE SODIUM 8.6-50 MG PO TABS
2.0000 | ORAL_TABLET | Freq: Every day | ORAL | Status: DC
  Administered 2020-10-08: 2 via ORAL
  Filled 2020-10-07 (×2): qty 2

## 2020-10-07 MED ORDER — ONDANSETRON HCL 4 MG/2ML IJ SOLN
INTRAMUSCULAR | Status: DC | PRN
Start: 2020-10-07 — End: 2020-10-07
  Administered 2020-10-07: 4 mg via INTRAVENOUS

## 2020-10-07 MED ORDER — LIDOCAINE-EPINEPHRINE (PF) 2 %-1:200000 IJ SOLN
INTRAMUSCULAR | Status: AC
  Filled 2020-10-07: qty 20

## 2020-10-07 MED ORDER — ZOLPIDEM TARTRATE 5 MG PO TABS: 5.0000 mg | ORAL_TABLET | Freq: Every evening | ORAL | Status: DC | PRN

## 2020-10-07 MED ORDER — GABAPENTIN 100 MG PO CAPS
100.0000 mg | ORAL_CAPSULE | Freq: Two times a day (BID) | ORAL | Status: DC
  Administered 2020-10-07 – 2020-10-08 (×3): 100 mg via ORAL
  Filled 2020-10-07 (×4): qty 1

## 2020-10-07 MED ORDER — SIMETHICONE 80 MG PO CHEW: 80.0000 mg | CHEWABLE_TABLET | ORAL | Status: DC | PRN

## 2020-10-07 MED ORDER — CEFAZOLIN SODIUM 1 G IJ SOLR
INTRAMUSCULAR | Status: AC
  Filled 2020-10-07: qty 20

## 2020-10-07 MED ORDER — BUPIVACAINE IN DEXTROSE 0.75-8.25 % IT SOLN
INTRATHECAL | Status: DC | PRN
  Administered 2020-10-07: 1.6 mL via INTRATHECAL

## 2020-10-07 MED ORDER — KETOROLAC TROMETHAMINE 30 MG/ML IJ SOLN
30.0000 mg | Freq: Once | INTRAMUSCULAR | Status: AC
  Administered 2020-10-07: 30 mg via INTRAVENOUS

## 2020-10-07 MED ORDER — TETANUS-DIPHTH-ACELL PERTUSSIS 5-2.5-18.5 LF-MCG/0.5 IM SUSY: 0.5000 mL | PREFILLED_SYRINGE | Freq: Once | INTRAMUSCULAR | Status: DC

## 2020-10-07 MED ORDER — FENTANYL CITRATE (PF) 100 MCG/2ML IJ SOLN
INTRAMUSCULAR | Status: DC | PRN
  Administered 2020-10-07: 15 ug via INTRATHECAL

## 2020-10-07 MED ORDER — POVIDONE-IODINE 10 % EX SWAB
2.0000 | Freq: Once | CUTANEOUS | Status: AC
Start: 2020-10-07 — End: 2020-10-07
  Administered 2020-10-07: 2 via TOPICAL

## 2020-10-07 MED ORDER — OXYTOCIN-SODIUM CHLORIDE 30-0.9 UT/500ML-% IV SOLN: 2.5000 [IU]/h | INTRAVENOUS | Status: AC

## 2020-10-07 MED ORDER — DIPHENHYDRAMINE HCL 25 MG PO CAPS: 25.0000 mg | ORAL_CAPSULE | Freq: Four times a day (QID) | ORAL | Status: DC | PRN

## 2020-10-07 MED ORDER — OXYCODONE-ACETAMINOPHEN 5-325 MG PO TABS: 1.0000 | ORAL_TABLET | ORAL | Status: DC | PRN

## 2020-10-07 MED ORDER — DIPHENHYDRAMINE HCL 50 MG/ML IJ SOLN: 12.5000 mg | Freq: Four times a day (QID) | INTRAMUSCULAR | Status: DC | PRN

## 2020-10-07 MED ORDER — FENTANYL CITRATE (PF) 100 MCG/2ML IJ SOLN
INTRAMUSCULAR | Status: AC
  Filled 2020-10-07: qty 2

## 2020-10-07 MED ORDER — LIDOCAINE-EPINEPHRINE (PF) 2 %-1:200000 IJ SOLN
INTRAMUSCULAR | Status: DC | PRN
  Administered 2020-10-07: 4 mL via EPIDURAL
  Administered 2020-10-07: 2 mL via EPIDURAL

## 2020-10-07 MED ORDER — IBUPROFEN 600 MG PO TABS
600.0000 mg | ORAL_TABLET | Freq: Four times a day (QID) | ORAL | Status: DC
  Administered 2020-10-08 – 2020-10-09 (×3): 600 mg via ORAL
  Filled 2020-10-07 (×3): qty 1

## 2020-10-07 MED ORDER — DEXAMETHASONE SODIUM PHOSPHATE 10 MG/ML IJ SOLN
INTRAMUSCULAR | Status: AC
  Filled 2020-10-07: qty 1

## 2020-10-07 MED ORDER — CEFAZOLIN SODIUM-DEXTROSE 1-4 GM/50ML-% IV SOLN
INTRAVENOUS | Status: DC | PRN
  Administered 2020-10-07: 2 g via INTRAVENOUS

## 2020-10-07 MED ORDER — PRENATAL MULTIVITAMIN CH
1.0000 | ORAL_TABLET | Freq: Every day | ORAL | Status: DC
  Administered 2020-10-08: 1 via ORAL
  Filled 2020-10-07: qty 1

## 2020-10-07 SURGICAL SUPPLY — 25 items
CLOTH BEACON ORANGE TIMEOUT ST (SAFETY) ×2 IMPLANT
DRSG OPSITE POSTOP 4X10 (GAUZE/BANDAGES/DRESSINGS) ×2 IMPLANT
DRSG PAD ABDOMINAL 8X10 ST (GAUZE/BANDAGES/DRESSINGS) ×2 IMPLANT
ELECT REM PT RETURN 9FT ADLT (ELECTROSURGICAL) ×2
ELECTRODE REM PT RTRN 9FT ADLT (ELECTROSURGICAL) ×1 IMPLANT
GAUZE SPONGE 4X4 12PLY STRL LF (GAUZE/BANDAGES/DRESSINGS) ×4 IMPLANT
GOWN STRL REUS W/TWL LRG LVL3 (GOWN DISPOSABLE) ×12 IMPLANT
HEMOSTAT ARISTA ABSORB 3G PWDR (HEMOSTASIS) ×2 IMPLANT
NS IRRIG 1000ML POUR BTL (IV SOLUTION) ×2 IMPLANT
PACK C SECTION WH (CUSTOM PROCEDURE TRAY) ×2 IMPLANT
PAD OB MATERNITY 4.3X12.25 (PERSONAL CARE ITEMS) ×2 IMPLANT
PENCIL SMOKE EVAC W/HOLSTER (ELECTROSURGICAL) ×2 IMPLANT
RETRACTOR WND ALEXIS 25 LRG (MISCELLANEOUS) ×1 IMPLANT
RTRCTR C-SECT PINK 25CM LRG (MISCELLANEOUS) ×2 IMPLANT
RTRCTR WOUND ALEXIS 25CM LRG (MISCELLANEOUS) ×2
SUT MNCRL 0 VIOLET CTX 36 (SUTURE) ×2 IMPLANT
SUT MONOCRYL 0 CTX 36 (SUTURE) ×2
SUT PLAIN 2 0 (SUTURE) ×1
SUT PLAIN ABS 2-0 CT1 27XMFL (SUTURE) ×1 IMPLANT
SUT VIC AB 0 CTX 36 (SUTURE) ×1
SUT VIC AB 0 CTX36XBRD ANBCTRL (SUTURE) ×1 IMPLANT
SUT VIC AB 2-0 CT1 27 (SUTURE) ×1
SUT VIC AB 2-0 CT1 TAPERPNT 27 (SUTURE) ×1 IMPLANT
SUT VIC AB 4-0 KS 27 (SUTURE) ×2 IMPLANT
WATER STERILE IRR 1000ML POUR (IV SOLUTION) ×2 IMPLANT

## 2020-10-07 NOTE — Transfer of Care (Signed)
Immediate Anesthesia Transfer of Care Note  Patient: Carol Wright  Procedure(s) Performed: CESAREAN SECTION  Patient Location: PACU  Anesthesia Type:Spinal and Epidural  Level of Consciousness: awake, alert  and oriented  Airway & Oxygen Therapy: Patient Spontanous Breathing  Post-op Assessment: Report given to RN and Post -op Vital signs reviewed and stable  Post vital signs: Reviewed and stable  Last Vitals:  Vitals Value Taken Time  BP 129/87 10/07/20 1745  Temp 36.7 C 10/07/20 1715  Pulse 92 10/07/20 1756  Resp 20 10/07/20 1756  SpO2 99 % 10/07/20 1756  Vitals shown include unvalidated device data.  Last Pain:  Vitals:   10/07/20 1715  TempSrc: Oral         Complications: No notable events documented.

## 2020-10-07 NOTE — Op Note (Addendum)
Carol Wright PROCEDURE DATE: 10/07/2020  PREOPERATIVE DIAGNOSES: Intrauterine pregnancy at 24w5dweeks gestation; history of two prior cesarean sections  POSTOPERATIVE DIAGNOSES: The same, Liletta IUD placement, OB hemorrhage  PROCEDURE: Repeat Low Transverse Cesarean Section, Post placental IUD insertion  SURGEON:   Dr. MClayton Lefort MD  ASSISTANT:   Dr. LLynnda Shields MD Dr. SDarrelyn Hillock DO    ANESTHESIOLOGY TEAM: Anesthesiologist: HBrennan Bailey MD CRNA: ALyda Jester CRNA  INDICATIONS: Carol Handalis a 35y.o. [id number] 33w5dere for cesarean section secondary to the indications listed under preoperative diagnoses; please see preoperative note for further details.  The risks of surgery were discussed with the patient including but were not limited to: bleeding which may require transfusion or reoperation; infection which may require antibiotics; injury to bowel, bladder, ureters or other surrounding organs; injury to the fetus; need for additional procedures including hysterectomy in the event of a life-threatening hemorrhage; formation of adhesions; placental abnormalities wth subsequent pregnancies; incisional problems; thromboembolic phenomenon and other postoperative/anesthesia complications.  The patient concurred with the proposed plan, giving informed written consent for the procedure.    FINDINGS:  Viable female infant in cephalic presentation.  Apgars 8 and 9.  Clear amniotic fluid.  Intact placenta, three vessel cord. Moderate adhesive disease of the anterior uterine segment with a dense adhesion at the fundus that precluded the ability to exteriorize the uterus. Fallopian tubes on limited exam appeared normal, ovaries not visualized.   ANESTHESIA: Epidural  INTRAVENOUS FLUIDS: 1400 ml   ESTIMATED BLOOD LOSS: 1420 ml URINE OUTPUT:  250 ml SPECIMENS: Placenta sent to L&D COMPLICATIONS: Postpartum hemorrhage  PROCEDURE IN DETAIL:  The patient preoperatively  received intravenous antibiotics and had sequential compression devices applied to her lower extremities.  She was then taken to the operating room where CSE anesthesia was administered. Anesthesia was dosed up to surgical level and was found to be adequate. She was then placed in a dorsal supine position with a leftward tilt, and prepped and draped in a sterile manner.  A foley catheter was placed into her bladder and attached to constant gravity.  After an adequate timeout was performed, a Pfannenstiel skin incision was made with scalpel on her preexisting scar and carried through to the underlying layer of fascia. The fascia was incised in the midline, and this incision was extended bilaterally using the Mayo scissors.  Kocher clamps were applied to the superior aspect of the fascial incision and the underlying rectus muscles were dissected off bluntly and sharply.  A similar process was carried out on the inferior aspect of the fascial incision. The rectus muscles were separated in the midline and the peritoneum was entered sharply with Metzenbaum scissors after elevating with two hemostats. The Alexis self-retaining retractor was introduced into the abdominal cavity.  Attention was turned to the lower uterine segment where a low transverse hysterotomy was made with a scalpel and extended bilaterally bluntly. There was difficulty in elevating the fetal head so a vacuum was called for. There were two separate attempts with two pop offs. At that point the incision was widened on the L rectus muscles with bandage scissors. Dr. BaElgie Congoas called in for assist, however with continued fundal pressure and use of kiwi vacuum, the infant was successfully delivered prior to assist. The cord was clamped and cut after less than one minute, and the infant was handed over to the awaiting neonatology team. Uterine massage was then administered, and the placenta delivered intact with a three-vessel  cord. The uterus was then  cleared of clots and debris. Liletta IUD was placed into fundus with strings placed through the cervical os using a long Kelly clamp. The hysterotomy was closed with 0 Monocryl in a running locked fashion, and an imbricating layer was also placed with 0 Monocryl. The pelvis was cleared of all clot and debris. Hemostasis was confirmed on all surfaces.  Arista was placed. The retractor was removed. The fascia was then closed using 0 Vicry in a running fashion.  The subcutaneous layer was irrigated, reapproximated with 2-0 plain gut running stitches and the skin was closed with a 4-0 Vicryl subcuticular stitch. The patient tolerated the procedure well. Sponge, instrument and needle counts were correct x 3.  She was taken to the recovery room in stable condition.    Darrelyn Hillock, DO  OB Fellow, [facility] for [facility] of Attending Supervision of Obstetric Fellow During Surgery: An experienced assistant was required given the standard of surgical care given the complexity of the case.  This assistant was needed for exposure, dissection, suctioning, retraction, instrument exchange, assisting with delivery with administration of fundal pressure, and for overall help during the procedure. Surgery was performed with the Obstetric Fellow under my supervision and collaboration.  I was present and scrubbed for the entire procedure.   I have reviewed the Obstetric Fellow's operative report, and I agree with the documentation. I have also made any necessary editorial changes.  Clarnce Flock, MD/MPH Attending Family Medicine Physician, [facility] for [facility], [place]

## 2020-10-07 NOTE — H&P (Signed)
Obstetric Preoperative History and Physical  Carol Wright is a 35 y.o. [id number] with IUP at 76w5dpresenting for scheduled cesarean section.  No acute concerns.   Prenatal Course Source of Care: [facility] Serwith onset of care at 10 weeks. Pregnancy complications or risks: --GAD (not on medication)  --Chronic Hypertension: on Labetalol 100 TID --History of MI/coronary artery dissection  --PCOS on metformin   She plans to breastfeed, plans to bottle feed She desires IUD for postpartum contraception.   Prenatal labs and studies: ABO, Rh: --/--/B POS (07/22 1013) Antibody: NEG (07/22 1013) Rubella: 5.37 (01/10 0000) RPR: NON REACTIVE (07/22 1009)  HBsAg: NON-REACTIVE (01/10 0000)  HIV: NON-REACTIVE (05/09 0817)  GBS: neg 1 hr Glucola  normal  Genetic screening  AFP WNL Anatomy UKoreanormal  Prenatal Transfer Tool  Maternal Diabetes: No Genetic Screening: Normal Maternal Ultrasounds/Referrals: Normal Fetal Ultrasounds or other Referrals:  None Maternal Substance Abuse:  No Significant Maternal Medications:  Meds include: Other: metformin for PCOS Significant Maternal Lab Results: Group B Strep negative  Past Medical History:  Diagnosis Date   Depression    Generalized anxiety disorder    H/O arterial dissection    Hepatic steatosis    Via ultrasound.     History of pre-eclampsia    Hyperlipidemia    Myocardial infarction ([place] 2013   Obesity (BMI 30-39.9)    PCOS (polycystic ovarian syndrome)    Pre-eclampsia    Spontaneous dissection of coronary artery    a. occurred in 2013 during pregnany. Underwent placement of BMS per the patient's report.     Past Surgical History:  Procedure Laterality Date   Arterial disection  03/17/2011   Bare metal stent  03/17/2011   CESAREAN SECTION  03/17/2011   cholcyst     CHOLECYSTECTOMY      OB History  Gravida Para Term Preterm AB Living  3 2 0 2   2  SAB IAB Ectopic Multiple Live Births               # Outcome Date GA  Lbr Len/2nd Weight Sex Delivery Anes PTL Lv  3 Current           2 Preterm      CS-LTranv     1 Preterm      CS-LTranv       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  Tobacco Use   Smoking status: Never   Smokeless tobacco: Never  Vaping Use   Vaping Use: Never used  Substance and Sexual Activity   Alcohol use: No   Drug use: No   Sexual activity: Yes    Birth control/protection: None  Other Topics Concern   Not on file  Social History Narrative   Not on file   Social Determinants of Health   Financial Resource Strain: Not on file  Food Insecurity: No Food Insecurity   Worried About Running Out of Food in the Last Year: Never true   Ran Out of Food in the Last Year: Never true  Transportation Needs: No Transportation Needs   Lack of Transportation (Medical): No   Lack of Transportation (Non-Medical): No  Physical Activity: Not on file  Stress: Not on file  Social Connections: Not  on file    Family History  Problem Relation Age of Onset   Hypertension Mother    Hypertension Maternal Grandmother    Heart attack Maternal Grandfather     Medications Prior to Admission  Medication Sig Dispense Refill Last Dose   aspirin 81 MG tablet Take 81 mg by mouth daily.   10/06/2020   cetirizine (ZYRTEC) 10 MG tablet Take 10 mg by mouth daily.   10/06/2020   labetalol (NORMODYNE) 100 MG tablet Take 100 mg by mouth in the morning, at noon, and at bedtime.   10/07/2020   metFORMIN (GLUCOPHAGE) 500 MG tablet Take 1 tablet (500 mg total) by mouth 2 (two) times daily with a meal. 180 tablet 2 10/06/2020   Prenatal Vit-Fe Fumarate-FA (MULTIVITAMIN-PRENATAL) 27-0.8 MG TABS tablet Take 1 tablet by mouth daily.   10/06/2020    No Known Allergies  Review of Systems: Negative except for  what is mentioned in HPI.  Physical Exam: BP 127/81   Pulse 83   Temp 98.2 F (36.8 C) (Oral)   Resp 18   Ht '5\' 3"'$  (1.6 m)   Wt 109.4 kg   LMP 01/10/2020   SpO2 100%   BMI 42.71 kg/m  FHR by Doppler: 153 bpm CONSTITUTIONAL: Well-developed, well-nourished female in no acute distress.  HENT:  Normocephalic, atraumatic. Oropharynx is clear and moist EYES: Conjunctivae and EOM are normal. No scleral icterus.  NECK: Normal range of motion, supple SKIN: Skin is warm and dry. No rash noted. Not diaphoretic. No erythema. [place]: Alert and oriented to person, place, and time. Normal reflexes, muscle tone coordination. No cranial nerve deficit noted. PSYCHIATRIC: Normal mood and affect. Normal behavior. CARDIOVASCULAR: Normal heart rate noted, regular rhythm RESPIRATORY: Effort and breath sounds normal, no problems with respiration noted ABDOMEN: Soft, nontender, nondistended, gravid. Well-healed Pfannenstiel incision. PELVIC: Deferred MUSCULOSKELETAL: Normal range of motion. No edema and no tenderness. 2+ distal pulses.   Pertinent Labs/Studies:   No results found for this or any previous visit (from the past 72 hour(s)).  Assessment and Plan  #RCS:  The risks of cesarean section discussed with the patient included but were not limited to: bleeding which may require transfusion or reoperation; infection which may require antibiotics; injury to bowel, bladder, ureters or other surrounding organs; injury to the fetus; need for additional procedures including hysterectomy in the event of a life-threatening hemorrhage; placental abnormalities with subsequent pregnancies, incisional problems, thromboembolic phenomenon and other postoperative/anesthesia complications. The patient concurred with the proposed plan, giving informed written consent for the procedure. Patient has been NPO since last night she will remain NPO for procedure. Anesthesia and OR aware. Preoperative prophylactic  antibiotics and SCDs ordered on call to the OR. To OR when ready.   #Contraception: after counseling would like hormonal IUD placed post placentally. Counseled on low risk of exulsion, irregular periods  #Anesthesia: Spinal #FWB: FHR 153 bpm #GBS/ID: neg #COVID: swab neg on 10/04/20 #MOF: breast and bottle #MOC: IUD #Circ: n/a  #cHTN: cont labetalol  #Hx coronary artery dissection: on aspirin, no DAPT  Clarnce Flock, MD/MPH Attending Family Medicine Physician, [facility] for [facility], [place]

## 2020-10-07 NOTE — Anesthesia Procedure Notes (Signed)
Epidural Patient location during procedure: OR Start time: 10/07/2020 3:10 PM End time: 10/07/2020 3:13 PM  Staffing Anesthesiologist: Brennan Bailey, MD Performed: anesthesiologist   Preanesthetic Checklist Completed: patient identified, IV checked, risks and benefits discussed, monitors and equipment checked, pre-op evaluation and timeout performed  Epidural Patient position: sitting Prep: DuraPrep and site prepped and draped Patient monitoring: continuous pulse ox, heart rate and blood pressure Approach: midline Location: L3-L4 Injection technique: LOR air  Needle:  Needle type: Tuohy  Needle gauge: 17 G Needle length: 9 cm Needle insertion depth: 7 cm Catheter type: closed end flexible Catheter size: 19 Gauge Catheter at skin depth: 12 cm Test dose: negative and 2% lidocaine with Epi 1:200 K  Assessment Sensory level: T4 Events: blood not aspirated, injection not painful, no injection resistance, no paresthesia and negative IV test  Additional Notes Patient identified. Risks, benefits, and alternatives discussed with patient including but not limited to bleeding, infection, nerve damage, paralysis, failed block, headache, blood pressure changes, nausea, vomiting, reactions to medication, itching, and postpartum back pain. Confirmed the patient's most recent platelet count. Confirmed with patient that they are not currently taking any anticoagulation, have any bleeding history, or any family history of bleeding disorders. Patient expressed understanding and wished to proceed. All questions were answered. Sterile technique was used throughout the entire procedure.    Crisp LOR with Tuohy needle on first pass. [place] 25g 161m spinal needle introduced through Tuohy with clear CSF return prior to injection of intrathecal medication. Spinal needle withdrawn and epidural catheter threaded easily. Negative aspiration of catheter for heme or CSF.

## 2020-10-07 NOTE — Discharge Summary (Addendum)
Postpartum Discharge Summary  Date of Service updated: 10/09/2020     Patient Name: Carol Wright DOB: [birth year] MRN: [id number]  Date of admission: 10/07/2020 Delivery date:10/07/2020  Delivering provider: Clarnce Flock  Date of discharge: 10/09/2020  Admitting diagnosis: Encounter for care and examination of mother immediately after delivery [Z39.0] Intrauterine pregnancy: [redacted]w[redacted]d    Secondary diagnosis:  Active Problems:   Hx of myocardial infarction   Hepatic steatosis   Hypertension   Spontaneous dissection of coronary artery   H/O: cesarean section   Ventricular aneurysm   Morbid [place] ([place]   Supervision of high risk pregnancy, antepartum   History of 2 cesarean sections   Encounter for care and examination of mother immediately after delivery  Additional problems: SCAD/MI    Discharge diagnosis: Term Pregnancy Delivered and CHTN                                              Post partum procedures: IV Venofer Augmentation: N/A Complications: [id number] Hospital course: Sceduled C/S   35y.o. yo G3P0202 at 376w5das admitted to the hospital 10/07/2020 for scheduled cesarean section with the following indication:Elective Repeat.Delivery details are as follows:  Membrane Rupture Time/Date: 4:02 PM ,10/07/2020   Delivery Method:C-Section, Low Transverse  Details of operation can be found in separate operative note.  Patient had an uncomplicated postpartum course.  She is ambulating, tolerating a regular diet, passing flatus, and urinating well. Patient is discharged home in stable condition on  10/09/20        Newborn Data: Birth date:10/07/2020  Birth time:4:10 PM  Gender:Female  Living status:Living  Apgars:8 ,9  Weight:4160 g     Magnesium Sulfate received: No BMZ received: No Rhophylac:No MMR:N/A T-DaP:Given prenatally Flu: N/A  Physical exam  Vitals:   10/08/20 0810 10/08/20 1429 10/08/20 2010 10/09/20 0553  BP: 125/89 112/69 131/83 110/75   Pulse: 88 89 93 83  Resp: '17  18 17  ' Temp: 98 F (36.7 C)  98.4 F (36.9 C) 98.3 F (36.8 C)  TempSrc: Oral  Oral Oral  SpO2:   100% 97%  Weight:      Height:       General: alert, cooperative, and no distress Lochia: appropriate Uterine Fundus: firm Incision: Dressing is clean, dry, and intact DVT Evaluation: No evidence of DVT seen on physical exam. Labs: Lab Results  Component Value Date   WBC 15.3 (H) 10/08/2020   HGB 8.9 (L) 10/08/2020   HCT 26.1 (L) 10/08/2020   MCV 88.8 10/08/2020   PLT 158 10/08/2020   CMP Latest Ref Rng & Units 09/19/2020  Glucose 70 - 99 mg/dL 129(H)  BUN 6 - 20 mg/dL 14  Creatinine 0.44 - 1.00 mg/dL 0.76  Sodium 135 - 145 mmol/L 137  Potassium 3.5 - 5.1 mmol/L 3.8  Chloride 98 - 111 mmol/L 108  CO2 22 - 32 mmol/L 19(L)  Calcium 8.9 - 10.3 mg/dL 9.1  Total Protein 6.5 - 8.1 g/dL 6.8  Total Bilirubin 0.3 - 1.2 mg/dL 1.4(H)  Alkaline Phos 38 - 126 U/L 86  AST 15 - 41 U/L 19  ALT 0 - 44 U/L 21   Edinburgh Score: Edinburgh Postnatal Depression Scale Screening Tool 10/08/2020  I have been able to laugh and see the funny side of things. 0  I have looked forward with enjoyment  to things. 0  I have blamed myself unnecessarily when things went wrong. 0  I have been anxious or worried for no good reason. 0  I have felt scared or panicky for no good reason. 0  Things have been getting on top of me. 0  I have been so unhappy that I have had difficulty sleeping. 0  I have felt sad or miserable. 0  I have been so unhappy that I have been crying. 0  The thought of harming myself has occurred to me. 0  Edinburgh Postnatal Depression Scale Total 0     After visit meds:  Allergies as of 10/09/2020   No Known Allergies      Medication List     STOP taking these medications    aspirin 81 MG tablet   cetirizine 10 MG tablet Commonly known as: ZYRTEC   labetalol 100 MG tablet Commonly known as: NORMODYNE   metFORMIN 500 MG tablet Commonly  known as: GLUCOPHAGE   multivitamin-prenatal 27-0.8 MG Tabs tablet       TAKE these medications    enalapril 10 MG tablet Commonly known as: VASOTEC Take 1 tablet (10 mg total) by mouth daily.   ibuprofen 600 MG tablet Commonly known as: ADVIL Take 1 tablet (600 mg total) by mouth every 6 (six) hours.   oxyCODONE-acetaminophen 5-325 MG tablet Commonly known as: PERCOCET/ROXICET Take 1-2 tablets by mouth every 6 (six) hours as needed for severe pain.         Discharge home in stable condition Infant Feeding: Bottle and Breast Infant Disposition:home with mother Discharge instruction: per After Visit Summary and Postpartum booklet. Activity: Advance as tolerated. Pelvic rest for 6 weeks.  Diet: routine diet Future Appointments: Future Appointments  Date Time Provider [place]  11/11/2020  8:10 AM Guss Bunde, MD [facility]   Follow up Visit:  [facility] [place] [facility] [place]. Go in 1 week(s).   Specialty: Obstetrics and Gynecology Why: For BP check and dressing check Contact information: [place], [address] [place] [phone]                 Message sent by Dr. Higinio Plan to [place] on 7/25 at 1730.   Please schedule this patient for a In person postpartum visit in 6 weeks with the following provider: MD. Additional Postpartum F/U:BP check 1 week  High risk pregnancy complicated by: HTN and Cardiac history (previous SCAD/MI) Delivery mode:  C-Section, Low Transverse  Anticipated Birth Control:  PP IUD placed   10/09/2020 Hansel Feinstein, CNM

## 2020-10-08 ENCOUNTER — Encounter (HOSPITAL_COMMUNITY): Payer: Self-pay | Admitting: Family Medicine

## 2020-10-08 LAB — CBC
HCT: 26.1 % — ABNORMAL LOW (ref 36.0–46.0)
Hemoglobin: 8.9 g/dL — ABNORMAL LOW (ref 12.0–15.0)
MCH: 30.3 pg (ref 26.0–34.0)
MCHC: 34.1 g/dL (ref 30.0–36.0)
MCV: 88.8 fL (ref 80.0–100.0)
Platelets: 158 10*3/uL (ref 150–400)
RBC: 2.94 MIL/uL — ABNORMAL LOW (ref 3.87–5.11)
RDW: 15.2 % (ref 11.5–15.5)
WBC: 15.3 10*3/uL — ABNORMAL HIGH (ref 4.0–10.5)
nRBC: 0 % (ref 0.0–0.2)

## 2020-10-08 LAB — BIRTH TISSUE RECOVERY COLLECTION (PLACENTA DONATION)

## 2020-10-08 MED ORDER — ASCORBIC ACID 500 MG PO TABS
250.0000 mg | ORAL_TABLET | ORAL | Status: DC
  Administered 2020-10-08: 250 mg via ORAL
  Filled 2020-10-08: qty 1

## 2020-10-08 MED ORDER — FERROUS SULFATE 325 (65 FE) MG PO TABS
325.0000 mg | ORAL_TABLET | ORAL | Status: DC
  Administered 2020-10-08: 325 mg via ORAL
  Filled 2020-10-08: qty 1

## 2020-10-08 MED ORDER — SODIUM CHLORIDE 0.9 % IV SOLN
500.0000 mg | Freq: Once | INTRAVENOUS | Status: AC
  Administered 2020-10-08: 500 mg via INTRAVENOUS
  Filled 2020-10-08: qty 25

## 2020-10-08 NOTE — Lactation Note (Signed)
This note was copied from a baby's chart. Lactation Consultation Note  Patient Name: Carol Wright [id number] Date: 10/08/2020 Reason for consult: Initial assessment;Late-preterm 34-36.6wks Age:37 hours   P3 mother whose infant is now 60 hours old.  This is an ETI at 38+5 weeks.  Mother did not breast feed her first child but breast fed and supplemented her second child. Mother is hoping to exclusively breast feed this baby.  Baby was swaddled and asleep when I arrived.  Mother was happy to report that he has been latching and feeding well since birth.  Encouraged to feed 8-12 times/24 hours or sooner if baby shows cues.  Reviewed feeding cues.    Offered to assist/observe a feeding if desired.  Mother is currently receiving an iron infusion.  She will call as needed.  Mom made aware of O/P services, breastfeeding support groups, community resources, and our phone # for post-discharge questions.  Mother has a DEBP for home use.  Father present and supportive.   Maternal Data Has patient been taught Hand Expression?: Yes Does the patient have breastfeeding experience prior to this delivery?: Yes How long did the patient breastfeed?: Did not breast feed her first child but breast fed and supplemented the second child  Feeding Mother's Current Feeding Choice: Breast Milk  LATCH Score                    Lactation Tools Discussed/Used    Interventions    Discharge Pump: Personal  Consult Status Consult Status: Follow-up Date: 10/09/20 Follow-up type: In-patient    Carol Wright 10/08/2020, 1:47 PM

## 2020-10-08 NOTE — Social Work (Signed)
MOB was referred for history of depression and anxiety.   * Referral screened out by Clinical Social Worker because none of the following criteria appear to apply:  ~ History of anxiety/depression during this pregnancy, or of post-partum depression following prior delivery. No prenatal concerns noted. ~ Diagnosis of anxiety and/or depression within last 3 years. Per chart review, MOB diagnosed in 2015. OR * MOB's symptoms currently being treated with medication and/or therapy.  Please contact the Clinical Social Worker if needs arise or by [facility] request.  MOB scored an 8 on the [place] Postpartum Depression Screen.  Darra Lis, [place] Work [employer]  [phone]

## 2020-10-08 NOTE — Anesthesia Postprocedure Evaluation (Signed)
Anesthesia Post Note  Patient: Carol Wright  Procedure(s) Performed: [place]     Patient location during evaluation: PACU Anesthesia Type: Combined Spinal/Epidural Level of consciousness: awake and alert and oriented Pain management: pain level controlled Vital Signs Assessment: post-procedure vital signs reviewed and stable Respiratory status: spontaneous breathing, nonlabored ventilation and respiratory function stable Cardiovascular status: blood pressure returned to baseline Postop Assessment: no apparent nausea or vomiting, spinal receding, no headache and no backache Anesthetic complications: no   No notable events documented.             Brennan Bailey

## 2020-10-08 NOTE — Progress Notes (Addendum)
POSTPARTUM PROGRESS NOTE  Subjective: Carol Wright is a 35 y.o. [id number] s/p POD#1LTCS at [redacted]w[redacted]d  She reports she doing well. No acute events overnight. She denies any problems with po intake. Denies nausea or vomiting. She has passed flatus. Pain is well controlled.  Lochia is appropriate. No apparent chest pain or SOB. Pt just had foley removed.   Objective: Blood pressure 125/89, pulse 88, temperature 98 F (36.7 C), temperature source Oral, resp. rate 17, height '5\' 3"'$  (1.6 m), weight 109.4 kg, last menstrual period 01/10/2020, SpO2 97 %, unknown if currently breastfeeding.  Physical Exam:  General: alert, cooperative and no distress Chest: no respiratory distress Abdomen: soft, non-tender  Uterine Fundus: firm and at level of umbilicus Extremities: No calf swelling or tenderness  No edema  Recent Labs    10/08/20 0618  HGB 8.9*  HCT 26.1*    Assessment/Plan: Carol Dunstonis a 35y.o. [id number]/p POD#1LTCS at 324w5d Routine Postpartum Care: Doing well, pain well-controlled.  -- Continue routine care, lactation support  -- Contraception: PP IUD -- Feeding: both -- cHTN: BP: 98/71, on labetolol 100 q8, pt is asymptomatic. Different BP med options were discussed if her BP rises -- Hgb 11.4>8.9--asymptomatic, IV venofer ordered -- Hx of SCAD/MI: Monitor for CP or SOB and BP.  -- Seen by SW for anxiety/depression  Dispo: Plan for discharge POD#2 or 3.  Carol EmeryMDWhitewateror WoEh[facility]althcare 10/08/2020 10:29 AM

## 2020-10-09 ENCOUNTER — Other Ambulatory Visit (HOSPITAL_COMMUNITY): Payer: Self-pay

## 2020-10-09 MED ORDER — OXYCODONE-ACETAMINOPHEN 5-325 MG PO TABS
1.0000 | ORAL_TABLET | Freq: Four times a day (QID) | ORAL | 0 refills | Status: DC | PRN
  Filled 2020-10-09: qty 15, 2d supply, fill #0

## 2020-10-09 MED ORDER — ENALAPRIL MALEATE 10 MG PO TABS
10.0000 mg | ORAL_TABLET | Freq: Every day | ORAL | 11 refills | Status: DC
  Filled 2020-10-09: qty 30, 30d supply, fill #0

## 2020-10-09 MED ORDER — IBUPROFEN 600 MG PO TABS
600.0000 mg | ORAL_TABLET | Freq: Four times a day (QID) | ORAL | 0 refills | Status: DC
  Filled 2020-10-09: qty 30, 8d supply, fill #0

## 2020-10-09 NOTE — Lactation Note (Signed)
This note was copied from a baby's chart. Lactation Consultation Note  Patient Name: Carol Wright [id number] Date: 10/09/2020 Reason for consult: Follow-up assessment Age:37 hours  Nipples are intact; Mom affirms that her breasts feel heavier today. Mom feels that breastfeeding is going well. I discussed with parents that Mom has the potential to make more milk with this child than the last. Mom doesn't feel like I need to observe a subsequent latch; Mom knows how to reach [place] once she goes home if she has any questions.  Matthias Hughs [facility] 10/09/2020, 11:25 AM

## 2020-10-10 ENCOUNTER — Telehealth: Payer: Self-pay

## 2020-10-10 NOTE — Telephone Encounter (Signed)
Transition Care Management Follow-up Telephone Call Date of discharge and from where: 10/09/2020-[facility] How have you been since you were released from the hospital? Patient stated she is doing good.  Any questions or concerns? No  Items Reviewed: Did the pt receive and understand the discharge instructions provided? Yes  Medications obtained and verified? Yes  Other? No  Any new allergies since your discharge? No  Dietary orders reviewed? N/A Do you have support at home? Yes   Home Care and Equipment/Supplies: Were home health services ordered? not applicable If so, what is the name of the agency? N/A  Has the agency set up a time to come to the patient's home? not applicable Were any new equipment or medical supplies ordered?  No What is the name of the medical supply agency? N/A Were you able to get the supplies/equipment? not applicable Do you have any questions related to the use of the equipment or supplies? No  Functional Questionnaire: (I = Independent and D = Dependent) ADLs: I  Bathing/Dressing- I  Meal Prep- I  Eating- I  Maintaining continence- I  Transferring/Ambulation- I  Managing Meds- I  Follow up appointments reviewed:  PCP Hospital f/u appt confirmed? No  . [facility] f/u appt confirmed? Yes  Scheduled to see Dr. Gala Romney on 11/11/2020 @ 8:10 am. Are transportation arrangements needed? No  If their condition worsens, is the pt aware to call PCP or go to the Emergency Dept.? Yes Was the patient provided with contact information for the PCP's office or ED? Yes Was to pt encouraged to call back with questions or concerns? Yes

## 2020-10-14 ENCOUNTER — Other Ambulatory Visit: Payer: Self-pay | Admitting: Obstetrics & Gynecology

## 2020-10-14 ENCOUNTER — Other Ambulatory Visit: Payer: Self-pay

## 2020-10-14 ENCOUNTER — Ambulatory Visit (HOSPITAL_COMMUNITY)
Admission: RE | Admit: 2020-10-14 | Discharge: 2020-10-14 | Disposition: A | Discharge status: Home / Self Care | Payer: Medicaid Other | Source: Ambulatory Visit | Attending: Obstetrics & Gynecology | Admitting: Obstetrics & Gynecology

## 2020-10-14 ENCOUNTER — Ambulatory Visit (INDEPENDENT_AMBULATORY_CARE_PROVIDER_SITE_OTHER): Payer: Medicaid Other | Admitting: Obstetrics & Gynecology

## 2020-10-14 VITALS — BP 140/91 | HR 91 | Ht 64.0 in | Wt 233.0 lb

## 2020-10-14 DIAGNOSIS — M7989 Other specified soft tissue disorders: Secondary | ICD-10-CM

## 2020-10-14 DIAGNOSIS — I10 Essential (primary) hypertension: Secondary | ICD-10-CM

## 2020-10-14 DIAGNOSIS — R2233 Localized swelling, mass and lump, upper limb, bilateral: Secondary | ICD-10-CM

## 2020-10-14 DIAGNOSIS — I252 Old myocardial infarction: Secondary | ICD-10-CM

## 2020-10-14 NOTE — Progress Notes (Signed)
   Subjective:    Patient ID: Carol Wright, female    DOB: [birth year], 35 y.o.   MRN: [id number]  HPI  35 year old female with presents 1 week postpartum with complaints of right lower extremity swelling greater than left.  She also complains of pain in bilateral axilla when applying deodorant.  She also feels masses.  Patient had some chills to days ago and none currently.  No fever.  Breasts are nontender.  Patient has no issues with her wound.  Patient denies headache scotomata or right upper quadrant pain.  Review of Systems  Constitutional:  Positive for chills.       Chills 2 days ago  Respiratory: Negative.    Cardiovascular:  Positive for leg swelling.  Gastrointestinal: Negative.        Right > left  Genitourinary: Negative.   Neurological:  Negative for dizziness.  Psychiatric/Behavioral: Negative.        Objective:   Physical Exam Vitals reviewed.  Constitutional:      General: She is not in acute distress.    Appearance: She is well-developed.  HENT:     Head: Normocephalic and atraumatic.  Eyes:     Conjunctiva/sclera: Conjunctivae normal.  Cardiovascular:     Rate and Rhythm: Normal rate.  Pulmonary:     Effort: Pulmonary effort is normal.  Genitourinary:    Comments: Bilateral axilla has what appears to be a lymph node enlargement several centimeters. Musculoskeletal:     Comments: Bilateral swelling with right > left (5 cm bigger).  Negative homans, no redness  Skin:    General: Skin is warm and dry.  Neurological:     Mental Status: She is alert and oriented to person, place, and time.  Psychiatric:        Mood and Affect: Mood normal.  Vitals:   10/14/20 0954  BP: (!) 140/91  Pulse: 91  Weight: 233 lb (105.7 kg)  Height: '5\' 4"'$  (1.626 m)      Assessment & Plan:  35 year old female with history of coronary artery dissection and chronic hypertension presents with right lower extremity swelling as well as bilateral axilla swelling.  Rule out DVT  with lower extremity Doppler No evidence of mastitis currently.  Will order bilateral axillary ultrasounds for evaluation of galactocele's or enlarged lymph nodes. BP slightly elevated.  Patient on enalapril.  She will call us if it continues to be elevated 140/90.  No evidence of preeclampsia today. Wound healing well.

## 2020-10-14 NOTE — Progress Notes (Signed)
Pt here for BP check one week after delivery. Pt concerned about swelling in ankles and feet and pt feels lumps in her armpits. BP 140/91

## 2020-10-19 ENCOUNTER — Telehealth (HOSPITAL_COMMUNITY): Payer: Self-pay

## 2020-10-19 ENCOUNTER — Other Ambulatory Visit: Payer: Medicaid Other

## 2020-10-19 NOTE — Telephone Encounter (Signed)
"  I'm doing well. I'm good." Patient declines questions or concerns about her healing.   "He's perfect. He is eating non stop and keeping [place] up throughout the night. He is doing well. He sleeps in our room in his bassinet." RN reviewed ABC's of safe sleep with patient. Patient declines any questions or concerns about baby.  EPDS score is 1.  Sharyn Lull [facility] 10/19/2020,0927

## 2020-11-08 DIAGNOSIS — J301 Allergic rhinitis due to pollen: Secondary | ICD-10-CM | POA: Diagnosis not present

## 2020-11-08 DIAGNOSIS — J3089 Other allergic rhinitis: Secondary | ICD-10-CM | POA: Diagnosis not present

## 2020-11-08 DIAGNOSIS — J3081 Allergic rhinitis due to animal (cat) (dog) hair and dander: Secondary | ICD-10-CM | POA: Diagnosis not present

## 2020-11-08 NOTE — Progress Notes (Signed)
[place] Partum Visit Note  Carol Wright is a 35 y.o. [phone] female who presents for a postpartum visit. She is 5 weeks postpartum following a repeat cesarean section.  I have fully reviewed the prenatal and intrapartum course. The delivery was at 38.5 gestational weeks.  Anesthesia: epidural. Postpartum course has been unremarkable. Baby is doing well. Baby is feeding by breast. Bleeding no bleeding. Bowel function is normal. Bladder function is normal. Patient is sexually active. Contraception method is IUD-placed in hospital Postpartum depression screening: positive (score 14).  Patient states she struggles with mild depression frequently.  She was also treated with an antidepressant after one of her other pregnancies.  Patient denies suicidal ideation.  She also has an issue with low libido and difficulty achieving orgasm.  This can be exacerbated with Zoloft.  She will let us know if this occurs.  I also suggested cognitive behavioral therapy  The pregnancy intention screening data noted above was reviewed. Potential methods of contraception were discussed. The patient elected to proceed with No data recorded.    Health Maintenance Due  Topic Date Due   INFLUENZA VACCINE  10/14/2020    The following portions of the patient's history were reviewed and updated as appropriate: allergies, current medications, past family history, past medical history, past social history, past surgical history, and problem list.  Review of Systems Pertinent items noted in HPI and remainder of comprehensive ROS otherwise negative.  Objective:  LMP 01/10/2020    General:  alert, cooperative, and no distress   Breasts:  normal  Lungs: clear to auscultation bilaterally  Heart:  regular rate and rhythm  Abdomen: soft, non-tender; bowel sounds normal; no masses,  no organomegaly   Wound well approximated incision  GU exam:   IUD strings not seen, moderate amount of discharge, atrophic vagina.        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  Mood and well being: Patient with positive depression screening today. Reviewed local resources for support.  We will start Zoloft 25 mg for 3 days and then increase to 50 mg. - Patient tobacco use? No.   - hx of drug use? No.    2. Infant care and feeding:  -Patient currently breastmilk feeding? Yes. Reviewed importance of draining breast regularly to support lactation.  -Social determinants of health (SDOH) reviewed in EPIC. No concerns  3. Sexuality, contraception and birth spacing - Patient does not want a pregnancy in the next year.  Desired family size is 3 children.  - Reviewed forms of contraception in tiered fashion. Patient desired IUD today.   - Discussed birth spacing of 18 months- Vagifem for atrophic vaginitis due to exclusive breast feeding -Read book Come As You Are  4. Sleep and fatigue -Encouraged family/partner/community support of 4 hrs of uninterrupted sleep to help with mood and fatigue  5. Physical Recovery  - Discussed patients delivery and complications. She describes her c/s as good. - Patient had a C-section repeat; no problems after deliver.  - Patient has urinary incontinence? No. - Patient is safe to resume physical and sexual activity  6.  Health Maintenance - HM due items addressed Yes - Last pap smear  Diagnosis  Date Value Ref Range Status  11/27/2019   Final   - Negative for intraepithelial lesion or malignancy (NILM)   Pap smear not done at today's visit.  -Breast Cancer  screening indicated? No.   7. Chronic Disease/Pregnancy Condition follow up:  cardiac disease; BP has been mildly elevated.  Her goal is less than 130/80.  We will increase her enalapril to 20 mg.  She is also going to follow-up with her cardiologist for further recommendations.  If her blood pressure drops  after increasing the enalapril, she will call us and her cardiologist.  - PCP follow up  Silas [place], [place], [place]

## 2020-11-11 ENCOUNTER — Other Ambulatory Visit (HOSPITAL_COMMUNITY): Payer: Self-pay

## 2020-11-11 ENCOUNTER — Encounter: Payer: Self-pay | Admitting: Physician Assistant

## 2020-11-11 ENCOUNTER — Ambulatory Visit (INDEPENDENT_AMBULATORY_CARE_PROVIDER_SITE_OTHER): Payer: Medicaid Other | Admitting: Obstetrics & Gynecology

## 2020-11-11 ENCOUNTER — Ambulatory Visit (INDEPENDENT_AMBULATORY_CARE_PROVIDER_SITE_OTHER): Payer: Medicaid Other | Admitting: Physician Assistant

## 2020-11-11 ENCOUNTER — Encounter: Payer: Self-pay | Admitting: Obstetrics & Gynecology

## 2020-11-11 ENCOUNTER — Other Ambulatory Visit (HOSPITAL_COMMUNITY)
Admission: RE | Admit: 2020-11-11 | Discharge: 2020-11-11 | Disposition: A | Discharge status: Home / Self Care | Payer: Medicaid Other | Source: Ambulatory Visit | Attending: Obstetrics & Gynecology | Admitting: Obstetrics & Gynecology

## 2020-11-11 ENCOUNTER — Other Ambulatory Visit: Payer: Self-pay

## 2020-11-11 VITALS — BP 149/84 | HR 84 | Ht 63.0 in | Wt 216.0 lb

## 2020-11-11 VITALS — BP 159/87 | HR 78 | Resp 16 | Ht 63.0 in | Wt 216.0 lb

## 2020-11-11 DIAGNOSIS — I252 Old myocardial infarction: Secondary | ICD-10-CM

## 2020-11-11 DIAGNOSIS — Z6838 Body mass index (BMI) 38.0-38.9, adult: Secondary | ICD-10-CM

## 2020-11-11 DIAGNOSIS — I10 Essential (primary) hypertension: Secondary | ICD-10-CM

## 2020-11-11 DIAGNOSIS — J3081 Allergic rhinitis due to animal (cat) (dog) hair and dander: Secondary | ICD-10-CM | POA: Diagnosis not present

## 2020-11-11 DIAGNOSIS — O99345 Other mental disorders complicating the puerperium: Secondary | ICD-10-CM | POA: Diagnosis not present

## 2020-11-11 DIAGNOSIS — R5383 Other fatigue: Secondary | ICD-10-CM | POA: Diagnosis not present

## 2020-11-11 DIAGNOSIS — R6882 Decreased libido: Secondary | ICD-10-CM

## 2020-11-11 DIAGNOSIS — F53 Postpartum depression: Secondary | ICD-10-CM

## 2020-11-11 DIAGNOSIS — R239 Unspecified skin changes: Secondary | ICD-10-CM | POA: Insufficient documentation

## 2020-11-11 DIAGNOSIS — N898 Other specified noninflammatory disorders of vagina: Secondary | ICD-10-CM | POA: Insufficient documentation

## 2020-11-11 DIAGNOSIS — Z0000 Encounter for general adult medical examination without abnormal findings: Secondary | ICD-10-CM

## 2020-11-11 DIAGNOSIS — E559 Vitamin D deficiency, unspecified: Secondary | ICD-10-CM | POA: Diagnosis not present

## 2020-11-11 DIAGNOSIS — J3089 Other allergic rhinitis: Secondary | ICD-10-CM | POA: Diagnosis not present

## 2020-11-11 DIAGNOSIS — J301 Allergic rhinitis due to pollen: Secondary | ICD-10-CM | POA: Diagnosis not present

## 2020-11-11 DIAGNOSIS — Z83511 Family history of glaucoma: Secondary | ICD-10-CM | POA: Diagnosis not present

## 2020-11-11 MED ORDER — ESTRADIOL 10 MCG VA TABS: ORAL_TABLET | VAGINAL | 3 refills | Status: DC

## 2020-11-11 MED ORDER — ENALAPRIL MALEATE 20 MG PO TABS: 20.0000 mg | ORAL_TABLET | Freq: Every day | ORAL | 1 refills | Status: DC

## 2020-11-11 MED ORDER — SERTRALINE HCL 25 MG PO TABS: ORAL_TABLET | ORAL | 5 refills | Status: DC

## 2020-11-11 NOTE — Patient Instructions (Signed)
Health Maintenance, Female Adopting a healthy lifestyle and getting preventive care are important in promoting health and wellness. Ask your health care provider about: The right schedule for you to have regular tests and exams. Things you can do on your own to prevent diseases and keep yourself healthy. What should I know about diet, weight, and exercise? Eat a healthy diet  Eat a diet that includes plenty of vegetables, fruits, low-fat dairy products, and lean protein. Do not eat a lot of foods that are high in solid fats, added sugars, or sodium.  Maintain a healthy weight Body mass index (BMI) is used to identify weight problems. It estimates body fat based on height and weight. Your health care provider can help determineyour BMI and help you achieve or maintain a healthy weight. Get regular exercise Get regular exercise. This is one of the most important things you can do for your health. Most adults should: Exercise for at least 150 minutes each week. The exercise should increase your heart rate and make you sweat (moderate-intensity exercise). Do strengthening exercises at least twice a week. This is in addition to the moderate-intensity exercise. Spend less time sitting. Even light physical activity can be beneficial. Watch cholesterol and blood lipids Have your blood tested for lipids and cholesterol at 37 years of age, then havethis test every 5 years. Have your cholesterol levels checked more often if: Your lipid or cholesterol levels are high. You are older than 37 years of age. You are at high risk for heart disease. What should I know about cancer screening? Depending on your health history and family history, you may need to have cancer screening at various ages. This may include screening for: Breast cancer. Cervical cancer. Colorectal cancer. Skin cancer. Lung cancer. What should I know about heart disease, diabetes, and high blood pressure? Blood pressure and heart  disease High blood pressure causes heart disease and increases the risk of stroke. This is more likely to develop in people who have high blood pressure readings, are of African descent, or are overweight. Have your blood pressure checked: Every 3-5 years if you are 18-39 years of age. Every year if you are 40 years old or older. Diabetes Have regular diabetes screenings. This checks your fasting blood sugar level. Have the screening done: Once every three years after age 40 if you are at a normal weight and have a low risk for diabetes. More often and at a younger age if you are overweight or have a high risk for diabetes. What should I know about preventing infection? Hepatitis B If you have a higher risk for hepatitis B, you should be screened for this virus. Talk with your health care provider to find out if you are at risk forhepatitis B infection. Hepatitis C Testing is recommended for: Everyone born from 1945 through 1965. Anyone with known risk factors for hepatitis C. Sexually transmitted infections (STIs) Get screened for STIs, including gonorrhea and chlamydia, if: You are sexually active and are younger than 37 years of age. You are older than 37 years of age and your health care provider tells you that you are at risk for this type of infection. Your sexual activity has changed since you were last screened, and you are at increased risk for chlamydia or gonorrhea. Ask your health care provider if you are at risk. Ask your health care provider about whether you are at high risk for HIV. Your health care provider may recommend a prescription medicine to help   prevent HIV infection. If you choose to take medicine to prevent HIV, you should first get tested for HIV. You should then be tested every 3 months for as long as you are taking the medicine. Pregnancy If you are about to stop having your period (premenopausal) and you may become pregnant, seek counseling before you get  pregnant. Take 400 to 800 micrograms (mcg) of folic acid every day if you become pregnant. Ask for birth control (contraception) if you want to prevent pregnancy. Osteoporosis and menopause Osteoporosis is a disease in which the bones lose minerals and strength with aging. This can result in bone fractures. If you are 65 years old or older, or if you are at risk for osteoporosis and fractures, ask your health care provider if you should: Be screened for bone loss. Take a calcium or vitamin D supplement to lower your risk of fractures. Be given hormone replacement therapy (HRT) to treat symptoms of menopause. Follow these instructions at home: Lifestyle Do not use any products that contain nicotine or tobacco, such as cigarettes, e-cigarettes, and chewing tobacco. If you need help quitting, ask your health care provider. Do not use street drugs. Do not share needles. Ask your health care provider for help if you need support or information about quitting drugs. Alcohol use Do not drink alcohol if: Your health care provider tells you not to drink. You are pregnant, may be pregnant, or are planning to become pregnant. If you drink alcohol: Limit how much you use to 0-1 drink a day. Limit intake if you are breastfeeding. Be aware of how much alcohol is in your drink. In the [place] one drink equals one 12 oz bottle of beer (355 mL), one 5 oz glass of wine (148 mL), or one 1 oz glass of hard liquor (44 mL). General instructions Schedule regular health, dental, and eye exams. Stay current with your vaccines. Tell your health care provider if: You often feel depressed. You have ever been abused or do not feel safe at home. Summary Adopting a healthy lifestyle and getting preventive care are important in promoting health and wellness. Follow your health care provider's instructions about healthy diet, exercising, and getting tested or screened for diseases. Follow your health care provider's  instructions on monitoring your cholesterol and blood pressure. This information is not intended to replace advice given to you by your health care provider. Make sure you discuss any questions you have with your healthcare provider. Document Revised: 02/23/2018 Document Reviewed: 02/23/2018 Elsevier Patient Education  2022 Elsevier Inc.  

## 2020-11-11 NOTE — Progress Notes (Signed)
Subjective:     Carol Wright is a 35 y.o. female and is here for a comprehensive physical exam. The patient reports  pt just had baby 1 month ago. She is overwhelmed and having some depression and anxiety. She does feel fatigued.  Mother is BF.   She request referral to opthalmology for eye exam due to her grandfather and glaucoma.   She has noticed some new skin changes and would like to see dermatology.    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  Tobacco Use   Smoking status: Never   Smokeless tobacco: Never  Vaping Use   Vaping Use: Never used  Substance and Sexual Activity   Alcohol use: No   Drug use: No   Sexual activity: Yes    Birth control/protection: None  Other Topics Concern   Not on file  Social History Narrative   Not on file   Social Determinants of Health   Financial Resource Strain: Not on file  Food Insecurity: No Food Insecurity   Worried About Running Out of Food in the Last Year: Never true   [place] in the Last Year: Never true  Transportation Needs: No Transportation Needs   Lack of Transportation (Medical): No   Lack of Transportation (Non-Medical): No  Physical Activity: Not on file  Stress: Not on file  Social Connections: Not on file  Intimate Partner Violence: Not on file   Health Maintenance  Topic Date Due   INFLUENZA VACCINE  06/13/2021 (Originally 10/14/2020)   PAP SMEAR-Modifier  11/27/2022   TETANUS/TDAP  09/03/2030   COVID-19 Vaccine  Completed   Hepatitis C Screening  Completed   HIV Screening  Completed   Pneumococcal Vaccine 70-58 Years old  Aged Out   HPV VACCINES  Aged Out    The following portions of the patient's history were reviewed and updated as appropriate: allergies, current medications, past family  history, past medical history, past social history, past surgical history, and problem list.  Review of Systems Pertinent items noted in HPI and remainder of comprehensive ROS otherwise negative.   Objective:    BP (!) 149/84   Pulse 84   Ht '5\' 3"'$  (1.6 m)   Wt 216 lb (98 kg)   LMP 01/10/2020   SpO2 99%   BMI 38.26 kg/m  General appearance: alert, cooperative, appears stated age, and moderately obese Head: Normocephalic, without obvious abnormality, atraumatic Eyes: conjunctivae/corneas clear. PERRL, EOM's intact. Fundi benign. Ears: normal TM's and external ear canals both ears Nose: Nares normal. Septum midline. Mucosa normal. No drainage or sinus tenderness. Throat: lips, mucosa, and tongue normal; teeth and gums normal Neck: no adenopathy, no carotid bruit, no JVD, supple, symmetrical, trachea midline, and thyroid not enlarged, symmetric, no tenderness/mass/nodules Back: symmetric, no curvature. ROM normal. No CVA tenderness. Lungs: clear to auscultation bilaterally Heart: regular rate and rhythm, S1, S2 normal, no murmur, click, rub or gallop Abdomen: soft, non-tender; bowel sounds normal; no masses,  no organomegaly Extremities: extremities normal, atraumatic, no cyanosis or edema Pulses: 2+ and symmetric Skin: new macular pigmented nevus on right side of face, left upper arms.  Lymph nodes: Cervical, supraclavicular, and axillary nodes normal. Neurologic: Alert and oriented X 3, normal strength  and tone. Normal symmetric reflexes. Normal coordination and gait   .Marland Kitchen Depression screen [facility] 2/9 11/11/2020 09/30/2020 09/12/2020 09/05/2020 11/13/2019  Decreased Interest 0 0 0 0 0  Down, Depressed, Hopeless 3 0 0 0 0  PHQ - 2 Score 3 0 0 0 0  Altered sleeping 3 0 0 1 0  Tired, decreased energy 1 1 0 1 1  Change in appetite 3 0 0 0 3  Feeling bad or failure about yourself  3 0 0 0 3  Trouble concentrating 3 0 0 0 0  Moving slowly or fidgety/restless 0 0 0 0 0  Suicidal thoughts 0 0  0 0 0  PHQ-9 Score 16 1 0 2 7  Difficult doing work/chores Somewhat difficult - - - Not difficult at all   .Marland Kitchen GAD 7 : Generalized Anxiety Score 11/11/2020 09/30/2020 09/12/2020 09/05/2020  Nervous, Anxious, on Edge 2 0 0 0  Control/stop worrying 3 0 0 0  Worry too much - different things 3 0 0 0  Trouble relaxing 2 0 0 0  Restless 0 0 0 0  Easily annoyed or irritable 3 0 0 0  Afraid - awful might happen 0 0 0 0  Total GAD 7 Score 13 0 0 0  Anxiety Difficulty Somewhat difficult - - -     Assessment:    Healthy female exam.     Plan:    Marland KitchenMarland KitchenEtheline was seen today for annual exam.  Diagnoses and all orders for this visit:  Routine physical examination -     Lipid Panel w/reflex Direct LDL -     COMPLETE METABOLIC PANEL WITH GFR -     CBC with Differential/Platelet -     Fe+TIBC+Fer -     TSH -     VITAMIN D 25 Hydroxy (Vit-D Deficiency, Fractures) -     B12 and Folate Panel  Family history of glaucoma in grandfather -     Ambulatory referral to Ophthalmology  No energy -     COMPLETE METABOLIC PANEL WITH GFR -     CBC with Differential/Platelet -     Fe+TIBC+Fer -     TSH -     VITAMIN D 25 Hydroxy (Vit-D Deficiency, Fractures) -     B12 and Folate Panel  Recent skin changes -     Ambulatory referral to Dermatology  Post-partum depression  Class 2 severe obesity due to excess calories with serious comorbidity and body mass index (BMI) of 38.0 to 38.9 in adult ([place])  .Marland Kitchen Discussed 150 minutes of exercise a week.  Encouraged vitamin D 1000 units and Calcium '1300mg'$  or 4 servings of dairy a day.  Fasting labs ordered.  Labs included for fatigue but discuss fatigue from just having a baby and mood changes.  OB just added zoloft which should help. PHQ/GAD numbers not to goal.  Referral for eye doctor made.  Referral for derm made. Discussed skin changes look benign like SK and aging spots.  BP not to goal. OB just increased her ACE to '20mg'$ .    See After Visit  Summary for Counseling Recommendations

## 2020-11-12 DIAGNOSIS — J3081 Allergic rhinitis due to animal (cat) (dog) hair and dander: Secondary | ICD-10-CM | POA: Diagnosis not present

## 2020-11-12 DIAGNOSIS — J301 Allergic rhinitis due to pollen: Secondary | ICD-10-CM | POA: Diagnosis not present

## 2020-11-12 DIAGNOSIS — J3089 Other allergic rhinitis: Secondary | ICD-10-CM | POA: Diagnosis not present

## 2020-11-12 LAB — COMPLETE METABOLIC PANEL WITH GFR
AG Ratio: 1.6 (calc) (ref 1.0–2.5)
ALT: 12 U/L (ref 6–29)
AST: 11 U/L (ref 10–30)
Albumin: 4.5 g/dL (ref 3.6–5.1)
Alkaline phosphatase (APISO): 72 U/L (ref 31–125)
BUN: 12 mg/dL (ref 7–25)
CO2: 27 mmol/L (ref 20–32)
Calcium: 9.5 mg/dL (ref 8.6–10.2)
Chloride: 106 mmol/L (ref 98–110)
Creat: 0.74 mg/dL (ref 0.50–0.97)
Globulin: 2.9 g/dL (calc) (ref 1.9–3.7)
Glucose, Bld: 89 mg/dL (ref 65–99)
Potassium: 4 mmol/L (ref 3.5–5.3)
Sodium: 142 mmol/L (ref 135–146)
Total Bilirubin: 1.6 mg/dL — ABNORMAL HIGH (ref 0.2–1.2)
Total Protein: 7.4 g/dL (ref 6.1–8.1)
eGFR: 107 mL/min/{1.73_m2} (ref >=60)

## 2020-11-12 LAB — CBC WITH DIFFERENTIAL/PLATELET
Absolute Monocytes: 503 cells/uL (ref 200–950)
Basophils Absolute: 41 cells/uL (ref 0–200)
Basophils Relative: 0.6 %
Eosinophils Absolute: 218 cells/uL (ref 15–500)
Eosinophils Relative: 3.2 %
HCT: 38.1 % (ref 35.0–45.0)
Hemoglobin: 12.5 g/dL (ref 11.7–15.5)
Lymphs Abs: 1693 cells/uL (ref 850–3900)
MCH: 28.3 pg (ref 27.0–33.0)
MCHC: 32.8 g/dL (ref 32.0–36.0)
MCV: 86.4 fL (ref 80.0–100.0)
MPV: 11.7 fL (ref 7.5–12.5)
Monocytes Relative: 7.4 %
Neutro Abs: 4345 cells/uL (ref 1500–7800)
Neutrophils Relative %: 63.9 %
Platelets: 194 10*3/uL (ref 140–400)
RBC: 4.41 10*6/uL (ref 3.80–5.10)
RDW: 13.5 % (ref 11.0–15.0)
Total Lymphocyte: 24.9 %
WBC: 6.8 10*3/uL (ref 3.8–10.8)

## 2020-11-12 LAB — LIPID PANEL W/REFLEX DIRECT LDL
Cholesterol: 189 mg/dL (ref <200)
HDL: 45 mg/dL — ABNORMAL LOW (ref >=50)
LDL Cholesterol (Calc): 128 mg/dL (calc) — ABNORMAL HIGH
Non-HDL Cholesterol (Calc): 144 mg/dL (calc) — ABNORMAL HIGH (ref <130)
Total CHOL/HDL Ratio: 4.2 (calc) (ref <5.0)
Triglycerides: 66 mg/dL (ref <150)

## 2020-11-12 LAB — CERVICOVAGINAL ANCILLARY ONLY
Bacterial Vaginitis (gardnerella): NEGATIVE
Candida Glabrata: NEGATIVE
Candida Vaginitis: NEGATIVE
Comment: NEGATIVE
Comment: NEGATIVE
Comment: NEGATIVE

## 2020-11-12 LAB — B12 AND FOLATE PANEL
Folate: >24 ng/mL
Vitamin B-12: 396 pg/mL (ref 200–1100)

## 2020-11-12 LAB — IRON,TIBC AND FERRITIN PANEL
%SAT: 15 % (calc) — ABNORMAL LOW (ref 16–45)
Ferritin: 54 ng/mL (ref 16–154)
Iron: 53 ug/dL (ref 40–190)
TIBC: 364 mcg/dL (calc) (ref 250–450)

## 2020-11-12 LAB — TSH: TSH: 0.94 mIU/L

## 2020-11-12 LAB — VITAMIN D 25 HYDROXY (VIT D DEFICIENCY, FRACTURES): Vit D, 25-Hydroxy: 35 ng/mL (ref 30–100)

## 2020-11-12 NOTE — Progress Notes (Signed)
[place],   B12 on low side of normal. Start b12 1058mg daily.  Vitamin D normal. Make sure taking at least 1000units daily.  Thyroid normal.  Hemoglobin has come back up. Serum iron is normal but low. Staying on pre-natals should help this. You are only 1 month after delivery. It can take 3 months to completely get back to baseline after delivery.  LDL/HDL not quite to goal but can work on this with diet and exercise.

## 2020-11-25 DIAGNOSIS — I10 Essential (primary) hypertension: Secondary | ICD-10-CM | POA: Diagnosis not present

## 2020-11-25 DIAGNOSIS — I2510 Atherosclerotic heart disease of native coronary artery without angina pectoris: Secondary | ICD-10-CM | POA: Diagnosis not present

## 2020-12-09 ENCOUNTER — Other Ambulatory Visit: Payer: Medicaid Other

## 2020-12-09 ENCOUNTER — Inpatient Hospital Stay: Admission: RE | Admit: 2020-12-09 | Payer: Medicaid Other | Source: Ambulatory Visit

## 2020-12-09 DIAGNOSIS — J301 Allergic rhinitis due to pollen: Secondary | ICD-10-CM | POA: Diagnosis not present

## 2020-12-09 DIAGNOSIS — J3081 Allergic rhinitis due to animal (cat) (dog) hair and dander: Secondary | ICD-10-CM | POA: Diagnosis not present

## 2020-12-09 DIAGNOSIS — J3089 Other allergic rhinitis: Secondary | ICD-10-CM | POA: Diagnosis not present

## 2020-12-16 DIAGNOSIS — J3089 Other allergic rhinitis: Secondary | ICD-10-CM | POA: Diagnosis not present

## 2020-12-16 DIAGNOSIS — J301 Allergic rhinitis due to pollen: Secondary | ICD-10-CM | POA: Diagnosis not present

## 2020-12-16 DIAGNOSIS — J3081 Allergic rhinitis due to animal (cat) (dog) hair and dander: Secondary | ICD-10-CM | POA: Diagnosis not present

## 2020-12-23 DIAGNOSIS — J301 Allergic rhinitis due to pollen: Secondary | ICD-10-CM | POA: Diagnosis not present

## 2020-12-23 DIAGNOSIS — J3089 Other allergic rhinitis: Secondary | ICD-10-CM | POA: Diagnosis not present

## 2020-12-23 DIAGNOSIS — D225 Melanocytic nevi of trunk: Secondary | ICD-10-CM | POA: Diagnosis not present

## 2020-12-23 DIAGNOSIS — J3081 Allergic rhinitis due to animal (cat) (dog) hair and dander: Secondary | ICD-10-CM | POA: Diagnosis not present

## 2020-12-23 DIAGNOSIS — L821 Other seborrheic keratosis: Secondary | ICD-10-CM | POA: Diagnosis not present

## 2020-12-30 DIAGNOSIS — J301 Allergic rhinitis due to pollen: Secondary | ICD-10-CM | POA: Diagnosis not present

## 2020-12-30 DIAGNOSIS — J3081 Allergic rhinitis due to animal (cat) (dog) hair and dander: Secondary | ICD-10-CM | POA: Diagnosis not present

## 2020-12-30 DIAGNOSIS — J3089 Other allergic rhinitis: Secondary | ICD-10-CM | POA: Diagnosis not present

## 2021-01-06 DIAGNOSIS — J3081 Allergic rhinitis due to animal (cat) (dog) hair and dander: Secondary | ICD-10-CM | POA: Diagnosis not present

## 2021-01-06 DIAGNOSIS — J301 Allergic rhinitis due to pollen: Secondary | ICD-10-CM | POA: Diagnosis not present

## 2021-01-06 DIAGNOSIS — J3089 Other allergic rhinitis: Secondary | ICD-10-CM | POA: Diagnosis not present

## 2021-01-13 ENCOUNTER — Other Ambulatory Visit: Payer: Self-pay

## 2021-01-13 ENCOUNTER — Other Ambulatory Visit (HOSPITAL_COMMUNITY)
Admission: RE | Admit: 2021-01-13 | Discharge: 2021-01-13 | Disposition: A | Discharge status: Home / Self Care | Payer: Medicaid Other | Source: Ambulatory Visit | Attending: Obstetrics & Gynecology | Admitting: Obstetrics & Gynecology

## 2021-01-13 ENCOUNTER — Ambulatory Visit (INDEPENDENT_AMBULATORY_CARE_PROVIDER_SITE_OTHER): Payer: Medicaid Other | Admitting: Obstetrics & Gynecology

## 2021-01-13 ENCOUNTER — Encounter: Payer: Self-pay | Admitting: Obstetrics & Gynecology

## 2021-01-13 VITALS — BP 142/88 | HR 89 | Ht 63.0 in | Wt 224.0 lb

## 2021-01-13 DIAGNOSIS — Z30431 Encounter for routine checking of intrauterine contraceptive device: Secondary | ICD-10-CM | POA: Diagnosis not present

## 2021-01-13 DIAGNOSIS — T839XXA Unspecified complication of genitourinary prosthetic device, implant and graft, initial encounter: Secondary | ICD-10-CM

## 2021-01-13 DIAGNOSIS — J3089 Other allergic rhinitis: Secondary | ICD-10-CM | POA: Diagnosis not present

## 2021-01-13 DIAGNOSIS — N898 Other specified noninflammatory disorders of vagina: Secondary | ICD-10-CM | POA: Diagnosis not present

## 2021-01-13 DIAGNOSIS — J3081 Allergic rhinitis due to animal (cat) (dog) hair and dander: Secondary | ICD-10-CM | POA: Diagnosis not present

## 2021-01-13 DIAGNOSIS — J301 Allergic rhinitis due to pollen: Secondary | ICD-10-CM | POA: Diagnosis not present

## 2021-01-13 MED ORDER — MEDROXYPROGESTERONE ACETATE 10 MG PO TABS: 10.0000 mg | ORAL_TABLET | Freq: Every day | ORAL | 0 refills | Status: DC

## 2021-01-13 NOTE — Progress Notes (Signed)
   Subjective:    Patient ID: Carol Wright, female    DOB: [birth year], 35 y.o.   MRN: [id number]  HPI  35 year old female presents for IUD string check.  She had 1 placed post C-section.  Patient had no bleeding after a week and a half of delivery.  The first month was fine as well.  Lately she has been having bleeding every couple of days.  As well as after intercourse.  Patient denies pain with bleeding.  Patient is no longer breast-feeding.  Review of Systems  Constitutional: Negative.   Respiratory: Negative.    Cardiovascular: Negative.   Gastrointestinal: Negative.   Genitourinary:  Positive for menstrual problem and vaginal bleeding.      Objective:   Physical Exam Vitals reviewed.  Constitutional:      General: She is not in acute distress.    Appearance: She is well-developed.  HENT:     Head: Normocephalic and atraumatic.  Eyes:     Conjunctiva/sclera: Conjunctivae normal.  Cardiovascular:     Rate and Rhythm: Normal rate.  Pulmonary:     Effort: Pulmonary effort is normal.  Abdominal:     General: Abdomen is flat.     Palpations: Abdomen is soft.  Genitourinary:    Comments: Tanner V Moderate amount of yellowish fluid in vagina; no lesion Cervix-closed, nontender, no IUD strings seen Skin:    General: Skin is warm and dry.  Neurological:     Mental Status: She is alert and oriented to person, place, and time.  Psychiatric:        Mood and Affect: Mood normal.   Bedside TVUS shows slightly thickened endometrium and IUD at fundus     Assessment & Plan:  35 year old female presents with IUD string check and bleeding throughout the month as well as postcoital. No evidence of vaginal trauma Daily strings not seen but bedside transvaginal ultrasound shows IUD in the endometrial canal.  Lining is thickened. Will give 2 weeks of Provera to hopefully thin endometrium.  If still bleeding in 6 weeks, will get an official ultrasound at the radiology unit. Patient  reading Come as You Are and has said this is helped her libido. Moderate d/c--aptima sent Pt has HTN and needs medication adjustment by cardiologist.   30 minutes spent with patient during exam, review of operative record, bedside ultrasound, and counseling.

## 2021-01-13 NOTE — Progress Notes (Signed)
No complaints with IUD

## 2021-01-14 LAB — CERVICOVAGINAL ANCILLARY ONLY
Bacterial Vaginitis (gardnerella): NEGATIVE
Candida Glabrata: NEGATIVE
Candida Vaginitis: NEGATIVE
Comment: NEGATIVE
Comment: NEGATIVE
Comment: NEGATIVE

## 2021-01-20 DIAGNOSIS — J301 Allergic rhinitis due to pollen: Secondary | ICD-10-CM | POA: Diagnosis not present

## 2021-01-20 DIAGNOSIS — J3089 Other allergic rhinitis: Secondary | ICD-10-CM | POA: Diagnosis not present

## 2021-01-20 DIAGNOSIS — J3081 Allergic rhinitis due to animal (cat) (dog) hair and dander: Secondary | ICD-10-CM | POA: Diagnosis not present

## 2021-01-27 DIAGNOSIS — I2510 Atherosclerotic heart disease of native coronary artery without angina pectoris: Secondary | ICD-10-CM | POA: Diagnosis not present

## 2021-01-27 DIAGNOSIS — J3081 Allergic rhinitis due to animal (cat) (dog) hair and dander: Secondary | ICD-10-CM | POA: Diagnosis not present

## 2021-01-27 DIAGNOSIS — J301 Allergic rhinitis due to pollen: Secondary | ICD-10-CM | POA: Diagnosis not present

## 2021-01-27 DIAGNOSIS — I10 Essential (primary) hypertension: Secondary | ICD-10-CM | POA: Diagnosis not present

## 2021-01-27 DIAGNOSIS — J3089 Other allergic rhinitis: Secondary | ICD-10-CM | POA: Diagnosis not present

## 2021-02-10 DIAGNOSIS — J3081 Allergic rhinitis due to animal (cat) (dog) hair and dander: Secondary | ICD-10-CM | POA: Diagnosis not present

## 2021-02-10 DIAGNOSIS — J301 Allergic rhinitis due to pollen: Secondary | ICD-10-CM | POA: Diagnosis not present

## 2021-02-10 DIAGNOSIS — J3089 Other allergic rhinitis: Secondary | ICD-10-CM | POA: Diagnosis not present

## 2021-02-17 DIAGNOSIS — J301 Allergic rhinitis due to pollen: Secondary | ICD-10-CM | POA: Diagnosis not present

## 2021-02-17 DIAGNOSIS — J3089 Other allergic rhinitis: Secondary | ICD-10-CM | POA: Diagnosis not present

## 2021-02-17 DIAGNOSIS — J3081 Allergic rhinitis due to animal (cat) (dog) hair and dander: Secondary | ICD-10-CM | POA: Diagnosis not present

## 2021-02-24 DIAGNOSIS — J301 Allergic rhinitis due to pollen: Secondary | ICD-10-CM | POA: Diagnosis not present

## 2021-02-24 DIAGNOSIS — J3081 Allergic rhinitis due to animal (cat) (dog) hair and dander: Secondary | ICD-10-CM | POA: Diagnosis not present

## 2021-02-24 DIAGNOSIS — J3089 Other allergic rhinitis: Secondary | ICD-10-CM | POA: Diagnosis not present

## 2021-03-03 DIAGNOSIS — J301 Allergic rhinitis due to pollen: Secondary | ICD-10-CM | POA: Diagnosis not present

## 2021-03-03 DIAGNOSIS — J3081 Allergic rhinitis due to animal (cat) (dog) hair and dander: Secondary | ICD-10-CM | POA: Diagnosis not present

## 2021-03-03 DIAGNOSIS — J3089 Other allergic rhinitis: Secondary | ICD-10-CM | POA: Diagnosis not present

## 2021-03-11 DIAGNOSIS — J3089 Other allergic rhinitis: Secondary | ICD-10-CM | POA: Diagnosis not present

## 2021-03-11 DIAGNOSIS — J3081 Allergic rhinitis due to animal (cat) (dog) hair and dander: Secondary | ICD-10-CM | POA: Diagnosis not present

## 2021-03-11 DIAGNOSIS — J301 Allergic rhinitis due to pollen: Secondary | ICD-10-CM | POA: Diagnosis not present

## 2021-03-12 DIAGNOSIS — I10 Essential (primary) hypertension: Secondary | ICD-10-CM | POA: Diagnosis not present

## 2021-03-12 DIAGNOSIS — I2510 Atherosclerotic heart disease of native coronary artery without angina pectoris: Secondary | ICD-10-CM | POA: Diagnosis not present

## 2021-03-24 DIAGNOSIS — J301 Allergic rhinitis due to pollen: Secondary | ICD-10-CM | POA: Diagnosis not present

## 2021-03-24 DIAGNOSIS — J3089 Other allergic rhinitis: Secondary | ICD-10-CM | POA: Diagnosis not present

## 2021-03-24 DIAGNOSIS — J3081 Allergic rhinitis due to animal (cat) (dog) hair and dander: Secondary | ICD-10-CM | POA: Diagnosis not present

## 2021-03-31 DIAGNOSIS — J301 Allergic rhinitis due to pollen: Secondary | ICD-10-CM | POA: Diagnosis not present

## 2021-03-31 DIAGNOSIS — J3081 Allergic rhinitis due to animal (cat) (dog) hair and dander: Secondary | ICD-10-CM | POA: Diagnosis not present

## 2021-03-31 DIAGNOSIS — J3089 Other allergic rhinitis: Secondary | ICD-10-CM | POA: Diagnosis not present

## 2021-04-07 DIAGNOSIS — J3089 Other allergic rhinitis: Secondary | ICD-10-CM | POA: Diagnosis not present

## 2021-04-07 DIAGNOSIS — J3081 Allergic rhinitis due to animal (cat) (dog) hair and dander: Secondary | ICD-10-CM | POA: Diagnosis not present

## 2021-04-07 DIAGNOSIS — J301 Allergic rhinitis due to pollen: Secondary | ICD-10-CM | POA: Diagnosis not present

## 2021-04-14 DIAGNOSIS — J3081 Allergic rhinitis due to animal (cat) (dog) hair and dander: Secondary | ICD-10-CM | POA: Diagnosis not present

## 2021-04-14 DIAGNOSIS — J301 Allergic rhinitis due to pollen: Secondary | ICD-10-CM | POA: Diagnosis not present

## 2021-04-14 DIAGNOSIS — J3089 Other allergic rhinitis: Secondary | ICD-10-CM | POA: Diagnosis not present

## 2021-04-21 DIAGNOSIS — J301 Allergic rhinitis due to pollen: Secondary | ICD-10-CM | POA: Diagnosis not present

## 2021-04-21 DIAGNOSIS — J3081 Allergic rhinitis due to animal (cat) (dog) hair and dander: Secondary | ICD-10-CM | POA: Diagnosis not present

## 2021-04-21 DIAGNOSIS — J3089 Other allergic rhinitis: Secondary | ICD-10-CM | POA: Diagnosis not present

## 2021-05-05 DIAGNOSIS — J3081 Allergic rhinitis due to animal (cat) (dog) hair and dander: Secondary | ICD-10-CM | POA: Diagnosis not present

## 2021-05-05 DIAGNOSIS — J301 Allergic rhinitis due to pollen: Secondary | ICD-10-CM | POA: Diagnosis not present

## 2021-05-05 DIAGNOSIS — J3089 Other allergic rhinitis: Secondary | ICD-10-CM | POA: Diagnosis not present

## 2021-05-12 DIAGNOSIS — J301 Allergic rhinitis due to pollen: Secondary | ICD-10-CM | POA: Diagnosis not present

## 2021-05-12 DIAGNOSIS — J3081 Allergic rhinitis due to animal (cat) (dog) hair and dander: Secondary | ICD-10-CM | POA: Diagnosis not present

## 2021-05-12 DIAGNOSIS — J3089 Other allergic rhinitis: Secondary | ICD-10-CM | POA: Diagnosis not present

## 2021-05-19 ENCOUNTER — Ambulatory Visit: Payer: Medicaid Other | Admitting: Physician Assistant

## 2021-05-19 ENCOUNTER — Other Ambulatory Visit: Payer: Self-pay

## 2021-05-19 ENCOUNTER — Encounter: Payer: Self-pay | Admitting: Physician Assistant

## 2021-05-19 VITALS — BP 126/78

## 2021-05-19 DIAGNOSIS — I10 Essential (primary) hypertension: Secondary | ICD-10-CM

## 2021-05-19 DIAGNOSIS — F53 Postpartum depression: Secondary | ICD-10-CM

## 2021-05-19 DIAGNOSIS — E282 Polycystic ovarian syndrome: Secondary | ICD-10-CM | POA: Diagnosis not present

## 2021-05-19 DIAGNOSIS — F411 Generalized anxiety disorder: Secondary | ICD-10-CM | POA: Diagnosis not present

## 2021-05-19 MED ORDER — LABETALOL HCL 100 MG PO TABS: 100.0000 mg | ORAL_TABLET | Freq: Two times a day (BID) | ORAL | 1 refills | Status: DC

## 2021-05-19 MED ORDER — SERTRALINE HCL 50 MG PO TABS: 50.0000 mg | ORAL_TABLET | Freq: Every day | ORAL | 0 refills | Status: DC

## 2021-05-19 MED ORDER — METFORMIN HCL 500 MG PO TABS
500.0000 mg | ORAL_TABLET | Freq: Two times a day (BID) | ORAL | 1 refills | Status: AC
Start: 2021-05-19 — End: ?

## 2021-05-19 NOTE — Progress Notes (Signed)
? ?Subjective:  ? ? Patient ID: Carol Wright, female    DOB: 1985/07/09, 36 y.o.   MRN: [id number] ? ?HPI ?This is a well appearing 36 year old female presenting for a 3 month follow up for a medication. She reports in increase in depressive symptoms over the past 1 month or so and is interested in discussing a medication or dose change to her Sertraline. She was started on zoloft by OB/GYN a few months ago.  She had her third child in July of last year. She has a previous history of using Wellbutrin for 3 months and reports she had a good response, similar to Sertraline. She also requests refills on Labetalol and Metformin. Declines flu vaccination. She has had thoughts of self harm but no plan. She is going through a lot of stress at home with her 9 year.  ? ?Review of Systems  ?Constitutional: Negative.  Negative for fever and unexpected weight change.  ?Respiratory: Negative.  Negative for cough and shortness of breath.   ?Cardiovascular:  Negative for chest pain.  ?Gastrointestinal: Negative.   ?Psychiatric/Behavioral:  Negative for self-injury and suicidal ideas.   ? ?   ?Objective:  ? Physical Exam ?Constitutional:   ?   General: She is not in acute distress. ?   Appearance: Normal appearance.  ?HENT:  ?   Head: Normocephalic and atraumatic.  ?Cardiovascular:  ?   Rate and Rhythm: Normal rate and regular rhythm.  ?   Pulses: Normal pulses.  ?   Heart sounds: Normal heart sounds. No murmur heard. ?  No friction rub. No gallop.  ?Pulmonary:  ?   Effort: Pulmonary effort is normal.  ?   Breath sounds: Normal breath sounds. No wheezing.  ?Neurological:  ?   Mental Status: She is alert and oriented to person, place, and time.  ?Psychiatric:     ?   Mood and Affect: Mood normal.     ?   Behavior: Behavior normal.  ?.. ?Depression screen [facility] 2/9 05/19/2021 05/19/2021 11/11/2020 09/30/2020 09/12/2020  ?Decreased Interest 1 0 0 0 0  ?Down, Depressed, Hopeless '1 1 3 '$ 0 0  ?PHQ - 2 Score '2 1 3 '$ 0 0  ?Altered sleeping 1 - 3 0 0   ?Tired, decreased energy 1 - 1 1 0  ?Change in appetite 1 - 3 0 0  ?Feeling bad or failure about yourself  1 - 3 0 0  ?Trouble concentrating 2 - 3 0 0  ?Moving slowly or fidgety/restless 1 - 0 0 0  ?Suicidal thoughts 0 - 0 0 0  ?PHQ-9 Score 9 - 16 1 0  ?Difficult doing work/chores Not difficult at all - Somewhat difficult - -  ? ?.. ?GAD 7 : Generalized Anxiety Score 05/19/2021 11/11/2020 09/30/2020 09/12/2020  ?Nervous, Anxious, on Edge 1 2 0 0  ?Control/stop worrying 2 3 0 0  ?Worry too much - different things 2 3 0 0  ?Trouble relaxing 2 2 0 0  ?Restless 2 0 0 0  ?Easily annoyed or irritable 3 3 0 0  ?Afraid - awful might happen 0 0 0 0  ?Total GAD 7 Score 12 13 0 0  ?Anxiety Difficulty Somewhat difficult Somewhat difficult - -  ? ? ? ? ?   ?Assessment & Plan:  ? ?Marland KitchenSabino Wright was seen today for follow-up. ? ?Diagnoses and all orders for this visit: ? ?Post-partum depression ?-     sertraline (ZOLOFT) 50 MG tablet; Take 1 tablet (50 mg total)  by mouth daily. ?-     Ambulatory referral to Psychology ? ?PCO (polycystic ovaries) ?-     metFORMIN (GLUCOPHAGE) 500 MG tablet; Take 1 tablet (500 mg total) by mouth 2 (two) times daily with a meal. ? ?Generalized anxiety disorder ?-     sertraline (ZOLOFT) 50 MG tablet; Take 1 tablet (50 mg total) by mouth daily. ?-     Ambulatory referral to Psychology ? ?Essential hypertension ?-     labetalol (NORMODYNE) 100 MG tablet; Take 1 tablet (100 mg total) by mouth 2 (two) times daily. ? ? ?Increase Zoloft to '50mg'$  daily. Could continue to increase or consider adding wellbutrin.  ?Discussed counseling with patient and she was agreeable.  ?Discussed self care and regular exercise.  ?Follow up in 3 months as scheduled or sooner with any new or worsening symptoms. ?Discussed [phone] number as well as 24 hour behavioral health clinic in [place]. ?

## 2021-06-09 DIAGNOSIS — I2510 Atherosclerotic heart disease of native coronary artery without angina pectoris: Secondary | ICD-10-CM | POA: Diagnosis not present

## 2021-06-09 DIAGNOSIS — I10 Essential (primary) hypertension: Secondary | ICD-10-CM | POA: Diagnosis not present

## 2021-07-08 ENCOUNTER — Ambulatory Visit (HOSPITAL_COMMUNITY): Payer: Medicaid Other | Admitting: Licensed Clinical Social Worker

## 2021-07-08 NOTE — Progress Notes (Signed)
Therapist contacted patient through My Chart for session and she did not respond session is a no show ?

## 2021-07-09 ENCOUNTER — Ambulatory Visit (INDEPENDENT_AMBULATORY_CARE_PROVIDER_SITE_OTHER): Payer: Medicaid Other | Admitting: Licensed Clinical Social Worker

## 2021-07-09 DIAGNOSIS — F411 Generalized anxiety disorder: Secondary | ICD-10-CM | POA: Diagnosis not present

## 2021-07-09 DIAGNOSIS — F53 Postpartum depression: Secondary | ICD-10-CM

## 2021-07-09 NOTE — Progress Notes (Signed)
Virtual Visit via Video Note ? ?I connected with Carol Wright on 07/09/21 at  4:00 PM EDT by a video enabled telemedicine application and verified that I am speaking with the correct person using two identifiers. ? ?Location: ?Patient: home ?Provider: home office ?  ?I discussed the limitations of evaluation and management by telemedicine and the availability of in person appointments. The patient expressed understanding and agreed to proceed. ? ?History of Present Illness: ? ?  ?Observations/Objective: ? ? ?Assessment and Plan: ? ? ?Follow Up Instructions: ? ?  ?I discussed the assessment and treatment plan with the patient. The patient was provided an opportunity to ask questions and all were answered. The patient agreed with the plan and demonstrated an understanding of the instructions. ?  ?The patient was advised to call back or seek an in-person evaluation if the symptoms worsen or if the condition fails to improve as anticipated. ? ?I provided 60 minutes of non-face-to-face time during this encounter. ? ?Comprehensive Clinical Assessment (CCA) Note ? ?07/09/2021 ?Carol Wright ?[id number] ? ?Chief Complaint:  ?Chief Complaint  ?Patient presents with  ? Depression  ? Anxiety  ? Self-esteem  ? ?Visit Diagnosis: Postpartum depression, generalized anxiety disorder ? ? ? ?CCA Biopsychosocial ?Intake/Chief Complaint:  Mom always had high functioning depression. Worse after Sylar ended up with post partum depression, managed but gotten worse. PCP-given her Zoloft 50 mg doesn't seem to be working more bad days than good. 9 months. After son not bad until last couple of months anxiety bad can't focus and concentrate on this, always worrying, bite lip when younger that stopped, recently started to bite inside lips and cheeks. Couple of months ago depression worse. Thought about self-harm. Plan-many plans through many years. Thought about twice for last few months. Explored plans-where going to do whatever she is going  to do to herself, who she wants to find herself, always think about the clean up don't want the family to think about the clean up.  Therapist develop safety plan with patient and she agreed when symptoms escalate to talk to husband and therapist if need to call: Crisis line no to local emergency or call 911 therapist explained expression of feeling helps de-escalate mood ? ?Current Symptoms/Problems: depression, anxiety ? ? ?Patient Reported Schizophrenia/Schizoaffective Diagnosis in Past: No ? ? ?Strengths: Skylar her daughter says she looks great with and without make and that she has beautiful ? ?Preferences: med management, therapy ? ?Abilities: patient is mother of three children ? ? ?Type of Services Patient Feels are Needed: therapy, med management ? ? ?Initial Clinical Notes/Concerns: Treatment history-only 9 months medication. Family history-sister-depression and anxiety. Medical issues-high blood pressure, thought heart attack 2013, heart disease, bare mental stent ? ? ?Mental Health Symptoms ?Depression:   ?Change in energy/activity; Difficulty Concentrating; Fatigue; Sleep (too much or little); Increase/decrease in appetite; Weight gain/loss; Hopelessness; Irritability; Tearfulness; Worthlessness (where sit fall asleep and at night wake up at 2 in the morning.) ?Patient describes having depression on and off for years  ?Duration of Depressive symptoms:  ?Greater than two weeks ?  ?Mania:   ?None ?  ?Anxiety:    ?Worrying; Difficulty concentrating; Fatigue; Irritability; Sleep; Tension ?Patient describes anxiety off and on for years  ?Psychosis:   ?None ?  ?Duration of Psychotic symptoms: No data recorded  ?Trauma:   ?None ?  ?Obsessions:   ?None ?  ?Compulsions:   ?None ?  ?Inattention:   ?None ?  ?Hyperactivity/Impulsivity:   ?None ?  ?Oppositional/Defiant Behaviors:  No data recorded  ?Emotional Irregularity:   ?None ?  ?Other Mood/Personality Symptoms:   ?Feels like a lot of stress re-evaluation for  Sylar at [place] might have schizophrenia, new baby, Sylar doesn't do well at school, grandfather has Alzheimer's. ?  ? ?Mental Status Exam ?Appearance and self-care  ?Stature:   ?Small ?  ?Weight:   ?Overweight ?  ?Clothing:   ?Casual ?  ?Grooming:   ?Normal ?  ?Cosmetic use:   ?None ?  ?Posture/gait:   ?Normal ?  ?Motor activity:   ?Not Remarkable ?  ?Sensorium  ?Attention:   ?Normal ?  ?Concentration:   ?Normal ?  ?Orientation:   ?X5 ?  ?Recall/memory:   ?Normal ?  ?Affect and Mood  ?Affect:   ?Blunted ?  ?Mood:   ?Anxious; Depressed ?  ?Relating  ?Eye contact:   ?Normal ?  ?Facial expression:   ?Responsive ?  ?Attitude toward examiner:   ?Cooperative ?  ?Thought and Language  ?Speech flow:  ?Normal ?  ?Thought content:   ?Appropriate to Mood and Circumstances ?  ?Preoccupation:   ?None ?  ?Hallucinations:   ?None ?  ?Organization:  No data recorded  ?Executive Functions  ?Fund of Knowledge:   ?Average ?  ?Intelligence:   ?Average ?  ?Abstraction:   ?Normal ?  ?Judgement:   ?Fair ?  ?Reality Testing:   ?Realistic ?  ?Insight:   ?Fair ?  ?Decision Making:   ?Paralyzed ?  ?Social Functioning  ?Social Maturity:   ?Isolates ?  ?Social Judgement:   ?Normal ?  ?Stress  ?Stressors:   ?Family conflict; Financial ?  ?Coping Ability:   ?Overwhelmed; Exhausted ?  ?Skill Deficits:   ?Communication (Patient reports when she is in her funk not as communicative) ?  ?Supports:   ?Family (sister-Household-baby-Richard 9 months, Skylar-10, Charlotte-4 and husband) ?  ? ? ?Religion: ?Religion/Spirituality ?Are You A Religious Person?: Yes ?What is Your Religious Affiliation?: [place] ?How Might This Affect Treatment?: n/a ? ?Leisure/Recreation: ?Leisure / Recreation ?Do You Have Hobbies?: No (see above) ? ?Exercise/Diet: ?Exercise/Diet ?Do You Exercise?: No ?Have You Gained or Lost A Significant Amount of Weight in the Past Six Months?: Yes-Gained ?Number of Pounds Gained: 20 ?Do You Follow a Special Diet?: No ?Do You Have Any  Trouble Sleeping?: Yes ?Explanation of Sleeping Difficulties: up at at 2 and can't get back to sleep. Fall asleep at 10 or 11 and then wake up at 2 AM ? ? 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 ?Education: ?Education ?Is Patient Currently Attending School?: No ?Last Grade Completed: 16 ?Name of High School: [employer] ?Did You Graduate From [employer]?: Yes ?Did You Attend College?: Yes ?What Type of College Degree Do you Have?: started off wanted to [place] went off liked history change history major. Two certificates Psychologist, sport and exercise after high Scientist, research (life sciences) ?Did You Attend Graduate School?: No ?What Was Your Major?: see above ?Did You Have Any Special Interests In School?: see above ?Did You Have An Individualized Education Program (IIEP): No ?Did You Have Any Difficulty At School?: No ?Patient's Education Has Been Impacted by Current Illness: No ? ? ?CCA Family/Childhood History ?Family and Relationship History: ?Family  history ?Marital status: Married ?Number of Years Married: 65 ?What types of issues is patient dealing with in the relationship?: n/a ?Are you sexually active?: Yes ?What is your sexual orientation?: heterosexual ?Has your sexual activity been affected by drugs, alcohol, medication, or emotional stress?: emotional stress ?Does patient have children?: Yes ?How many children?: 3 ?How is patient's relationship with their children?: skylar stressed ? ?Childhood History:  ?Childhood History ?By whom was/is the patient raised?: Grandparents ?Additional childhood history information: Maternal grandparents terrible grandparents were  amazing. ?Description of patient's relationship with caregiver when they were a child: Mom-never gave custody to grandparents they just took them and she worked a lot a night more a person dated than a mom. Dad-once

## 2021-07-09 NOTE — Progress Notes (Signed)
8

## 2021-08-18 ENCOUNTER — Ambulatory Visit: Payer: Medicaid Other | Admitting: Physician Assistant

## 2021-08-18 ENCOUNTER — Encounter: Payer: Self-pay | Admitting: Physician Assistant

## 2021-08-18 VITALS — BP 140/85 | HR 80 | Ht 63.0 in | Wt 233.0 lb

## 2021-08-18 DIAGNOSIS — I2510 Atherosclerotic heart disease of native coronary artery without angina pectoris: Secondary | ICD-10-CM

## 2021-08-18 DIAGNOSIS — F53 Postpartum depression: Secondary | ICD-10-CM

## 2021-08-18 DIAGNOSIS — I10 Essential (primary) hypertension: Secondary | ICD-10-CM | POA: Diagnosis not present

## 2021-08-18 DIAGNOSIS — Z6841 Body Mass Index (BMI) 40.0 and over, adult: Secondary | ICD-10-CM

## 2021-08-18 DIAGNOSIS — F411 Generalized anxiety disorder: Secondary | ICD-10-CM

## 2021-08-18 MED ORDER — WEGOVY 0.25 MG/0.5ML ~~LOC~~ SOAJ: 0.2500 mg | SUBCUTANEOUS | 0 refills | Status: AC

## 2021-08-18 MED ORDER — BUPROPION HCL ER (XL) 150 MG PO TB24: 150.0000 mg | ORAL_TABLET | ORAL | 0 refills | Status: DC

## 2021-08-18 MED ORDER — WEGOVY 0.5 MG/0.5ML ~~LOC~~ SOAJ: 0.5000 mg | SUBCUTANEOUS | 1 refills | Status: AC

## 2021-08-18 MED ORDER — SERTRALINE HCL 50 MG PO TABS: 50.0000 mg | ORAL_TABLET | Freq: Every day | ORAL | 0 refills | Status: DC

## 2021-08-18 NOTE — Progress Notes (Signed)
Established Patient Office Visit  Subjective   Patient ID: Carol Wright, female    DOB: 1986-02-06  Age: 36 y.o. MRN: [id number]  Chief Complaint  Patient presents with   Follow-up    HPI Pt is a 36 yo obese female with HTN, CAD, MDD, GAD who presents to the clinic for follow up.   She saw cardiology and was suggested she lose weight. She wants to discuss this today. She does not exercise but it is very active with 3 kids and she stays at home. Home BP readings 130s over 80s.   Her depression is not controlled. No SI/HC. She is in counseling and made appt for July with psychiatry. Zoloft has not helped very much. She does feel less anxious.    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   H/O: cesarean section 10/11/2016   Ventricular aneurysm 04/22/2018   Class 3 severe obesity due to excess calories with serious comorbidity and body mass index (BMI) of 40.0 to 44.9 in adult [facility]) 11/13/2019   History of 2 cesarean sections 10/07/2020   Family history of glaucoma in grandfather 11/11/2020   No energy 11/11/2020   Recent skin changes 11/11/2020   Resolved Ambulatory Problems    Diagnosis Date Noted   Obesity (BMI 30-39.9) 06/20/2013   Maternal coronary artery disease affecting pregnancy, antepartum 07/12/2013   Elevated blood pressure reading 06/22/2014   Sore throat 07/17/2014   Infertility management 05/06/2016   Supervision of high risk pregnancy, antepartum 06/10/2016   Chronic hypertension during pregnancy, antepartum 07/06/2016   Supervision of high risk pregnancy, antepartum 03/25/2020   Encounter for care and examination of mother immediately  after delivery 10/07/2020   Past Medical History:  Diagnosis Date   Depression    H/O arterial dissection    History of pre-eclampsia    Hyperlipidemia    Myocardial infarction ([place]) 2013   PCOS (polycystic ovarian syndrome)    Pre-eclampsia      Review of Systems  All other systems reviewed and are negative.    Objective:     BP 140/85   Pulse 80   Ht '5\' 3"'$  (1.6 m)   Wt 233 lb (105.7 kg)   SpO2 99%   BMI 41.27 kg/m  BP Readings from Last 3 Encounters:  08/18/21 140/85  05/19/21 126/78  01/13/21 (!) 142/88   Wt Readings from Last 3 Encounters:  08/18/21 233 lb (105.7 kg)  01/13/21 224 lb (101.6 kg)  11/11/20 216 lb (98 kg)      Physical Exam Vitals reviewed.  Constitutional:      Appearance: Normal appearance. She is obese.  HENT:     Head: Normocephalic.  Cardiovascular:     Rate and Rhythm: Normal rate and regular rhythm.     Pulses: Normal pulses.     Heart sounds: Normal heart sounds.  Pulmonary:     Effort: Pulmonary effort is normal.     Breath sounds: Normal breath sounds.  Neurological:     Mental Status: She is alert and oriented to person, place, and time.  Psychiatric:        Mood and Affect: Mood normal.       Assessment &  Plan:  ..Laparis was seen today for follow-up.  Diagnoses and all orders for this visit:  Essential hypertension -     COMPLETE METABOLIC PANEL WITH GFR  Post-partum depression -     buPROPion (WELLBUTRIN XL) 150 MG 24 hr tablet; Take 1 tablet (150 mg total) by mouth every morning. -     sertraline (ZOLOFT) 50 MG tablet; Take 1 tablet (50 mg total) by mouth daily.  Generalized anxiety disorder -     buPROPion (WELLBUTRIN XL) 150 MG 24 hr tablet; Take 1 tablet (150 mg total) by mouth every morning. -     sertraline (ZOLOFT) 50 MG tablet; Take 1 tablet (50 mg total) by mouth daily.  Class 3 severe obesity due to excess calories with serious comorbidity and body mass index (BMI) of 40.0 to 44.9 in adult (HCC) -      WEGOVY 0.25 MG/0.5ML SOAJ; Inject 0.25 mg into the skin once a week. Use this dose for 1 month (4 shots) and then increase to next higher dose. -     WEGOVY 0.5 MG/0.5ML SOAJ; Inject 0.5 mg into the skin once a week. Use this dose for 1 month (4 shots) and then increase to next higher dose.  BP goal is 130/80 and home readings are really close No med changes today We are hoping weight loss will get patient to goal ..Discussed low carb diet with 1500 calories and 80g of protein.  Exercising at least 150 minutes a week.  My Fitness Pal could be a Microbiologist.  Start wegovy Discussed SE and titration up Follow up in 3 months   Depression and anxiety not controlled Psychiatrist appt in July Continue on zoloft Added wellbutrin  Denies SI/HC.   Return in about 3 months (around 11/18/2021).    Carol Planas, PA-C

## 2021-08-19 LAB — COMPLETE METABOLIC PANEL WITH GFR
AG Ratio: 1.6 (calc) (ref 1.0–2.5)
ALT: 24 U/L (ref 6–29)
AST: 18 U/L (ref 10–30)
Albumin: 4.3 g/dL (ref 3.6–5.1)
Alkaline phosphatase (APISO): 67 U/L (ref 31–125)
BUN: 14 mg/dL (ref 7–25)
CO2: 30 mmol/L (ref 20–32)
Calcium: 9.3 mg/dL (ref 8.6–10.2)
Chloride: 100 mmol/L (ref 98–110)
Creat: 0.69 mg/dL (ref 0.50–0.97)
Globulin: 2.7 g/dL (calc) (ref 1.9–3.7)
Glucose, Bld: 96 mg/dL (ref 65–99)
Potassium: 3.8 mmol/L (ref 3.5–5.3)
Sodium: 139 mmol/L (ref 135–146)
Total Bilirubin: 1.1 mg/dL (ref 0.2–1.2)
Total Protein: 7 g/dL (ref 6.1–8.1)
eGFR: 114 mL/min/{1.73_m2} (ref >=60)

## 2021-08-19 NOTE — Progress Notes (Signed)
Kidney, liver, glucose look great.

## 2021-08-25 ENCOUNTER — Ambulatory Visit (INDEPENDENT_AMBULATORY_CARE_PROVIDER_SITE_OTHER): Payer: Medicaid Other | Admitting: Licensed Clinical Social Worker

## 2021-08-25 ENCOUNTER — Encounter (HOSPITAL_COMMUNITY): Payer: Self-pay

## 2021-08-25 DIAGNOSIS — F331 Major depressive disorder, recurrent, moderate: Secondary | ICD-10-CM | POA: Diagnosis not present

## 2021-08-25 DIAGNOSIS — F411 Generalized anxiety disorder: Secondary | ICD-10-CM

## 2021-08-25 NOTE — Plan of Care (Signed)
  Problem: Depression CCP Problem  1 decrease symptoms Goal:  decrease anxiety Description: Patient says constantly worrying about things.  Outcome: Not Progressing Goal: patient says needs to work on self-esteem Outcome: Not Progressing Goal: LTG: Reduce frequency, intensity, and duration of depression symptoms as evidenced by: SSB input needed on appropriate metric Outcome: Not Progressing Goal: STG: Bibiana WILL IDENTIFY as needed COGNITIVE PATTERNS AND BELIEFS THAT SUPPORT DEPRESSION Outcome: Not Progressing

## 2021-08-25 NOTE — Progress Notes (Signed)
Virtual Visit via Video Note  I connected with Carol Wright on 08/25/21 at  8:00 AM EDT by a video enabled telemedicine application and verified that I am speaking with the correct person using two identifiers.  Location: Patient: Stationary car Provider: office   I discussed the limitations of evaluation and management by telemedicine and the availability of in person appointments. The patient expressed understanding and agreed to proceed.  I discussed the assessment and treatment plan with the patient. The patient was provided an opportunity to ask questions and all were answered. The patient agreed with the plan and demonstrated an understanding of the instructions.   The patient was advised to call back or seek an in-person evaluation if the symptoms worsen or if the condition fails to improve as anticipated.  I provided 53 minutes of non-face-to-face time during this encounter.  THERAPIST PROGRESS NOTE  Session Time: 8:00 AM to 8:53 AM  Participation Level: Active  Behavioral Response: CasualAlertAnxious and Depressed  Type of Therapy: Individual Therapy  Treatment Goals addressed: Decreased depression, decrease anxiety increase self-esteem, coping  ProgressTowards Goals: Initial  Interventions: CBT, Solution Focused, Strength-based, Supportive, and Other: coping  Summary: Carol Wright is a 36 y.o. female who presents with therapist exploring triggers for depression and anxiety.  Patient says definitely with family. Grandfather alzheimer dementia last two weeks difficult mom takes care of them. Patient has three kids mom decided to go on vacation for two weeks. As always she puts it on her. Stayed as long as could to watch them but also has to take care of her kids. Watching Grandfather who is agitated advanced with his Alzheimer and kids. Nothing done at her house giant diaster. Sister pregnant sister planned baby shower plan drive up together for the weekend (with  grandparents). Drive back down tomorrow morning. Brought grandfather and he is disoriented and agitated causing disruption. Mom wants to stay another week to clean the house. Driving home with three kids and grandparents feels a lot. Sister told Mom that it is horrible idea. Still hasn't made the definite plan Mom always does what she wants. Post partum with Skylar. Birth didn't trigger had this before. Vickii Chafe is trying very defiant screaming, running doing that when visiting someone else's home. She has good days then week or two of nonsense. When she is at school so much calmer, so much laughter. When home agitates sister or hurt her sister and brother. Plays rough with Baldo Ash and Richard. Gets violent when angry. Family calming when she is not here. Patient thought perhaps get the treatment she needs and come back and better. She is stressor at home. Time out room to calm her down.  Therapist encouraged this is a good consequence enjoyed sister's shower but Skylar difficult. She pushed patient in the bathroom. She doesn't listen. Husband said don't take to beach overstimulated.  Reviewed CBT strategies and patient's reaction and she says patient believes the thought that have, doesn't help take a minute to think whether thought reasonable. Problem once mind gets going her reality.  Therapist provided positive feedback for insight to begin working on Community education officer work with patient on current stressors as sources for depression and anxiety and therapist validated patient on different stressors.  Therapist encouraged patient with setting firm boundaries with her mom and listing her sister as an Multimedia programmer for standing up to plan of coming home that only makes sense.  Discussed her daughter another stressor validated her on this as well as again  setting boundaries as best she can encourage possibly a higher level of care.  Introduced CBT concepts to help patient see she can intervene with both her thoughts  and behaviors in managing depression.  Thoughts that feed depression are usually gloomy, helps to become more active to counter the cycle of depression.  Therapist shared insight that she needs to question her thoughts more rather than take them as reality, therapist introduced ABC sheet so that she could begin to take daily events and begin to break them down to notice unhelpful thoughts encounter was something more helpful.  Therapist also said another strategy is letting go of negative thoughts they are not always helpful not always accurate.  Completed treatment plan and patient gave consent to complete virtually. Suicidal/Homicidal: No  Plan: Return again in 1 weeks.2.  Continue with CBT for depression, look at therapist aid, look at notes from chat, review stuck points  Diagnosis: Major depressive disorder, moderate, recurrent, generalized anxiety disorder  Collaboration of Care: Other review of PCP note  Patient/Guardian was advised Release of Information must be obtained prior to any record release in order to collaborate their care with an outside provider. Patient/Guardian was advised if they have not already done so to contact the registration department to sign all necessary forms in order for [place] to release information regarding their care.   Consent: Patient/Guardian gives verbal consent for treatment and assignment of benefits for services provided during this visit. Patient/Guardian expressed understanding and agreed to proceed.   Cordella Register, LCSW 08/25/2021

## 2021-08-25 NOTE — Plan of Care (Signed)
Patient participated in completion of treatment plan 

## 2021-09-15 ENCOUNTER — Telehealth (HOSPITAL_COMMUNITY): Payer: Medicaid Other | Admitting: Psychiatry

## 2021-09-22 ENCOUNTER — Ambulatory Visit (INDEPENDENT_AMBULATORY_CARE_PROVIDER_SITE_OTHER): Payer: Medicaid Other | Admitting: Licensed Clinical Social Worker

## 2021-09-22 DIAGNOSIS — F411 Generalized anxiety disorder: Secondary | ICD-10-CM

## 2021-09-22 DIAGNOSIS — F331 Major depressive disorder, recurrent, moderate: Secondary | ICD-10-CM

## 2021-09-22 NOTE — Progress Notes (Signed)
Virtual Visit via Video Note  I connected with Carsen Machi on 09/22/21 at  8:00 AM EDT by a video enabled telemedicine application and verified that I am speaking with the correct person using two identifiers.  Location: Patient: home Provider: office   I discussed the limitations of evaluation and management by telemedicine and the availability of in person appointments. The patient expressed understanding and agreed to proceed.   I discussed the assessment and treatment plan with the patient. The patient was provided an opportunity to ask questions and all were answered. The patient agreed with the plan and demonstrated an understanding of the instructions.   The patient was advised to call back or seek an in-person evaluation if the symptoms worsen or if the condition fails to improve as anticipated.  I provided 50 minutes of non-face-to-face time during this encounter.  THERAPIST PROGRESS NOTE  Session Time: 8:00 AM to 50 AM  Participation Level: Active  Behavioral Response: CasualAlertappropriate  Type of Therapy: Individual Therapy  Treatment Goals addressed: Decreased depression, decrease anxiety increase self-esteem, coping  ProgressTowards Goals: Progressing-worked on mindfulness as emotional regulation skill for patient, as a self-care strategy as well as strategies for parenting a child with ODD  Interventions: Solution Focused, Strength-based, Supportive, Meditation: Mindfulness, and Other: Coping  Summary: Su Duma is a 36 y.o. female who presents with patient is doing alright. Life is sometimes stressful.  Therapist pointed out looking at her triggers from last session to help with coping.  Mom is an ongoing stressor doing what she wants when she wants it. Stress related to Mom subsided but still she calls. Grandfather had cataract surgery. Different scenarios that were triggers that were stressful.  A nurse insulting grandfather got heated mom calling in the  middle of appointment for help. Vickii Chafe is stressful disrespectful with patient. She doesn't learn same thing over and over.  That is a stressor.  As we explored further father works [employer] estate home Mom so everything is on her. Explored self-care.  Did get to [place] sister asked her to be witnessed for her wedding did give her a chance to get away for short period. wedding. They are going to have a vacation in August will go to [place] for sisters pregnancy and birth sister choosing her over mom. Use humor sister to downsize the stress with mom.  Relating if they felt the pinprick from a voodoo doll from mom. All going to go for a week with sister's giving birth.  Asked for patient's response to information from session and patient says definitely does at home autopilot mode. Goal was not to get married and have children. Goes to fantasy life and dis-engage from everything around.  Her goal was to live in [place] and that is where she goes in her fantasy. Then feels bad no kids fantasize a life without kids then snowball.  Therapist provided her perspective that we all have fantasy lives there is nothing wrong with escaping, at the same time balancing that with cultivating gratitude helpful for depression and negative thoughts.      Therapist summarized last session talking about helping patient with depression by focusing on triggers being able to process feelings to help her with coping as well as strategizing around triggers.  Noted they include her mom and her daughter.  Therapist reviewed book "overcoming oppositional defiant disorder" to note strategy in this focus first to focus on self-care for parent and noted for patient is limited.  Noted you  must recognize and start to meet your own needs first before he will be ready to implement the program.  Understand your patterns that have emerged in your relationship with your ODD child and learned about the ways those patterns may be undermining your  parenting goals.  We will provide tools to help you respond your child with greater, and more focused intention.  First teaching healthy and practical communication tactics which includes breaking the cycle of reactivity.  Emotional reactivity refers to intense emotional arousal which can result in problematic behaviors its an unrestrained explosion of uncomfortable feelings.  Though it serves the function of temporarily releasing the discomfort is rarely helpful means of problem solving mindfully thoughtfully responding to the situation rather than simply reacting to the difficult behavior in the moment.  Reaction is automatic and very little thought or purpose goes into it responding on the other hand requires intent it is not automatic it is a purposeful action based on your goals for this situations outcome.  Note patient sees ways she does react.  Noticed mindfulness helpful for self-care, for emotional regulation.  Therapist explained mindfulness to patient how it works that the practice trained you to be aware of what is happening or what you are feeling in the moment without judging think that experience or struggling against it the skills that mindfulness teaches nonreactivity staying grounded in the present experience.  Therapist reviewed to worksheets on mindfulness to describe qualities as well as using it for being aware and letting go.  In general therapist explained it involves being aware of aspects of the mind and by reflecting on the mind we are unable to make choices in this change becomes possible.  Therapist relates mindfulness is something he needs to be practiced to be able to better observe describe be more present all attributes some mindfulness.  Explored with patient she will fantasize therapist said fantasy is not negative it provides relief escape something we will do.  The same time and working on depression helpful also to cultivate gratitude.  Therapist provided space and support for  patient to talk about thoughts and feelings in session Suicidal/Homicidal: No  Plan: Return again in 2 weeks.2.  Look at therapist [facility] worksheet on mindfulness, depression worksheet, CBT [place] for depression, automatic thoughts, cognitive distortions, Chat depression  Diagnosis: Major depressive disorder, moderate, recurrent, generalized anxiety disorder  Collaboration of Care: Other none needed  Patient/Guardian was advised Release of Information must be obtained prior to any record release in order to collaborate their care with an outside provider. Patient/Guardian was advised if they have not already done so to contact the registration department to sign all necessary forms in order for [place] to release information regarding their care.   Consent: Patient/Guardian gives verbal consent for treatment and assignment of benefits for services provided during this visit. Patient/Guardian expressed understanding and agreed to proceed.   Cordella Register, LCSW 09/22/2021

## 2021-10-06 ENCOUNTER — Telehealth (INDEPENDENT_AMBULATORY_CARE_PROVIDER_SITE_OTHER): Payer: Medicaid Other | Admitting: Licensed Clinical Social Worker

## 2021-10-06 ENCOUNTER — Encounter: Payer: Self-pay | Admitting: Obstetrics & Gynecology

## 2021-10-06 ENCOUNTER — Ambulatory Visit: Payer: Medicaid Other | Admitting: Obstetrics & Gynecology

## 2021-10-06 VITALS — BP 165/103 | HR 72 | Ht 63.0 in | Wt 228.0 lb

## 2021-10-06 DIAGNOSIS — I10 Essential (primary) hypertension: Secondary | ICD-10-CM | POA: Diagnosis not present

## 2021-10-06 DIAGNOSIS — T8332XA Displacement of intrauterine contraceptive device, initial encounter: Secondary | ICD-10-CM | POA: Diagnosis not present

## 2021-10-06 DIAGNOSIS — F331 Major depressive disorder, recurrent, moderate: Secondary | ICD-10-CM | POA: Diagnosis not present

## 2021-10-06 DIAGNOSIS — I2510 Atherosclerotic heart disease of native coronary artery without angina pectoris: Secondary | ICD-10-CM | POA: Diagnosis not present

## 2021-10-06 DIAGNOSIS — N930 Postcoital and contact bleeding: Secondary | ICD-10-CM | POA: Diagnosis not present

## 2021-10-06 DIAGNOSIS — D219 Benign neoplasm of connective and other soft tissue, unspecified: Secondary | ICD-10-CM | POA: Diagnosis not present

## 2021-10-06 DIAGNOSIS — F411 Generalized anxiety disorder: Secondary | ICD-10-CM

## 2021-10-06 NOTE — Progress Notes (Signed)
Virtual Visit via Video Note  I connected with Carol Wright on 10/06/21 at  9:00 AM EDT by a video enabled telemedicine application and verified that I am speaking with the correct person using two identifiers.  Location: Patient: home Provider: office   I discussed the limitations of evaluation and management by telemedicine and the availability of in person appointments. The patient expressed understanding and agreed to proceed.   I discussed the assessment and treatment plan with the patient. The patient was provided an opportunity to ask questions and all were answered. The patient agreed with the plan and demonstrated an understanding of the instructions.   The patient was advised to call back or seek an in-person evaluation if the symptoms worsen or if the condition fails to improve as anticipated.  I provided 52 minutes of non-face-to-face time during this encounter.  THERAPIST PROGRESS NOTE  Session Time: 9:00 AM to 9:52 AM  Participation Level: Active  Behavioral Response: CasualAlertappropriate  Type of Therapy: Individual Therapy  Treatment Goals addressed: decreased depression, decrease anxiety increase self-esteem, coping  ProgressTowards Goals: Progressing-actively worked on patient's trigger as well as emotional regulation strategies of schemas, mindfulness  Interventions: CBT, Solution Focused, Strength-based, Supportive, and Other: Coping  Summary: Carol Wright is a 36 y.o. female who presents with things going well one thing going on in November is stressing cross that bridge when come to it. Mom is going away at Thanksgiving. Vented to sister that it is a lot. It is going to be chaotic. She doesn't need to go 50 year class reunion in [place].  Not close with these people explored possibility of patient saying no and difficulties of doing that.  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 Found a college in [place] for a business program. Mom didn't want her to go who will help her with younger sister who was a teenager. Patient felt bad.  Agreed at the same time with therapist not her job to watch her sister but told school at that time she couldn't attend. At home and wondered what is she going to do. 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 After that there was a little bit resentment eventually get through it but life happens school kept being on back burner. There is tons of resentment patient taking care of sister early memory patient five and sister 1. Mom always been about herself. Doesn't feel  remorse. When vent things that happened always want is apology doesn't get instead she says that bad mother nobody taught her how to be a mom. Grandparents close with patient they raised her. Came down to help them to pay them back. Close with grandmother. Mom resentful to patient how come favoritism. She is almost angry toward patient. She came to watch grandparents but doesn't do anything. When patient goes there goes more often. Patient helps out a lot when there. Doing things mom could do. When patient there helping and working. Explored what she brings to the table. Living a selfish life. No concept of how she affects people around. Tells husband not to say something patient says make it worse. He says take advantage of everyone around. Explored how it impacts her depression. Patient says impacts her in that she is hard on  Skylar expect her to be patient at 51. Not reasonable because not patient. In that regard impacting her negatively when see Skylar patient will get angry patient realizes she is not her and not fair to her.      Continue to process with patient one of her main triggers her mom both validating patient,  exploring possibilities of setting boundaries that appear to be difficult in this relationship.  Noted qualities about her mom that are challenging such as taking advantage of people around her not being apologetic not being helpful often.  Talked about her daughter and how patient recognizes not fair to her to expect her to be like herself therapist utilize reframing to notice her daughters unique qualities and to appreciate those.  Started looking at book "stop overthinking" emphasized to helpful points about mental health strategies including we take a neutral event we have to pay attention to the story we tell ourselves this is where we can intervene between nature and nurture, our previous disposition and stress environment.  Another important strategy is awareness this awareness gives [place] possibility to intervene, this awareness described as I do not know helping [place] with things such as overthinking pinpoint exactly what is going on in her heads when we overthink.  Its about identifying the triggers that set [place] off as well as the effects of that over thinking once it begins when we can see the process clearly we then begin to take informed action.  But the necessary starting point awareness.  We can cultivate this awareness and ourselves by regularly checking in with her bodily sensations thoughts and feelings by making sure that her lifestyle is supporting [place] in the ways we needed to buy including some form of mindfulness in everyday life.  Plan is for patient to start book so we can talk about for her to learn helpful coping skills. Suicidal/Homicidal: No  Plan: Return again in 2 weeks.2.  Start the book "stop overthinking" along with working on mindfulness from therapist aid, depression from therapist aid CBT [place] depression, automatic thoughts, cognitive distortions, chat depression  Diagnosis: Major depressive disorder, moderate, recurrent, generalized anxiety disorder  Collaboration of Care: Other  none needed  Patient/Guardian was advised Release of Information must be obtained prior to any record release in order to collaborate their care with an outside provider. Patient/Guardian was advised if they have not already done so to contact the registration department to sign all necessary forms in order for [place] to release information regarding their care.   Consent: Patient/Guardian gives verbal consent for treatment and assignment of benefits for services provided during this visit. Patient/Guardian expressed understanding and agreed to proceed.   Cordella Register, LCSW 10/06/2021

## 2021-10-06 NOTE — Progress Notes (Signed)
   Subjective:    Patient ID: Carol Wright, female    DOB: 02-13-1986, 36 y.o.   MRN: [id number]  HPI  36 yo female presents with bleeding after intercourse--gushes and then done.  No it is occurred after every intercourse for almost a year.  Pain.  Menses is 2 days and light with Liletta.    Review of Systems  Constitutional: Negative.   Respiratory: Negative.    Cardiovascular: Negative.   Gastrointestinal: Negative.   Genitourinary: Negative.  Negative for dyspareunia and pelvic pain.       Objective:   Physical Exam Vitals reviewed.  Constitutional:      General: She is not in acute distress.    Appearance: She is well-developed.  HENT:     Head: Normocephalic and atraumatic.  Eyes:     Conjunctiva/sclera: Conjunctivae normal.  Cardiovascular:     Rate and Rhythm: Normal rate.  Pulmonary:     Effort: Pulmonary effort is normal.  Genitourinary:    Comments: Tanner V Vulva:  No lesion Vagina:  Pink, no lesions, no discharge, small amount of brown blood at top of vault Cervix:  No CMT, IUD strings not seen, no ectropion Uterus:  Non tender, mobile    Skin:    General: Skin is warm and dry.  Neurological:     Mental Status: She is alert and oriented to person, place, and time.  Psychiatric:        Mood and Affect: Mood normal.    Vitals:   10/06/21 1509  BP: (!) 165/103  Pulse: 72  Weight: 228 lb (103.4 kg)  Height: '5\' 3"'$  (1.6 m)      Assessment & Plan:  36 year old female with postcoital bleeding for a year. No vaginal lesion and no cervical ectropion no cervical polyp Pelvic ultrasound complete with transvaginal to evaluate uterus and IUD placement. Patient is hypertensive today and also saw her cardiologist.  Her blood pressure medications were changed.

## 2021-10-10 ENCOUNTER — Encounter: Payer: Self-pay | Admitting: Obstetrics & Gynecology

## 2021-10-13 ENCOUNTER — Telehealth (INDEPENDENT_AMBULATORY_CARE_PROVIDER_SITE_OTHER): Payer: Medicaid Other | Admitting: Psychiatry

## 2021-10-13 ENCOUNTER — Encounter (HOSPITAL_COMMUNITY): Payer: Self-pay | Admitting: Psychiatry

## 2021-10-13 ENCOUNTER — Ambulatory Visit (INDEPENDENT_AMBULATORY_CARE_PROVIDER_SITE_OTHER): Payer: Medicaid Other

## 2021-10-13 DIAGNOSIS — Z30431 Encounter for routine checking of intrauterine contraceptive device: Secondary | ICD-10-CM | POA: Diagnosis not present

## 2021-10-13 DIAGNOSIS — D219 Benign neoplasm of connective and other soft tissue, unspecified: Secondary | ICD-10-CM

## 2021-10-13 DIAGNOSIS — F331 Major depressive disorder, recurrent, moderate: Secondary | ICD-10-CM

## 2021-10-13 DIAGNOSIS — T8332XA Displacement of intrauterine contraceptive device, initial encounter: Secondary | ICD-10-CM | POA: Diagnosis not present

## 2021-10-13 DIAGNOSIS — F411 Generalized anxiety disorder: Secondary | ICD-10-CM

## 2021-10-13 DIAGNOSIS — F53 Postpartum depression: Secondary | ICD-10-CM

## 2021-10-13 DIAGNOSIS — N888 Other specified noninflammatory disorders of cervix uteri: Secondary | ICD-10-CM | POA: Diagnosis not present

## 2021-10-13 DIAGNOSIS — N83201 Unspecified ovarian cyst, right side: Secondary | ICD-10-CM | POA: Diagnosis not present

## 2021-10-13 MED ORDER — SERTRALINE HCL 100 MG PO TABS: 100.0000 mg | ORAL_TABLET | Freq: Every day | ORAL | 0 refills | Status: DC

## 2021-10-13 NOTE — Progress Notes (Signed)
Psychiatric Initial Adult Assessment   Patient Identification: Carol Wright MRN:  [id number] Date of Evaluation:  10/13/2021 Referral Source: [facility] Chief Complaint:   Chief Complaint  Patient presents with   Depression   Establish Care   Visit Diagnosis:    ICD-10-CM   1. Major depressive disorder, recurrent episode, moderate (HCC)  F33.1     2. Generalized anxiety disorder  F41.1 sertraline (ZOLOFT) 100 MG tablet    3. 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 Post-partum depression  F53.0 sertraline (ZOLOFT) 100 MG tablet     Virtual Visit via Video Note  I connected with Carol Wright on 10/13/21 at  9:00 AM EDT by a video enabled telemedicine application and verified that I am speaking with the correct person using two identifiers.  Location: Patient: home Provider: home office   I discussed the limitations of evaluation and management by telemedicine and the availability of in person appointments. The patient expressed understanding and agreed to proceed.     I discussed the assessment and treatment plan with the patient. The patient was provided an opportunity to ask questions and all were answered. The patient agreed with the plan and demonstrated an understanding of the instructions.   The patient was advised to call back or seek an in-person evaluation if the symptoms worsen or if the condition fails to improve as anticipated.  I provided 55 minutes of non-face-to-face time during this encounter including chart review, documentation   History of Present Illness: Patient is a 36 years old currently married female she has 3 kids youngest is 46 years of age she is a homemaker.  Referred by primary care physician on-call start to establish for her depression and postpartum possible depression  Patient endorses having episodes of depression during her adult life.  She has had postpartum depression 10 years ago that she recovered with Wellbutrin.  She has now a  baby 1 year of age and was started on sertraline for depression added Wellbutrin couple of months ago she feels her depression has gotten somewhat better she is not feeling hopeless or helpless but still endorses feeling down withdrawn decreased energy at times tiredness and excessive sleep.  She also endorses worries excessive worries free-floating anxiety.  She is in therapy but she believes medication needs to be adjusted.  She has grandparents to take care of who lives nearby they have dementia she feels her mother does not help them much or is not available patient also has somewhat challenging relationship with her mom at times.  There is no associated psychotic symptoms along with the depression there is no clear manic symptoms currently or in the past  There has been some weight changes or weight increased recently.  She does not use drugs or alcohol one of her kids has ADHD overall she has a good relationship with her husband  She is in therapy to work on Radiographer, therapeutic and to work on distraction from a negative thoughts she dwells on anxiety and gets overwhelmed including the stressors related to dealing with the grandparents and her own responsibilities     Aggravating factors: taking care of grand parents, mom doesn't take much responsibility, grand pa has dementia  Modifying factor: marriage, kids Duration adult life Severity exacerbation by post partum  Hospital admission denies Drug use denies   Past Psychiatric History: depression  Previous Psychotropic Medications: Yes   Substance Abuse History in the last 12 months:  No.  Consequences of Substance  Abuse: NA  Past Medical History:  Past Medical History:  Diagnosis Date   Depression    Generalized anxiety disorder    H/O arterial dissection    Hepatic steatosis    Via ultrasound.     History of pre-eclampsia    Hyperlipidemia    Myocardial infarction ([place]) 2013   Obesity (BMI 30-39.9)    PCOS (polycystic  ovarian syndrome)    Pre-eclampsia    Spontaneous dissection of coronary artery    a. occurred in 2013 during pregnany. Underwent placement of BMS per the patient's report.     Past Surgical History:  Procedure Laterality Date   Arterial disection  03/17/2011   Bare metal stent  03/17/2011   CESAREAN SECTION  03/17/2011   CESAREAN SECTION N/A 10/07/2020   Procedure: CESAREAN SECTION;  Surgeon: Clarnce Flock, MD;  Location: [place];  Service: Obstetrics;  Laterality: N/A;   cholcyst     CHOLECYSTECTOMY      Family Psychiatric History: sister: depression  Family History:  Family History  Problem Relation Age of Onset   Hypertension Mother    Hypertension Maternal Grandmother    Heart attack Maternal Grandfather     Social History:   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  Other Topics Concern   Not on file  Social History Narrative   Not on file   Social Determinants of Health   Financial Resource Strain: Not on file  Food Insecurity: No Food Insecurity (09/05/2020)   Hunger Vital Sign    Worried About Running Out of Food in the Last Year: Never true    Ran Out of Food in the Last Year: Never true  Transportation Needs: No Transportation Needs (09/05/2020)   PRAPARE - Hydrologist (Medical): No    Lack of Transportation (Non-Medical): No  Physical Activity: Not on file  Stress: Not on file  Social Connections: Not on file    Additional Social History: grew up with mom, parents were divorced, grand  parents helped her , paitent felt mom was not there at times   Allergies:   Allergies  Allergen Reactions   Ace Inhibitors     cough    Metabolic Disorder Labs: Lab Results  Component Value Date   HGBA1C 4.5 03/25/2020   MPG 82 03/25/2020   MPG 82 11/27/2019   No results found for: "PROLACTIN" Lab Results  Component Value Date   CHOL 189 11/11/2020   TRIG 66 11/11/2020   HDL 45 (L) 11/11/2020   CHOLHDL 4.2 11/11/2020   VLDL 32 06/20/2013   LDLCALC 128 (H) 11/11/2020   LDLCALC 94 11/13/2019   Lab Results  Component Value Date   TSH 0.94 11/11/2020    Therapeutic Level Labs: No results found for: "LITHIUM" No results found for: "CBMZ" No results found for: "VALPROATE"  Current Medications: Current Outpatient Medications  Medication Sig Dispense Refill   aspirin 81 MG chewable  tablet Chew by mouth daily.     buPROPion (WELLBUTRIN XL) 150 MG 24 hr tablet Take 1 tablet (150 mg total) by mouth every morning. 90 tablet 0   cetirizine (ZYRTEC) 10 MG tablet Take 10 mg by mouth daily.     chlorthalidone (HYGROTON) 25 MG tablet Take 25 mg by mouth daily.     labetalol (NORMODYNE) 100 MG tablet Take 1 tablet (100 mg total) by mouth 2 (two) times daily. 180 tablet 1   levonorgestrel (LILETTA, 52 MG,) 20.1 MCG/DAY IUD 1 each by Intrauterine route once.     metFORMIN (GLUCOPHAGE) 500 MG tablet Take 1 tablet (500 mg total) by mouth 2 (two) times daily with a meal. 180 tablet 1   sertraline (ZOLOFT) 100 MG tablet Take 1 tablet (100 mg total) by mouth daily. 30 tablet 0   VAGIFEM 10 MCG TABS vaginal tablet Place vaginally. (Patient not taking: Reported on 08/18/2021)     WEGOVY 0.25 MG/0.5ML SOAJ Inject 0.25 mg into the skin once a week. Use this dose for 1 month (4 shots) and then increase to next higher dose. (Patient not taking: Reported on 10/06/2021) 2 mL 0   WEGOVY 0.5 MG/0.5ML SOAJ Inject 0.5 mg into the skin once a week. Use this dose for 1 month (4 shots) and then increase to  next higher dose. (Patient not taking: Reported on 10/06/2021) 2 mL 1   No current facility-administered medications for this visit.     Psychiatric Specialty Exam: Review of Systems  Cardiovascular:  Negative for chest pain.  Neurological:  Negative for tremors.  Psychiatric/Behavioral:  Negative for agitation.     unknown if currently breastfeeding.There is no height or weight on file to calculate BMI.  General Appearance: Casual  Eye Contact:  Fair  Speech:  Normal Rate  Volume:  Decreased  Mood:  Depressed  Affect:  Constricted  Thought Process:  Goal Directed  Orientation:  Full (Time, Place, and Person)  Thought Content:  Rumination  Suicidal Thoughts:  No  Homicidal Thoughts:  No  Memory:  Immediate;   Fair  Judgement:  Fair  Insight:  Fair  Psychomotor Activity:  Decreased  Concentration:  Concentration: Fair  Recall:  [employer] [place]: Fair  Akathisia:  No  Handed:    AIMS (if indicated):  not done  Assets:  Financial Resources/Insurance Housing Physical Health  ADL's:  Intact  Cognition: WNL  Sleep:   irregular at times   Screenings: [place] Office Visit from 05/19/2021 in [place] Office Visit from 11/11/2020 in [place] from 09/30/2020 in [facility] [place] [facility] Clinical Support from 09/12/2020 in Center for [employer] [facility] Clinical Support from 09/05/2020 in [facility] for [place] at [facility]  Total GAD-7 Score 12 13 0 0 0      PHQ2-9    Flowsheet Row Video Visit from 10/13/2021 in [place] [facility] from 07/09/2021 in [place] Office Visit from 05/19/2021 in [place] Visit from 11/11/2020 in [place] from 09/30/2020 in Center for [place] at [facility] for Women  PHQ-2 Total Score '2 3 2 3 '$ 0  PHQ-9 Total Score '10 18 9 '$ 16  [place] Video Visit from 10/13/2021 in [place] [facility] from 07/09/2021 in [place] Admission (Discharged) from 10/07/2020 in [place] [facility]  C-SSRS RISK CATEGORY No Risk Low Risk No Risk       Assessment and Plan: as follows  MDD recurrent moderate: increase zoloft to '75mg'$  and then '100mg'$  in one week Continue wellbutrin and therapy  GAD: increase zoloft as above Post partum depression: continue therapy and increase zoloft Continue to work on coping skills  Discussed to add options of how to get help to take care of grand parents Add ME time and distraction from negative thoughts  FU 68mor early if needed   Collaboration of Care: Other primary care and counsellor notes and meds reviewed  Patient/Guardian was advised Release of Information must be obtained prior to any record release in order to collaborate their care with an outside provider. Patient/Guardian was advised if they have not already done so to contact the registration department to sign all necessary forms in order for [place] release information regarding their care.   Consent: Patient/Guardian gives verbal consent for treatment and assignment of benefits for services provided during this visit. Patient/Guardian expressed understanding and agreed to proceed.   NMerian Capron MD 7/31/20239:22 AM

## 2021-10-20 ENCOUNTER — Ambulatory Visit (INDEPENDENT_AMBULATORY_CARE_PROVIDER_SITE_OTHER): Payer: Medicaid Other | Admitting: Licensed Clinical Social Worker

## 2021-10-20 DIAGNOSIS — F411 Generalized anxiety disorder: Secondary | ICD-10-CM | POA: Diagnosis not present

## 2021-10-20 DIAGNOSIS — F331 Major depressive disorder, recurrent, moderate: Secondary | ICD-10-CM

## 2021-10-20 NOTE — Progress Notes (Signed)
Virtual Visit via Video Note  I connected with Carol Wright on 10/20/21 at  8:00 AM EDT by a video enabled telemedicine application and verified that I am speaking with the correct person using two identifiers.  Location: Patient: home Provider: office   I discussed the limitations of evaluation and management by telemedicine and the availability of in person appointments. The patient expressed understanding and agreed to proceed.   I discussed the assessment and treatment plan with the patient. The patient was provided an opportunity to ask questions and all were answered. The patient agreed with the plan and demonstrated an understanding of the instructions.   The patient was advised to call back or seek an in-person evaluation if the symptoms worsen or if the condition fails to improve as anticipated.  I provided 45 minutes of non-face-to-face time during this encounter.  THERAPIST PROGRESS NOTE  Session Time: 8:00 AM to 8:45 AM  Participation Level: Active  Behavioral Response: CasualAlertappropriate  Type of Therapy: Individual Therapy  Treatment Goals addressed: decreased depression, decrease anxiety increase self-esteem, coping  ProgressTowards Goals: Progressing-began to work on strategies for overthinking as well as strategies for depression  Interventions: CBT, Solution Focused, Strength-based, Supportive, Reframing, and Other: Coping  Summary: Carol Wright is a 36 y.o. female who presents with has been good. Has had issues with controlling her thoughts have a thought and can't stop thinking about it. It will be random thoughts something happened 20 years ago something trigger the memory and keep replaying.  Something grandmother said the other day that mom said and that keeps replaying. First in session therapist looked at  depression handbook which is free online and will begin to work on some of the exercises to work on developing habits of positive thinking that will  help with mood.  Assigned patient to write down 3 good things that happen during the day even if small this will generate positive chemicals in her brain.  Looked at stop overthinking book as this is the source of issue patient presents in session.  Therapist introduced by saying overthinking is the effect not the real problem the problem is the anxiety.  Where we can intervene and 1 powerful determine or of whether we experience anxiety or not as are unique cognitive style or mental frames in the behavior that does inspiring [place].  At the interface between nature nurture is the story we tell about our lives the way we make sense of things her inner dialogue and our sense of her own identity.  Whether you feel an event as stressful and overwhelming comes down to how you interpret and understand the event as well as how you actively engaged with it what choices you make.  Noted rumination is not helpful so plan is to notice when she is in it to help find strategies out of it.  Introduced Adult nurse to challenge thoughts, also introduced from anxiety book how to get out of obsessive thinking how we get out of the spiral is by a deliberate active will into another mode of being such as talking to a friend listening to music physical exercise.  Noted this is 1 suggestion and will continue to work on suggestions and block as well as depression handbook.  Suicidal/Homicidal: No  Plan: Return again in 2 weeks.2.  Continue to look at stop overthinking book as well as depression handbook  Diagnosis: Major depressive disorder, moderate, recurrent, generalized anxiety disorder   Collaboration of Care: Other none needed  Patient/Guardian  was advised Release of Information must be obtained prior to any record release in order to collaborate their care with an outside provider. Patient/Guardian was advised if they have not already done so to contact the registration department to sign all necessary forms in  order for [place] to release information regarding their care.   Consent: Patient/Guardian gives verbal consent for treatment and assignment of benefits for services provided during this visit. Patient/Guardian expressed understanding and agreed to proceed.   Cordella Register, LCSW 10/20/2021

## 2021-10-27 ENCOUNTER — Other Ambulatory Visit: Payer: Self-pay | Admitting: Obstetrics & Gynecology

## 2021-10-27 MED ORDER — DOXYCYCLINE HYCLATE 100 MG PO CAPS: 100.0000 mg | ORAL_CAPSULE | Freq: Two times a day (BID) | ORAL | 0 refills | Status: DC

## 2021-10-27 NOTE — Progress Notes (Signed)
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

## 2021-11-03 ENCOUNTER — Ambulatory Visit (INDEPENDENT_AMBULATORY_CARE_PROVIDER_SITE_OTHER): Payer: Medicaid Other | Admitting: Licensed Clinical Social Worker

## 2021-11-03 DIAGNOSIS — F411 Generalized anxiety disorder: Secondary | ICD-10-CM

## 2021-11-03 DIAGNOSIS — F331 Major depressive disorder, recurrent, moderate: Secondary | ICD-10-CM | POA: Diagnosis not present

## 2021-11-03 NOTE — Progress Notes (Signed)
Virtual Visit via Video Note  I connected with Carol Wright on 11/03/21 at  8:00 AM EDT by a video enabled telemedicine application and verified that I am speaking with the correct person using two identifiers.  Location: Patient: home Provider: office   I discussed the limitations of evaluation and management by telemedicine and the availability of in person appointments. The patient expressed understanding and agreed to proceed.   I discussed the assessment and treatment plan with the patient. The patient was provided an opportunity to ask questions and all were answered. The patient agreed with the plan and demonstrated an understanding of the instructions.   The patient was advised to call back or seek an in-person evaluation if the symptoms worsen or if the condition fails to improve as anticipated.  I provided 53 minutes of non-face-to-face time during this encounter.  THERAPIST PROGRESS NOTE  Session Time: 9:00 AM to 9:53 AM  Participation Level: Active  Behavioral Response: CasualAlertEuthymic working on anxiety and depression  Type of Therapy: Individual Therapy  Treatment Goals addressed: decrease depression, decrease anxiety increase self-esteem, coping  ProgressTowards Goals: Progressing-working on mindfulness as emotional regulation strategy, exercises to help with depression  Interventions: Solution Focused, Strength-based, Supportive, Reframing, and Other: Coping  Summary: Carol Wright is a 36 y.o. female who presents with trying to work on not going into a spiral. Having a thought so not to go into overthinking asks if helping and if it is not try to move on. At least be more mindful that doing it would be thinking about it all day and didn't realize. Practiced three good things a day. Father came in from [place] because sister pregnant. Sister went into labor on Friday. Patient and Dad drove up on Thursday morning. That day Dad was here, sister gave birth and was  happy was aunt. When he is here it brings up things with Dad. Good when he was living here where he moved to [place] when retired it was fractured. Wanted him to be near family and patient wondered what they were then weren't they family? Hurt that he wanted to be close to family and there wasn't much family of his there. Tension in relationship blame on his wife did the move more for her. See post on Facebook going to parties for her family not spending time with his family but her family. He is still set on being close to family and for financial reasons.    Never going to see eye to eye he is set on what he thinks and she is set on what she thinks. So hurt at one point don't care but care for her kids they don't have grandparents. They have your Mom, fractured relationship with his Mom issues between them and they don't speak. Dad passed when 40. He brought up when girls get older could take for a summer. Wanted that "picture perfect" family. Grandparents were her parents wanted kids to have grandparents.   Reviewed homework with patient reminding herself of 3 good things noted to 3 examples were big things undoubtably help her feel better with her father visiting, sister having a baby, patient being an aunt undoubtably helpful for mood.  Delve more deeply into relationship with father why there are unresolved issues and heart feelings therapist attempted reframing talking about her girls visiting father for the summer would be a great opportunity at the same time validating patient on how she felt not having her kids have grandparents that were closer.  Worked  on patient understanding mindfulness nonjudgmental awareness, acceptance it helps her be more aware of thoughts and feelings that gives her space to intervene.  Noted this is a foundational skill for emotional regulation.  Looked at generalized anxiety packet to further explain qualities of mindfulness as well as 1 way to practice which is both deep  breathing, Checking in with her self asking herself what my experiencing right now what thoughts around what feelings around and what body sensations?  Lower self to just acknowledge observe and describe these experiences to herself without trying to change them or answer thoughts back.  Spent 30 seconds to 1 minute just doing this.  Then focus her attention on her breath focusing on the sensations of your breath as it moves back and forth in your belly finding your awareness to the back-and-forth movement of sensations in your belly from moment to moment and letting all thoughts go.  Maybe save yourself relaxer like go on every outward breath spend about 30 seconds to 1 minute doing this now expand her awareness is sensing her whole body breathing.  Being aware sensations throughout the body if there are any strong feelings around may be saying to herself what ever it is it is okay just let me feel it allowing herself to breathe with these feelings and if the mind wanders to bothersome thoughts just acknowledge and let go of these focusing back on sensing of breath continue doing this for 1 minute.  Therapist provided this example as a way to guide patient ways to unhook from ruminating thoughts.  Also showed information sheet from therapist aid the many benefits of mindfulness including helping depression anxiety, proved ability to manage emotions and further guided patient some practices for mindfulness.  Looked at depression handbook some motivational strategies to help her motivate to work on handbook also gave patient next assignment to think about her ideal self what the same time thinking of 3 positive things daily.  Therapist provided space and support for patient to talk about thoughts and feelings in session Suicidal/Homicidal: No  Plan: Return again in 6 weeks.2.  Patient do homework of looking at next assignment and depression handbook idealized self continue to think of 3 positive things a day,  continue with stop ruminating thoughts  Diagnosis: Major depressive disorder, moderate, recurrent, generalized anxiety disorder  Collaboration of Care: Other none needed  Patient/Guardian was advised Release of Information must be obtained prior to any record release in order to collaborate their care with an outside provider. Patient/Guardian was advised if they have not already done so to contact the registration department to sign all necessary forms in order for [place] to release information regarding their care.   Consent: Patient/Guardian gives verbal consent for treatment and assignment of benefits for services provided during this visit. Patient/Guardian expressed understanding and agreed to proceed.   Cordella Register, LCSW 11/03/2021

## 2021-11-10 ENCOUNTER — Telehealth (HOSPITAL_COMMUNITY): Payer: Medicaid Other | Admitting: Psychiatry

## 2021-11-10 ENCOUNTER — Telehealth (HOSPITAL_COMMUNITY): Payer: Self-pay | Admitting: Psychiatry

## 2021-11-10 ENCOUNTER — Encounter (HOSPITAL_COMMUNITY): Payer: Self-pay

## 2021-11-10 NOTE — Telephone Encounter (Signed)
Patient late to appointment and was on road. Advised to reschedule and she agrees

## 2021-11-20 ENCOUNTER — Other Ambulatory Visit: Payer: Self-pay | Admitting: Physician Assistant

## 2021-11-20 DIAGNOSIS — F411 Generalized anxiety disorder: Secondary | ICD-10-CM

## 2021-11-20 DIAGNOSIS — F53 Postpartum depression: Secondary | ICD-10-CM

## 2021-11-24 ENCOUNTER — Ambulatory Visit: Payer: Medicaid Other | Admitting: Physician Assistant

## 2021-11-24 ENCOUNTER — Telehealth: Payer: Self-pay | Admitting: Physician Assistant
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
## 2021-11-30 ENCOUNTER — Telehealth: Payer: Self-pay | Admitting: Neurology

## 2021-11-30 NOTE — Telephone Encounter (Signed)
Prior Authorization for [facility] submitted via covermymeds. Awaiting response.   [facility] has not replied to your PA request. Turnaround time for review of a PA request is dependent upon insurance plan and can range from 24 hours to 5 calendar days.  You may close this dialog, return to your dashboard, and perform other tasks. To check for an update later, click on the "[employer] Detailed Status" located in the upper right corner of your dashboard.  If this search option is not available or [facility] has not replied to your request within this timeframe, please contact the number on the back of the patient's insurance card.

## 2021-12-01 ENCOUNTER — Encounter: Payer: Self-pay | Admitting: Physician Assistant

## 2021-12-01 ENCOUNTER — Ambulatory Visit: Payer: Medicaid Other | Admitting: Physician Assistant

## 2021-12-01 VITALS — BP 136/88 | HR 85 | Ht 63.0 in | Wt 227.0 lb

## 2021-12-01 DIAGNOSIS — Z13220 Encounter for screening for lipoid disorders: Secondary | ICD-10-CM

## 2021-12-01 DIAGNOSIS — Z6841 Body Mass Index (BMI) 40.0 and over, adult: Secondary | ICD-10-CM | POA: Diagnosis not present

## 2021-12-01 DIAGNOSIS — F53 Postpartum depression: Secondary | ICD-10-CM

## 2021-12-01 DIAGNOSIS — Z1329 Encounter for screening for other suspected endocrine disorder: Secondary | ICD-10-CM | POA: Diagnosis not present

## 2021-12-01 DIAGNOSIS — R7989 Other specified abnormal findings of blood chemistry: Secondary | ICD-10-CM

## 2021-12-01 DIAGNOSIS — F411 Generalized anxiety disorder: Secondary | ICD-10-CM | POA: Diagnosis not present

## 2021-12-01 DIAGNOSIS — G479 Sleep disorder, unspecified: Secondary | ICD-10-CM | POA: Insufficient documentation

## 2021-12-01 DIAGNOSIS — I10 Essential (primary) hypertension: Secondary | ICD-10-CM

## 2021-12-01 DIAGNOSIS — Z0000 Encounter for general adult medical examination without abnormal findings: Secondary | ICD-10-CM

## 2021-12-01 DIAGNOSIS — Z23 Encounter for immunization: Secondary | ICD-10-CM

## 2021-12-01 DIAGNOSIS — F331 Major depressive disorder, recurrent, moderate: Secondary | ICD-10-CM | POA: Diagnosis not present

## 2021-12-01 MED ORDER — TRAZODONE HCL 50 MG PO TABS: 25.0000 mg | ORAL_TABLET | Freq: Every evening | ORAL | 2 refills | Status: DC | PRN

## 2021-12-01 MED ORDER — INSULIN PEN NEEDLE 31G X 6 MM MISC: 0 refills | Status: AC

## 2021-12-01 MED ORDER — SAXENDA 18 MG/3ML ~~LOC~~ SOPN: 3.0000 mg | PEN_INJECTOR | Freq: Every day | SUBCUTANEOUS | 0 refills | Status: AC

## 2021-12-01 MED ORDER — BUPROPION HCL ER (XL) 300 MG PO TB24: 300.0000 mg | ORAL_TABLET | Freq: Every day | ORAL | 1 refills | Status: DC

## 2021-12-01 NOTE — Progress Notes (Signed)
Established Patient Office Visit  Subjective   Patient ID: Carol Wright, female    DOB: 03/13/1986  Age: 36 y.o. MRN: [id number]  Chief Complaint  Patient presents with   Follow-up    HPI Pt is a 36 yo obese female with HTN, MDD, GAD who presents to the clinic for follow up.   Her depression and anxiety is a little better on wellbutrin. [place] not made any med changes. She is in counseling and that does seem to help. No SI/HC. She is on zoloft and wellbutrin. She is having some trouble staying asleep. She gets to sleep but wakes up around 2am and cannot go back to sleep.   Wanting to start wegovy for weight but there is a shortage on lower dosages and has not started. She is trying to stay active.   Taking BP at home and ranging in the 130/70s. No CP, palpitations, headaches or vision changes.      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   H/O: cesarean section 10/11/2016   Ventricular aneurysm 04/22/2018   Class 3 severe obesity due to excess calories with serious comorbidity and body mass index (BMI) of 40.0 to 44.9 in adult [facility]) 11/13/2019   History of 2 cesarean sections 10/07/2020   Family history of glaucoma in grandfather 11/11/2020   No energy 11/11/2020   Recent skin changes 11/11/2020   Coronary artery disease involving native coronary artery of native heart without angina pectoris 08/18/2021   Moderate episode of recurrent major depressive disorder ([place]) 12/01/2021   Trouble in sleeping 12/01/2021   Resolved Ambulatory Problems    Diagnosis Date Noted   Obesity (BMI 30-39.9) 06/20/2013   Maternal coronary artery disease affecting  pregnancy, antepartum 07/12/2013   Elevated blood pressure reading 06/22/2014   Sore throat 07/17/2014   Infertility management 05/06/2016   Supervision of high risk pregnancy, antepartum 06/10/2016   Chronic hypertension during pregnancy, antepartum 07/06/2016   Supervision of high risk pregnancy, antepartum 03/25/2020   Encounter for care and examination of mother immediately after delivery 10/07/2020   Past Medical History:  Diagnosis Date   Depression    H/O arterial dissection    History of pre-eclampsia    Hyperlipidemia    Myocardial infarction ([place]) 2013   PCOS (polycystic ovarian syndrome)    Pre-eclampsia        Objective:     BP 136/88   Pulse 85   Ht '5\' 3"'$  (1.6 m)   Wt 227 lb (103 kg)   SpO2 98%   BMI 40.21 kg/m  BP Readings from Last 3 Encounters:  12/01/21 136/88  10/06/21 (!) 165/103  08/18/21 140/85   Wt Readings from Last 3 Encounters:  12/01/21 227 lb (103 kg)  10/06/21 228 lb (103.4 kg)  08/18/21 233 lb (105.7 kg)    .Marland Kitchen    12/01/2021    9:06 AM 10/13/2021    9:11 AM 07/09/2021    4:25 PM 05/19/2021    9:43 AM 05/19/2021    9:28 AM  Depression screen PHQ 2/9  Decreased Interest 1   1 0  Down, Depressed, Hopeless '2   1 1  '$ PHQ - 2 Score 3  2 1  Altered sleeping 3   1   Tired, decreased energy 1   1   Change in appetite 3   1   Feeling bad or failure about yourself  1   1   Trouble concentrating 1   2   Moving slowly or fidgety/restless 0   1   Suicidal thoughts 1   0   PHQ-9 Score 13   9   Difficult doing work/chores Not difficult at all   Not difficult at all      Information is confidential and restricted. Go to Review Flowsheets to unlock data.   ..    12/01/2021    9:07 AM 05/19/2021    9:43 AM 11/11/2020    9:49 AM 09/30/2020    3:29 PM  GAD 7 : Generalized Anxiety Score  Nervous, Anxious, on Edge '1 1 2 '$ 0  Control/stop worrying '2 2 3 '$ 0  Worry too much - different things '2 2 3 '$ 0  Trouble relaxing '1 2 2 '$ 0  Restless 0 2 0 0  Easily  annoyed or irritable 0 3 3 0  Afraid - awful might happen 0 0 0 0  Total GAD 7 Score '6 12 13 '$ 0  Anxiety Difficulty Somewhat difficult Somewhat difficult Somewhat difficult       Physical Exam Vitals reviewed.  Constitutional:      Appearance: Normal appearance.  HENT:     Head: Normocephalic.  Cardiovascular:     Rate and Rhythm: Normal rate and regular rhythm.     Pulses: Normal pulses.     Heart sounds: Normal heart sounds.  Pulmonary:     Effort: Pulmonary effort is normal.     Breath sounds: Normal breath sounds.  Neurological:     General: No focal deficit present.     Mental Status: She is alert and oriented to person, place, and time.  Psychiatric:        Mood and Affect: Mood normal.          Assessment & Plan:  Marland KitchenMarland KitchenLaurey was seen today for follow-up.  Diagnoses and all orders for this visit:  Essential hypertension -     COMPLETE METABOLIC PANEL WITH GFR  Routine physical examination -     TSH -     Lipid Panel w/reflex Direct LDL -     COMPLETE METABOLIC PANEL WITH GFR -     CBC with Differential/Platelet -     Vitamin D (25 hydroxy)  Low vitamin D level -     Vitamin D (25 hydroxy)  Thyroid disorder screen -     TSH  Lipid screening -     Lipid Panel w/reflex Direct LDL  Post-partum depression  Generalized anxiety disorder -     buPROPion (WELLBUTRIN XL) 300 MG 24 hr tablet; Take 1 tablet (300 mg total) by mouth daily.  Class 3 severe obesity due to excess calories with serious comorbidity and body mass index (BMI) of 40.0 to 44.9 in adult (HCC) -     Liraglutide -Weight Management (SAXENDA) 18 MG/3ML SOPN; Inject 3 mg into the skin daily. 0.6 mg inj subcut daily for 1 week, then incr by 0.6 mg weekly until reaching 3 mg injected subcut daily -     Insulin Pen Needle 31G X 6 MM MISC; To use with saxenda pen.  Flu vaccine need -     Flu Vaccine QUAD 88moIM (Fluarix, Fluzone & Alfiuria Quad PF)  Moderate episode of recurrent major  depressive disorder (HCC) -     buPROPion (WELLBUTRIN XL) 300 MG 24 hr tablet; Take 1 tablet (300 mg total) by mouth daily.  Trouble in sleeping -     traZODone (DESYREL) 50 MG tablet; Take 0.5-1 tablets (25-50 mg total) by mouth at bedtime as needed for sleep.   Labs ordered. Increased wellbutrin to '300mg'$  daily Continue zoloft Continue counseling Added trazodone for sleep   Sent saxenda for first month to get to higher doses of wegovy Follow up in 3 months.   BP looks good.  Continue same medications.    Return in about 3 months (around 03/02/2022).    Iran Planas, PA-C

## 2021-12-02 LAB — LIPID PANEL W/REFLEX DIRECT LDL
Cholesterol: 154 mg/dL (ref <200)
HDL: 41 mg/dL — ABNORMAL LOW (ref >=50)
LDL Cholesterol (Calc): 95 mg/dL (calc)
Non-HDL Cholesterol (Calc): 113 mg/dL (calc) (ref <130)
Total CHOL/HDL Ratio: 3.8 (calc) (ref <5.0)
Triglycerides: 89 mg/dL (ref <150)

## 2021-12-02 LAB — COMPLETE METABOLIC PANEL WITH GFR
AG Ratio: 1.7 (calc) (ref 1.0–2.5)
ALT: 24 U/L (ref 6–29)
AST: 19 U/L (ref 10–30)
Albumin: 4.5 g/dL (ref 3.6–5.1)
Alkaline phosphatase (APISO): 74 U/L (ref 31–125)
BUN: 13 mg/dL (ref 7–25)
CO2: 28 mmol/L (ref 20–32)
Calcium: 8.9 mg/dL (ref 8.6–10.2)
Chloride: 102 mmol/L (ref 98–110)
Creat: 0.61 mg/dL (ref 0.50–0.97)
Globulin: 2.7 g/dL (calc) (ref 1.9–3.7)
Glucose, Bld: 93 mg/dL (ref 65–99)
Potassium: 3.8 mmol/L (ref 3.5–5.3)
Sodium: 137 mmol/L (ref 135–146)
Total Bilirubin: 1.2 mg/dL (ref 0.2–1.2)
Total Protein: 7.2 g/dL (ref 6.1–8.1)
eGFR: 117 mL/min/{1.73_m2} (ref >=60)

## 2021-12-02 LAB — CBC WITH DIFFERENTIAL/PLATELET
Absolute Monocytes: 753 cells/uL (ref 200–950)
Basophils Absolute: 85 cells/uL (ref 0–200)
Basophils Relative: 0.8 %
Eosinophils Absolute: 339 cells/uL (ref 15–500)
Eosinophils Relative: 3.2 %
HCT: 39.2 % (ref 35.0–45.0)
Hemoglobin: 13.5 g/dL (ref 11.7–15.5)
Lymphs Abs: 2120 cells/uL (ref 850–3900)
MCH: 29.4 pg (ref 27.0–33.0)
MCHC: 34.4 g/dL (ref 32.0–36.0)
MCV: 85.4 fL (ref 80.0–100.0)
MPV: 11.1 fL (ref 7.5–12.5)
Monocytes Relative: 7.1 %
Neutro Abs: 7303 cells/uL (ref 1500–7800)
Neutrophils Relative %: 68.9 %
Platelets: 229 10*3/uL (ref 140–400)
RBC: 4.59 10*6/uL (ref 3.80–5.10)
RDW: 13.9 % (ref 11.0–15.0)
Total Lymphocyte: 20 %
WBC: 10.6 10*3/uL (ref 3.8–10.8)

## 2021-12-02 LAB — TSH: TSH: 2.14 mIU/L

## 2021-12-02 LAB — VITAMIN D 25 HYDROXY (VIT D DEFICIENCY, FRACTURES): Vit D, 25-Hydroxy: 21 ng/mL — ABNORMAL LOW (ref 30–100)

## 2021-12-02 NOTE — Progress Notes (Signed)
Kimbra,   Kidney, liver, glucose look great.  Hemoglobin and WBC look good.  Thyroid looks good.  LDL under 100 which is great.  HDL still not to the 50 goal. Increase exercise and good fats to help with this.  Vitamin D not to goal. Start at least 2000 units daily.

## 2021-12-07 ENCOUNTER — Other Ambulatory Visit (HOSPITAL_COMMUNITY): Payer: Self-pay | Admitting: Psychiatry

## 2021-12-07 DIAGNOSIS — F53 Postpartum depression: Secondary | ICD-10-CM

## 2021-12-07 DIAGNOSIS — F411 Generalized anxiety disorder: Secondary | ICD-10-CM

## 2021-12-08 ENCOUNTER — Telehealth: Payer: Self-pay

## 2021-12-08 NOTE — Telephone Encounter (Signed)
Initiated Prior authorization [id number] 0.'25MG'$ /0.5ML auto-injectors Via: Covermymeds Case/Key:[id number] Status: denied as of 12/08/21 Reason:no reason indicated  Notified Pt via: Mychart ,Submission from another user

## 2021-12-08 NOTE — Telephone Encounter (Addendum)
Initiated Prior authorization [id number] 18MG/3ML pen-injectors Via: Covermymeds Case/Key:[id number] Status: denied as of 12/08/21 Reason:medical criteria not met/Weight loss medications are excluded from the pt's benefit plan Notified Pt via: Mychart

## 2021-12-15 ENCOUNTER — Ambulatory Visit (INDEPENDENT_AMBULATORY_CARE_PROVIDER_SITE_OTHER): Payer: Medicaid Other | Admitting: Licensed Clinical Social Worker

## 2021-12-15 DIAGNOSIS — F331 Major depressive disorder, recurrent, moderate: Secondary | ICD-10-CM | POA: Diagnosis not present

## 2021-12-15 DIAGNOSIS — F411 Generalized anxiety disorder: Secondary | ICD-10-CM

## 2021-12-15 NOTE — Progress Notes (Signed)
Virtual Visit via Video Note  I connected with Carol Wright on 12/15/21 at  8:00 AM EDT by a video enabled telemedicine application and verified that I am speaking with the correct person using two identifiers.  Location: Patient: home Provider: office   I discussed the limitations of evaluation and management by telemedicine and the availability of in person appointments. The patient expressed understanding and agreed to proceed.   I discussed the assessment and treatment plan with the patient. The patient was provided an opportunity to ask questions and all were answered. The patient agreed with the plan and demonstrated an understanding of the instructions.   The patient was advised to call back or seek an in-person evaluation if the symptoms worsen or if the condition fails to improve as anticipated.  I provided 52 minutes of non-face-to-face time during this encounter.  THERAPIST PROGRESS NOTE  Session Time: 8:00 AM to 8:52 AM  Participation Level: Active  Behavioral Response: CasualAlertAnxious and Depressed  Type of Therapy: Individual Therapy  Treatment Goals addressed: ecrease depression, decrease anxiety increase self-esteem, coping  ProgressTowards Goals: Progressing-patient reports intensity of symptoms of next week but doing better this week utilizing therapy to process feelings, problem solving, encouraging self-care options to gain some progress with symptoms  Interventions: Solution Focused, Strength-based, Supportive, and Other: Coping  Summary: Carol Wright is a 36 y.o. female who presents with last week was rough week but ok this week. Anxiety kicked in and the ruminating thoughts kicked in and felt overwhelmed with life in general with kids, husband, family, felt tired. Asked husband to remove the firearm all wanted to do was go to sleep and sleep and sleep.  Move from one task to another no break. Start a task and go to another one. A little anger do a lot and  try to help everybody. Hearing from grandmother that mom talks negatively about patient and husband. She says she is helping grandparents because looking for a larger inheritance. Mom says patient's husband is conniving sneak trying to get things from the family. Hard because it is her Mom.  Grandmother tells her these things to be cautious around mother and not trust her. Therapist explored with patient how she wants to deal with it. Mom doesn't help and doesn't respect her as a mother. Explored saying something to her. Husband is more tell it like is and doesn't like Mom. He gets upset with patient arguments at home no matter what patient loses. Husband upset with patient because she doesn't say back off, but patient feels helping grandparents is her responsibility. No matter what does someone upset and decisions wrong.  Explored mom helping her more and patient relates mom selfish always been no expectation will help her more help decrease patient's responsibilities.No time for self-care. When wants to take something for herself inconvenience for somebody else. Explored action to change current situation so that would help her be happier. Patient says if try there is push backs. Go to husband and say she is tired. He says understands but doesn't. No matter what doesn't win. Doesn't get grace from life in general. Why last week told husband tired and anxiety kicking up. Original plan not to get married and have kids. Loves gardening can't even get 5 minutes. Did go to self-care section and bought books. "The Shadow workbook Journal". Exercises for healing trauma and exploring your hidden self."Anxiety handbook practical tips and guiding exercises to overcome anxiety. Happiness journal. Book of overthinking how to stop the cycle  of worry."  Therapist assesses positive step patient took to work on treatment goals.  Processed patient's feelings, as a strategy of coping with anxiety and depression putting words to  her experience helps with coping additionally validating her experience of feeling overwhelmed.  Engaged in problem solving to address the issue which includes setting boundaries with husband and mom as we explored seems challenging in patient's life to find a place she can do that.  Encouraged self-care is necessary for management of mental health even off on a smaller scale.  Patient agrees to start walking.  Therapist encouraged patient to bring husband to next session to help in planning for patient's self-care.  Patient verbalizes life not being the way she planned therapist pointing out often not in control of a lot of things outside herself but what active steps can she take where she would feel better in her life.  Therapist provided active listening open questions supportive interventions. Suicidal/Homicidal: No  Plan: Return again in 2 weeks.2.  Therapist guided patient in carving out time to do self-care this point patient agreeing to walk and also has about books for self-care. 3.  Look at ruminating thoughts book and depression handbook, look at should statements from self therapy and saying something out loud as strategy for working on making changes  Diagnosis: Major depressive disorder, moderate, recurrent, generalized anxiety disorder  Collaboration of Care: Other none needed  Patient/Guardian was advised Release of Information must be obtained prior to any record release in order to collaborate their care with an outside provider. Patient/Guardian was advised if they have not already done so to contact the registration department to sign all necessary forms in order for [place] to release information regarding their care.   Consent: Patient/Guardian gives verbal consent for treatment and assignment of benefits for services provided during this visit. Patient/Guardian expressed understanding and agreed to proceed.   Cordella Register, LCSW 12/15/2021

## 2021-12-29 ENCOUNTER — Ambulatory Visit (INDEPENDENT_AMBULATORY_CARE_PROVIDER_SITE_OTHER): Payer: Medicaid Other | Admitting: Licensed Clinical Social Worker

## 2021-12-29 DIAGNOSIS — F331 Major depressive disorder, recurrent, moderate: Secondary | ICD-10-CM

## 2021-12-29 DIAGNOSIS — F411 Generalized anxiety disorder: Secondary | ICD-10-CM

## 2021-12-29 NOTE — Progress Notes (Signed)
Virtual Visit via Video Note  I connected with [name] on 12/29/21 at  9:00 AM EDT by a video enabled telemedicine application and verified that I am speaking with the correct person using two identifiers.  Location: Patient: home Provider: office   I discussed the limitations of evaluation and management by telemedicine and the availability of in person appointments. The patient expressed understanding and agreed to proceed.   I discussed the assessment and treatment plan with the patient. The patient was provided an opportunity to ask questions and all were answered. The patient agreed with the plan and demonstrated an understanding of the instructions.   The patient was advised to call back or seek an in-person evaluation if the symptoms worsen or if the condition fails to improve as anticipated.  I provided 52 minutes of non-face-to-face time during this encounter.  THERAPIST PROGRESS NOTE  Session Time: 9:00 AM to 9:52 AM  Participation Level: Active  Behavioral Response: CasualAlertDysphoric  Type of Therapy: Individual Therapy  Treatment Goals addressed: decrease depression, decrease anxiety increase self-esteem, coping  ProgressTowards Goals: Progressing-patient reports symptoms and utilizing CBT and processing feelings to learn coping skills to help decrease symptoms  Interventions: CBT, Solution Focused, Strength-based, and Other: coping  Summary: Keshana Klemz is a 36 y.o. female who presents with no break and like ground hog day. Not able to get to walking.  Therapist began to work with patient on looking at her schedule and putting in activities that give her pleasure, mastery, value-based.  Noted patient's schedule is full so to the extent possible begin to add these activities noted patient has time in the mornings on weekends Sunday no schedule patient still wakes up early. Watch videos and Facebook and said could do something she enjoys then. Watch video of  people doing things it and want to do it.  This would be a way for her to begin to do some of the things she enjoys. She likes reading. At night spend quality time husband but patient falls asleep 10 after minutes.  Can use some of this time certain days a week to add positive activities.  Asked patient for feedback on what she thought about looking and challenging automatic thoughts and patient said she cannot say with her automatic thought process is what she feels. Something happens always is using her evaluation and perspective don't see what it is just have a negative thought.  It would be helpful for her to begin to notice these negative thoughts.  Therapist looked at book "thoughts and feelings taking control of your moods in your life" to work with patient on treatment interventions.  Looked at chapter on getting mobilized.  Guided patient in looking at her schedule for the week and rating activities that give her pleasure, mastery, and value based.  The goal is to increase these activities in her schedule.  Reviewed with patient her schedule noted Sunday morning is a good time as well as nights with her husband not absolutely essential she can engage in activities that can be rated in 1 of these categories.  Noted to list for herself activities that are under these categories for herself.  Noted in session reading but to continue think about activities and start to put them in her schedule as often as possible at least daily.  She wants to notice from 0-10 level of enjoyment she gets writes them so she can make changes were needed.  She then is going to project her level of  pleasure master values fulfillment for new activities.  Often when people are depressed they under predict how much they will enjoy them.  Then compare actual level of pleasurable mastery her values for filament for predictions.  Therapist shared weekly activity schedule is crucial intervention for overcoming depression so she may need  to limit or suspend some of her usual activities.  Therapist looked at chapter 2 "uncovering automatic thoughts" therapist shared nature of automatic thoughts can happen so quickly we do not always notice them, they can be idiosyncratic, they are learned.  Noted feedback loops 1 can fall into.  Patient is going to work on skill of noticing automatic thought when she has a strong feeling rating the feeling and noticing the thought.  Noted this is helpful strategy as patient herself says that she reviews things based on her perspective and this will help her to notice more perspective so that she can challenge and change it. Suicidal/Homicidal: No  Plan: Return again in 2 weeks.2.  Patient begin to put mastery, pleasurable value-based activities and schedule rate from 0-10 how much they provided these things, predict and then rate actual experience.3.  Patient begin to notice automatic thoughts when she has intense feelings so she becomes more aware of thoughts that are feeding her emotions.4.  Continue with identifying what she is grateful for  Diagnosis: Major depressive disorder, moderate, recurrent, generalized anxiety disorder  Collaboration of Care: Other none needed  Patient/Guardian was advised Release of Information must be obtained prior to any record release in order to collaborate their care with an outside provider. Patient/Guardian was advised if they have not already done so to contact the registration department to sign all necessary forms in order for [place] to release information regarding their care.   Consent: Patient/Guardian gives verbal consent for treatment and assignment of benefits for services provided during this visit. Patient/Guardian expressed understanding and agreed to proceed.   Cordella Register, LCSW 12/29/2021

## 2022-01-12 ENCOUNTER — Ambulatory Visit (HOSPITAL_COMMUNITY): Payer: Medicaid Other | Admitting: Licensed Clinical Social Worker

## 2022-01-12 ENCOUNTER — Encounter (HOSPITAL_COMMUNITY): Payer: Self-pay | Admitting: Psychiatry

## 2022-01-12 ENCOUNTER — Telehealth (INDEPENDENT_AMBULATORY_CARE_PROVIDER_SITE_OTHER): Payer: Medicaid Other | Admitting: Psychiatry

## 2022-01-12 DIAGNOSIS — F53 Postpartum depression: Secondary | ICD-10-CM | POA: Diagnosis not present

## 2022-01-12 DIAGNOSIS — F411 Generalized anxiety disorder: Secondary | ICD-10-CM

## 2022-01-12 DIAGNOSIS — F331 Major depressive disorder, recurrent, moderate: Secondary | ICD-10-CM | POA: Diagnosis not present

## 2022-01-12 MED ORDER — SERTRALINE HCL 100 MG PO TABS: 100.0000 mg | ORAL_TABLET | Freq: Every day | ORAL | 0 refills | Status: DC

## 2022-01-12 NOTE — Progress Notes (Signed)
[place] Follow up visit  Patient Identification: Carol Wright MRN:  [id number] Date of Evaluation:  01/12/2022 Referral Source: [facility] Counsellor Chief Complaint:   No chief complaint on file. Follow up depression, post partum Visit Diagnosis:    ICD-10-CM   1. Major depressive disorder, recurrent episode, moderate (HCC)  F33.1     2. Generalized anxiety disorder  F41.1 sertraline (ZOLOFT) 100 MG tablet    3. 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 Post-partum depression  F53.0 sertraline (ZOLOFT) 100 MG tablet     Virtual Visit via Video Note  I connected with Carol Wright on 01/12/22 at 10:30 AM EDT by a video enabled telemedicine application and verified that I am speaking with the correct person using two identifiers.  Location: Patient: parked car Provider: home office   I discussed the limitations of evaluation and management by telemedicine and the availability of in person appointments. The patient expressed understanding and agreed to proceed.     I discussed the assessment and treatment plan with the patient. The patient was provided an opportunity to ask questions and all were answered. The patient agreed with the plan and demonstrated an understanding of the instructions.   The patient was advised to call back or seek an in-person evaluation if the symptoms worsen or if the condition fails to improve as anticipated.  I provided 15 minutes of non-face-to-face time during this encounter including chart review, documentation   History of Present Illness: Patient is a 36 years old currently married female she has 3 kids youngest is 37 years of age she is a homemaker.  Referred initially by primary care physician on-call start to establish for her depression and postpartum possible depression  Patient endorses having episodes of depression during her adult life.  She has had postpartum depression 10 years ago that she recovered with Wellbutrin.  She has now a baby 1 year of  age and was started on sertraline for depression added Wellbutrin couple of months   Last visit increased zoloft to '100mg'$  has helped. Doing fair on meds, grand pa has dementia   Also in therapy, bonding and stressors are manageable     Aggravating factors: taking care of grand parents, mom doesn't take much responsibility, grand pa has dementia  Modifying factor: marriage, kids Duration adult life Severity fair Hospital admission denies Drug use denies   Past Psychiatric History: depression  Previous Psychotropic Medications: Yes   Substance Abuse History in the last 12 months:  No.  Consequences of Substance Abuse: NA  Past Medical History:  Past Medical History:  Diagnosis Date   Depression    Generalized anxiety disorder    H/O arterial dissection    Hepatic steatosis    Via ultrasound.     History of pre-eclampsia    Hyperlipidemia    Myocardial infarction ([place]) 2013   Obesity (BMI 30-39.9)    PCOS (polycystic ovarian syndrome)    Pre-eclampsia    Spontaneous dissection of coronary artery    a. occurred in 2013 during pregnany. Underwent placement of BMS per the patient's report.     Past Surgical History:  Procedure Laterality Date   Arterial disection  03/17/2011   Bare metal stent  03/17/2011   CESAREAN SECTION  03/17/2011   CESAREAN SECTION N/A 10/07/2020   Procedure: CESAREAN SECTION;  Surgeon: Clarnce Flock, MD;  Location: [place];  Service: Obstetrics;  Laterality: N/A;   cholcyst     CHOLECYSTECTOMY  Family Psychiatric History: sister: depression  Family History:  Family History  Problem Relation Age of Onset   Hypertension Mother    Hypertension Maternal Grandmother    Heart attack Maternal Grandfather     Social History:   Social History   Socioeconomic History   Marital status: Married    Spouse name: Not on file   Number of children: 1   Years of education: Not on file   Highest education level: Not on file  Occupational  History    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  Other Topics Concern   Not on file  Social History Narrative   Not on file   Social Determinants of Health   Financial Resource Strain: Not on file  Food Insecurity: No Food Insecurity (09/05/2020)   Hunger Vital Sign    Worried About Running Out of Food in the Last Year: Never true    Ran Out of Food in the Last Year: Never true  Transportation Needs: No Transportation Needs (09/05/2020)   PRAPARE - Hydrologist (Medical): No    Lack of Transportation (Non-Medical): No  Physical Activity: Not on file  Stress: Not on file  Social Connections: Not on file     Allergies:   Allergies  Allergen Reactions   Ace Inhibitors     cough    Metabolic Disorder Labs: Lab Results  Component Value Date   HGBA1C 4.5 03/25/2020   MPG 82 03/25/2020   MPG 82 11/27/2019   No results found for: "PROLACTIN" Lab Results  Component Value Date   CHOL 154 12/01/2021   TRIG 89 12/01/2021   HDL 41 (L) 12/01/2021   CHOLHDL 3.8 12/01/2021   VLDL 32 06/20/2013   LDLCALC 95 12/01/2021   LDLCALC 128 (H) 11/11/2020   Lab Results  Component Value Date   TSH 2.14 12/01/2021    Therapeutic Level Labs: No results found for: "LITHIUM" No results found for: "CBMZ" No results found for: "VALPROATE"  Current Medications: Current Outpatient Medications  Medication Sig Dispense Refill   amLODipine (NORVASC) 5 MG tablet Take 5 mg by mouth daily.     aspirin 81 MG chewable tablet Chew by mouth daily.     buPROPion (WELLBUTRIN XL) 300 MG 24 hr tablet Take 1 tablet (300 mg total) by mouth daily. 90 tablet 1   cetirizine (ZYRTEC) 10 MG tablet Take 10 mg by mouth daily.     chlorthalidone (HYGROTON) 25 MG  tablet Take 25 mg by mouth daily.     Insulin Pen Needle 31G X 6 MM MISC To use with saxenda pen. 60 each 0   levonorgestrel (LILETTA, 52 MG,) 20.1 MCG/DAY IUD 1 each by Intrauterine route once.     Liraglutide -Weight Management (SAXENDA) 18 MG/3ML SOPN Inject 3 mg into the skin daily. 0.6 mg inj subcut daily for 1 week, then incr by 0.6 mg weekly until reaching 3 mg injected subcut daily 15 mL 0   metFORMIN (GLUCOPHAGE) 500 MG tablet Take 1 tablet (500 mg total) by mouth 2 (two) times daily with a meal. 180 tablet 1   sertraline (ZOLOFT) 100 MG tablet Take 1 tablet (100 mg total) by mouth daily. [place]  tablet 0   traZODone (DESYREL) 50 MG tablet Take 0.5-1 tablets (25-50 mg total) by mouth at bedtime as needed for sleep. 30 tablet 2   WEGOVY 0.25 MG/0.5ML SOAJ Inject 0.25 mg into the skin once a week. Use this dose for 1 month (4 shots) and then increase to next higher dose. (Patient not taking: Reported on 12/01/2021) 2 mL 0   WEGOVY 0.5 MG/0.5ML SOAJ Inject 0.5 mg into the skin once a week. Use this dose for 1 month (4 shots) and then increase to next higher dose. (Patient not taking: Reported on 12/01/2021) 2 mL 1   No current facility-administered medications for this visit.     Psychiatric Specialty Exam: Review of Systems  Cardiovascular:  Negative for chest pain.  Neurological:  Negative for tremors.  Psychiatric/Behavioral:  Negative for agitation.     unknown if currently breastfeeding.There is no height or weight on file to calculate BMI.  General Appearance: Casual  Eye Contact:  Fair  Speech:  Normal Rate  Volume:  Decreased  Mood:  fair  Affect:  Constricted  Thought Process:  Goal Directed  Orientation:  Full (Time, Place, and Person)  Thought Content:  Rumination  Suicidal Thoughts:  No  Homicidal Thoughts:  No  Memory:  Immediate;   Fair  Judgement:  Fair  Insight:  Fair  Psychomotor Activity:  Decreased  Concentration:  Concentration: Fair  Recall:  [employer] of  [employer]: Fair  Akathisia:  No  Handed:    AIMS (if indicated):  not done  Assets:  Financial Resources/Insurance Housing Physical Health  ADL's:  Intact  Cognition: WNL  Sleep:   irregular at times   Screenings: [place] Office Visit from 12/01/2021 in [place] Office Visit from 05/19/2021 in [place] Office Visit from 11/11/2020 in [place] from 09/30/2020 in [facility] [place] [facility] Clinical Support from 09/12/2020 in [facility] [place] [facility]  Total GAD-7 Score '6 12 13 '$ 0 0      PHQ2-9    [place] Visit from 12/01/2021 in [place] Video Visit from 10/13/2021 in [place] Counselor from 07/09/2021 in [place] Office Visit from 05/19/2021 in [place] Visit from 11/11/2020 in [place]  PHQ-2 Total Score '3 2 3 2 3  '$ PHQ-9 Total Score '13 10 18 9 16      '$ Flowsheet Row Video Visit from 01/12/2022 in [place] Video Visit from 10/13/2021 in [place] Counselor from 07/09/2021 in [place] No Risk No Risk Low Risk       Assessment and Plan: as follows  MDD recurrent moderate:improved continue zoloft '100mg'$  GAD: manageable continue zoloft Post partum depression: doing fair with zoloft and wellbutrin  Fu 55m    Collaboration of Care: Other primary care and counsellor notes and meds reviewed  Patient/Guardian was advised Release of Information must be obtained prior to any record release in order  to collaborate their care with an outside provider. Patient/Guardian was advised if they have not already done so to contact the registration department to sign  all necessary forms in order for [place] to release information regarding their care.   Consent: Patient/Guardian gives verbal consent for treatment and assignment of benefits for services provided during this visit. Patient/Guardian expressed understanding and agreed to proceed.   Merian Capron, MD 10/30/202310:37 AM

## 2022-01-26 ENCOUNTER — Ambulatory Visit (INDEPENDENT_AMBULATORY_CARE_PROVIDER_SITE_OTHER): Payer: Medicaid Other | Admitting: Licensed Clinical Social Worker

## 2022-01-26 DIAGNOSIS — F331 Major depressive disorder, recurrent, moderate: Secondary | ICD-10-CM

## 2022-01-26 DIAGNOSIS — F411 Generalized anxiety disorder: Secondary | ICD-10-CM | POA: Diagnosis not present

## 2022-01-26 NOTE — Progress Notes (Signed)
Virtual Visit via Video Note  I connected with Meganne Rita on 01/26/22 at  9:00 AM EST by a video enabled telemedicine application and verified that I am speaking with the correct person using two identifiers.  Location: Patient: in car husband driving Provider: office   I discussed the limitations of evaluation and management by telemedicine and the availability of in person appointments. The patient expressed understanding and agreed to proceed.   I discussed the assessment and treatment plan with the patient. The patient was provided an opportunity to ask questions and all were answered. The patient agreed with the plan and demonstrated an understanding of the instructions.   The patient was advised to call back or seek an in-person evaluation if the symptoms worsen or if the condition fails to improve as anticipated.  I provided 52 minutes of non-face-to-face time during this encounter.   THERAPIST PROGRESS NOTE  Session Time: 9:00 AM to 9:52 AM  Participation Level: Active  Behavioral Response: CasualAlertDysphoric and Irritable  Type of Therapy: Individual Therapy  Treatment Goals addressed: decrease depression, decrease anxiety increase self-esteem, coping  ProgressTowards Goals: Progressing Using CBT to help patient with depressive symptoms specifically looking at thoughts escalating her symptoms will continue to do this while educating more about CBT Interventions: CBT, Motivational Interviewing, Strength-based, Supportive, Reframing, and Other: Coping  Summary: Kimmarie Pascale is a 36 y.o. female who presents with thought would do something nice for herself. Make up and hair done. Cleaned play room seeing it as functional room made her happy. Things up and down good and bad days. Therapist work with patient on chapter 2 of "thoughts and feeling workbook" titled.  "Uncovering automatic thoughts.  Therapist emphasized the significant concept thoughts cuts feelings this is  the essential insight of cognitive therapy.  Reviewed the nature of automatic thoughts for patient better able to identify, explaining you are constantly describing the role to yourself giving each event or experience some label.  These labels and judgments or fashion from the on ending dialogue you have with yourself.  Thoughts are constant and rarely notice however they are powerful enough to create your most intense feelings.  Noted nature which can make it hard to identify including they often appear in short hand, they are almost always believed, they are experienced as spontaneous they are often counseled in terms of should auto must, they tend to awfulize, they are relatively idiosyncratic, they are persisting and self-perpetuating they often differ from persons public statements, they repeat certain things.  Patient agreed for her being depressed focused on the past and obsess about the theme of loss.  For her also with a focus on her own feelings and flaws.  Noted knowing this we have to take counter and challenge as these are stuck points.  Therapist suggested ways such as focusing on the present which she can do now to improve the present, also what she is grateful for.  Automatic thoughts are learned.  Therapist work with patient on recording these thoughts she has an intense emotion.  Worked with her on a specific situation with mom who is in [place] telling her she needs a break which is frustrating and makes patient angry as she does very little.  Patient able to counter herself by saying she will miss out with time with family.  Therapist encouraged patient to write the thoughts down as therapist assesses will make progress when we slow the process down see the thoughts encounter them.  Therapist guided patient in  countering her mom's perspective but also noted may have limited success as mom has limited insight.  Therapist suggested make it short and sweet countering but also keep in mind negative  repercussions that will come to her mom from her choices.  Her selfishness will impact her relationships.  In summary slowing these thoughts down stretching the short handout necessary to identify your entire internal process in order to understand the distorted logic from which many painful emotions bloom.   Suicidal/Homicidal: No  Plan: Return again in 2 weeks.2.  Work on negative thoughts leading to depression, look at next chapter and depression workbook  Diagnosis:  Major depressive disorder, moderate, recurrent, generalized anxiety disorder  Collaboration of Care: Medication Management AEB review of Dr. De Nurse note  Patient/Guardian was advised Release of Information must be obtained prior to any record release in order to collaborate their care with an outside provider. Patient/Guardian was advised if they have not already done so to contact the registration department to sign all necessary forms in order for [place] to release information regarding their care.   Consent: Patient/Guardian gives verbal consent for treatment and assignment of benefits for services provided during this visit. Patient/Guardian expressed understanding and agreed to proceed.   Cordella Register, LCSW 01/26/2022

## 2022-02-09 ENCOUNTER — Ambulatory Visit (INDEPENDENT_AMBULATORY_CARE_PROVIDER_SITE_OTHER): Payer: Medicaid Other | Admitting: Licensed Clinical Social Worker

## 2022-02-09 DIAGNOSIS — F331 Major depressive disorder, recurrent, moderate: Secondary | ICD-10-CM | POA: Diagnosis not present

## 2022-02-09 DIAGNOSIS — F411 Generalized anxiety disorder: Secondary | ICD-10-CM
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
## 2022-02-23 ENCOUNTER — Ambulatory Visit (INDEPENDENT_AMBULATORY_CARE_PROVIDER_SITE_OTHER): Payer: Medicaid Other | Admitting: Licensed Clinical Social Worker

## 2022-02-23 DIAGNOSIS — F331 Major depressive disorder, recurrent, moderate: Secondary | ICD-10-CM | POA: Diagnosis not present

## 2022-02-23 DIAGNOSIS — F411 Generalized anxiety disorder: Secondary | ICD-10-CM

## 2022-02-23 NOTE — Progress Notes (Signed)
Virtual Visit via Video Note  I connected with Carol Wright on 02/23/22 at  9:00 AM EST by a video enabled telemedicine application and verified that I am speaking with the correct person using two identifiers.  Location: Patient: stationary car Provider: office   I discussed the limitations of evaluation and management by telemedicine and the availability of in person appointments. The patient expressed understanding and agreed to proceed.  I discussed the assessment and treatment plan with the patient. The patient was provided an opportunity to ask questions and all were answered. The patient agreed with the plan and demonstrated an understanding of the instructions.   The patient was advised to call back or seek an in-person evaluation if the symptoms worsen or if the condition fails to improve as anticipated.  I provided 52 minutes of non-face-to-face time during this encounter.   THERAPIST PROGRESS NOTE  Session Time: 9:00 AM to 9:52 AM  Participation Level: Active  Behavioral Response: CasualAlertappropriate  Type of Therapy: Individual Therapy  Treatment Goals addressed: decrease depression, decrease anxiety increase self-esteem, coping  ProgressTowards Goals: Progressing-patient is journaling noticing negative thoughts worked on challenging these thoughts getting angry and internal critic  Interventions: DBT, Strength-based, Supportive, and Other: Coping  Summary: Carol Wright is a 36 y.o. female who presents with driving to get groceries. Doing well. Started journal.  Therapist very enthusiastic about this. Patient said start with negative thoughts and go from there. Always kept a journal how feel, something good, not negative. Now if negative thought why have that thought why in that dark place?  Patient shares about childhood so many high expectations on patient have everything under control and not have emotions when have them. Take care of sister had to do and don't  be angry. Have to get done have a tunnel vision and have to get down.  Therapist noted patient internalizing this and how does this now herself.  Talked about getting rid of the voice is knowing source and mad at the source. She tried to please parents and if  complain her fault. Patient says she knows the source don't want the critic engrained in kids expect her oldest to do more than should.  Therapist explored how to stop it from being ingrained would be thought challenge and thought replacement also being angry at the critic the source of the critic. Angry at the critic for providing an unhealthy life-sustaining process not self protective.  Therapist also talked about thoughts shifting perspective. Patient says doesn't notice the small things that are positive a song and sound don't look at those things look at the big picture and don't look at the small things to appreciate. Think autopilot go through motions of the day know responsibilities of the day so don't see those things.  She will work on this as well as Editor, commissioning.  Therapist read passages from not because patient has trauma but she can practice many strategies suggested.  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  Discussed origin of critic comes from experiences of childhood and she needs to distance them as she now has incorporated into her own psyche and this critic is unhealthy.  Needs to work on healthy ego development such as self compassion and self protection.  Noted can get angry at the critic, get angry at the source that created the critic can use thought substitution thought correction thought stopping to change the voice of the critic to something softer more helpful.  Noted with patient who  did not get angry that getting angry could be healing for her to let this out.  Provided positive feedback for patient's journaling this will be helpful tool for her making progress in treatment.  Suicidal/Homicidal: No  Plan: Return again  in 4 weeks.2.  Look at mindfulness from trauma book, look at emotional healing from trauma book, thoughts intentions book look at core beliefs and challenging rules from core beliefs  Diagnosis: Major depressive disorder, moderate, recurrent, generalized anxiety disorder  Collaboration of Care: Other none needed  Patient/Guardian was advised Release of Information must be obtained prior to any record release in order to collaborate their care with an outside provider. Patient/Guardian was advised if they have not already done so to contact the registration department to sign all necessary forms in order for [place] to release information regarding their care.   Consent: Patient/Guardian gives verbal consent for treatment and assignment of benefits for services provided during this visit. Patient/Guardian expressed understanding and agreed to proceed.   Cordella Register, LCSW 02/23/2022

## 2022-03-02 ENCOUNTER — Ambulatory Visit: Payer: Medicaid Other | Admitting: Physician Assistant

## 2022-03-23 ENCOUNTER — Ambulatory Visit (INDEPENDENT_AMBULATORY_CARE_PROVIDER_SITE_OTHER): Payer: Medicaid Other | Admitting: Licensed Clinical Social Worker

## 2022-03-23 DIAGNOSIS — F411 Generalized anxiety disorder: Secondary | ICD-10-CM | POA: Diagnosis not present

## 2022-03-23 DIAGNOSIS — F331 Major depressive disorder, recurrent, moderate: Secondary | ICD-10-CM | POA: Diagnosis not present

## 2022-03-23 NOTE — Progress Notes (Signed)
Virtual Visit via Video Note  I connected with Ermel Verne on 03/23/22 at  9:00 AM EST by a video enabled telemedicine application and verified that I am speaking with the correct person using two identifiers.  Location: Patient: home Provider: office   I discussed the limitations of evaluation and management by telemedicine and the availability of in person appointments. The patient expressed understanding and agreed to proceed.   I discussed the assessment and treatment plan with the patient. The patient was provided an opportunity to ask questions and all were answered. The patient agreed with the plan and demonstrated an understanding of the instructions.   The patient was advised to call back or seek an in-person evaluation if the symptoms worsen or if the condition fails to improve as anticipated.  I provided 50 minutes of non-face-to-face time during this encounter.  THERAPIST PROGRESS NOTE  Session Time: 9:00 AM to 9:50 AM  Participation Level: Active  Behavioral Response: CasualAlertappropriate  Type of Therapy: Individual Therapy  Treatment Goals addressed: decrease depression, decrease anxiety increase self-esteem, coping  ProgressTowards Goals: Progressing-continue to work on key issues to help with symptoms including user-friendly mind, more self compassion looking at emotional layers of recovery  Interventions: CBT, Solution Focused, Strength-based, Supportive, and Other: Emotional intelligence, self-esteem  Summary: Isaly Fasching is a 37 y.o. female who presents with doing well. Holidays were nice father came from [place]. Christmas with sister and New Year's Eve with them.  Patient does identify in childhood lack of nurturance grandmother tried left to do a lot on her own. Grandmother complained to mother and mom would say she is single mom you have to oldest. Complain to grandmother and say something to mom would be hit.  Learn not to say anything.  Reviewing  developmental rest patient notices self compassion is something she really needs to work on self-esteem ability to relax.  Relates experience of always waiting for something to happen.  Noted self expression has been pointing out this is an issue patient recognizes this do not want to deal with confrontation and pushed back.  Cannot see it how relates to childhood not make mother upset not say anything.  Also states needs to work on willpower motivation therapist encouraged her to give herself grace can be hard when there is a lot to do with the day but also noting persistence as a key helpful strategy for coping.  Patient notices she will give excuses.  In terms of critical voice how this comes from childhood patient saying it was never enough.  Husband says compliments minimize what he is saying. He will get upset that by talking down to his complements he in fact is criticizing him that he is an idiot for seeing something and for her not accepting the compliment.    Therapist reviewed treatment plan patient gave consent to complete virtually noted progress with depression learning coping skills that are helpful.  Therapist continues to review literature about how childhood experience impacts current symptoms.  Reviewed developmental rest because of a childhood where patient took on the parental role and could not complain patient identifies needing to work on self esteem self compassion ability to relax.  Noted the way the critical voice developed no nurturance, never enough so internalization of parental rules to gain acceptance and when this was not enough then requiring more more of ourselves to get the approval.  Developing perfectionist standards which still are not enough.  Talked to patient about developing a user-friendly brain.  Noted when husband complements are she is going to take the complement and not say anything.  Talked about mindfulness important aspect of being we are aware of our thoughts  and feelings so we can noticed self-critical thoughts and also practice more self compassion.  Discussed aspect of emotional healing other layer of healing.  However negative emotions get repressed and how this is not healthy we have to experience and expressed full range of emotions to experience the +ones also so we do not get stuck.  Therapist thinks this will be helpful for patient and her self-acceptance to recognize she will have a range of emotions and not be self-critical.  There can be a layer of emotional healing which includes grieving the loss of things lacking in childhood, part of grieving can include verbal ventilation frustration that these things were loss with ultimate goal of patient nurturing herself. Suicidal/Homicidal: No  Plan: Return again in 2 weeks.2.  Patient look at podcast from [employer] dump your inner drill sergeant, continue to look at emotional recovery.3.  Look at exercises for self-esteem, look at thoughts and intentions workbook, look at worksheet 8 steps for building self-esteem, patient will except complements without down sizing them.   Diagnosis: Major depressive disorder, moderate, recurrent, generalized anxiety disorder  Collaboration of Care: Other none needed  Patient/Guardian was advised Release of Information must be obtained prior to any record release in order to collaborate their care with an outside provider. Patient/Guardian was advised if they have not already done so to contact the registration department to sign all necessary forms in order for [place] to release information regarding their care.   Consent: Patient/Guardian gives verbal consent for treatment and assignment of benefits for services provided during this visit. Patient/Guardian expressed understanding and agreed to proceed.   Cordella Register, LCSW 03/23/2022

## 2022-04-06 ENCOUNTER — Ambulatory Visit (HOSPITAL_COMMUNITY): Payer: Medicaid Other | Admitting: Licensed Clinical Social Worker

## 2022-04-07 ENCOUNTER — Other Ambulatory Visit (HOSPITAL_COMMUNITY): Payer: Self-pay | Admitting: Psychiatry

## 2022-04-07 DIAGNOSIS — F411 Generalized anxiety disorder: Secondary | ICD-10-CM

## 2022-04-07 DIAGNOSIS — F53 Postpartum depression: Secondary | ICD-10-CM

## 2022-04-07 NOTE — Telephone Encounter (Signed)
Last Appt   12-3021 Next Appt  04/13/22

## 2022-04-13 ENCOUNTER — Telehealth (INDEPENDENT_AMBULATORY_CARE_PROVIDER_SITE_OTHER): Payer: Medicaid Other | Admitting: Psychiatry

## 2022-04-13 ENCOUNTER — Encounter (HOSPITAL_COMMUNITY): Payer: Self-pay | Admitting: Psychiatry

## 2022-04-13 DIAGNOSIS — F53 Postpartum depression: Secondary | ICD-10-CM | POA: Diagnosis not present

## 2022-04-13 DIAGNOSIS — F5102 Adjustment insomnia: Secondary | ICD-10-CM | POA: Diagnosis not present

## 2022-04-13 DIAGNOSIS — F331 Major depressive disorder, recurrent, moderate: Secondary | ICD-10-CM | POA: Diagnosis not present

## 2022-04-13 DIAGNOSIS — F411 Generalized anxiety disorder: Secondary | ICD-10-CM

## 2022-04-13 NOTE — Progress Notes (Signed)
[place] Follow up visit  Patient Identification: Carol Wright MRN:  [id number] Date of Evaluation:  04/13/2022 Referral Source: [facility] Counsellor Chief Complaint:   No chief complaint on file. Follow up depression, post partum Visit Diagnosis:    ICD-10-CM   1. Major depressive disorder, recurrent episode, moderate (HCC)  F33.1     2. Generalized anxiety disorder  F41.1     3. 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 Adjustment insomnia  F51.02       Virtual Visit via Video Note  I connected with [name] on 04/13/22 at 10:15 AM by a video enabled telemedicine application and verified that I am speaking with the correct person using two identifiers.  Location: Patient: parked car Provider: home office   I discussed the limitations of evaluation and management by telemedicine and the availability of in person appointments. The patient expressed understanding and agreed to proceed.     I discussed the assessment and treatment plan with the patient. The patient was provided an opportunity to ask questions and all were answered. The patient agreed with the plan and demonstrated an understanding of the instructions.   The patient was advised to call back or seek an in-person evaluation if the symptoms worsen or if the condition fails to improve as anticipated.  I provided 15 minutes of non-face-to-face time during this encounter.  History of Present Illness: Patient is a 38 years old currently married female she has 3 kids youngest is 47 years of age she is a homemaker.  Referred initially by primary care physician on-call start to establish for her depression and postpartum possible depression  Doing fair , takes care of  kids, some stress fiancial, husband is working  [facility] has helped in therapy to deal with stress Sleep fair works on sleep hyginee and seldom takes trazadone   Also in therapy, bonding and stressors are manageable  Aggravating factors: taking care of grand  parents, mom doesn't take much responsibility, grand pa has dementia  Modifying factor: marriage, kids Duration adult life [facility] admission denies Drug use denies   Past Psychiatric History: depression  Previous Psychotropic Medications: Yes   Substance Abuse History in the last 12 months:  No.  Consequences of Substance Abuse: NA  Past Medical History:  Past Medical History:  Diagnosis Date   Depression    Generalized anxiety disorder    H/O arterial dissection    Hepatic steatosis    Via ultrasound.     History of pre-eclampsia    Hyperlipidemia    Myocardial infarction ([place]) 2013   Obesity (BMI 30-39.9)    PCOS (polycystic ovarian syndrome)    Pre-eclampsia    Spontaneous dissection of coronary artery    a. occurred in 2013 during pregnany. Underwent placement of BMS per the patient's report.     Past Surgical History:  Procedure Laterality Date   Arterial disection  03/17/2011   Bare metal stent  03/17/2011   CESAREAN SECTION  03/17/2011   CESAREAN SECTION N/A 10/07/2020   Procedure: CESAREAN SECTION;  Surgeon: Clarnce Flock, MD;  Location: [facility];  Service: Obstetrics;  Laterality: N/A;   cholcyst     CHOLECYSTECTOMY      Family Psychiatric History: sister: depression  Family History:  Family History  Problem Relation Age of Onset   Hypertension Mother    Hypertension Maternal Grandmother    Heart attack Maternal Grandfather     Social History:   Social History  Socioeconomic History   Marital status: Married    Spouse name: Not on file   Number of children: 1   Years of education: Not on file   Highest education level: Not on file  Occupational History    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  Other  Topics Concern   Not on file  Social History Narrative   Not on file   Social Determinants of Health   Financial Resource Strain: Not on file  Food Insecurity: No Food Insecurity (09/05/2020)   Hunger Vital Sign    Worried About Running Out of Food in the Last Year: Never true    Ran Out of Food in the Last Year: Never true  Transportation Needs: No Transportation Needs (09/05/2020)   PRAPARE - Hydrologist (Medical): No    Lack of Transportation (Non-Medical): No  Physical Activity: Not on file  Stress: Not on file  Social Connections: Not on file     Allergies:   Allergies  Allergen Reactions   Ace Inhibitors     cough    Metabolic Disorder Labs: Lab Results  Component Value Date   HGBA1C 4.5 03/25/2020   MPG 82 03/25/2020   MPG 82 11/27/2019   No results found for: "PROLACTIN" Lab Results  Component Value Date   CHOL 154 12/01/2021   TRIG 89 12/01/2021   HDL 41 (L) 12/01/2021   CHOLHDL 3.8 12/01/2021   VLDL 32 06/20/2013   LDLCALC 95 12/01/2021   LDLCALC 128 (H) 11/11/2020   Lab Results  Component Value Date   TSH 2.14 12/01/2021    Therapeutic Level Labs: No results found for: "LITHIUM" No results found for: "CBMZ" No results found for: "VALPROATE"  Current Medications: Current Outpatient Medications  Medication Sig Dispense Refill   amLODipine (NORVASC) 5 MG tablet Take 5 mg by mouth daily.     aspirin 81 MG chewable tablet Chew by mouth daily.     buPROPion (WELLBUTRIN XL) 300 MG 24 hr tablet Take 1 tablet (300 mg total) by mouth daily. 90 tablet 1   cetirizine (ZYRTEC) 10 MG tablet Take 10 mg by mouth daily.     chlorthalidone (HYGROTON) 25 MG tablet Take 25 mg by mouth daily.     Insulin Pen Needle 31G X 6 MM MISC To use with saxenda pen. 60 each 0   levonorgestrel (LILETTA, 52 MG,) 20.1 MCG/DAY IUD 1 each by Intrauterine route once.     Liraglutide -Weight Management (SAXENDA) 18 MG/3ML SOPN Inject 3 mg into the  skin daily. 0.6 mg inj subcut daily for 1 week, then incr by 0.6 mg weekly until reaching 3 mg injected subcut daily 15 mL 0   metFORMIN (GLUCOPHAGE) 500 MG tablet Take 1 tablet (500 mg total) by mouth 2 (two) times daily with a meal. 180 tablet 1   sertraline (ZOLOFT) 100 MG tablet Take 1 tablet by mouth once daily 90 tablet 0   traZODone (DESYREL) 50 MG tablet Take 0.5-1 tablets (25-50 mg total) by mouth at bedtime as needed for sleep. 30 tablet 2   WEGOVY 0.25 MG/0.5ML SOAJ Inject 0.25 mg into the skin once a week. Use this dose for 1 month (4  shots) and then increase to next higher dose. (Patient not taking: Reported on 12/01/2021) 2 mL 0   WEGOVY 0.5 MG/0.5ML SOAJ Inject 0.5 mg into the skin once a week. Use this dose for 1 month (4 shots) and then increase to next higher dose. (Patient not taking: Reported on 12/01/2021) 2 mL 1   No current facility-administered medications for this visit.     Psychiatric Specialty Exam: Review of Systems  Cardiovascular:  Negative for chest pain.  Neurological:  Negative for tremors.  Psychiatric/Behavioral:  Negative for agitation.     unknown if currently breastfeeding.There is no height or weight on file to calculate BMI.  General Appearance: Casual  Eye Contact:  Fair  Speech:  Normal Rate  Volume:  Decreased  Mood:  fair  Affect:  Constricted  Thought Process:  Goal Directed  Orientation:  Full (Time, Place, and Person)  Thought Content:  Rumination  Suicidal Thoughts:  No  Homicidal Thoughts:  No  Memory:  Immediate;   Fair  Judgement:  Fair  Insight:  Fair  Psychomotor Activity:  Decreased  Concentration:  Concentration: Fair  Recall:  [employer] [place]: Fair  Akathisia:  No  Handed:    AIMS (if indicated):  not done  Assets:  Financial Resources/Insurance Housing Physical Health  ADL's:  Intact  Cognition: WNL  Sleep:   irregular at times   Screenings: [place] Office Visit from  12/01/2021 in [place] at [place] Visit from 05/19/2021 in [place] at [place] Visit from 11/11/2020 in [place] at [place] from 09/30/2020 in [facility] [place] [facility] Clinical Support from 09/12/2020 in [facility] [place] [facility]  Total GAD-7 Score '6 12 13 '$ 0 0      PHQ2-9    [place] Office Visit from 12/01/2021 in [place] at [facility] Video Visit from 10/13/2021 in [place] at [place] from 07/09/2021 in [facility] at [place] Visit from 05/19/2021 in [place] at [place] Visit from 11/11/2020 in [place] at [facility]  PHQ-2 Total Score '3 2 3 2 3  '$ PHQ-9 Total Score '13 10 18 9 16      '$ Flowsheet Row Video Visit from 01/12/2022 in [place] at [facility] Video Visit from 10/13/2021 in [place] at [place] from 07/09/2021 in [place] at [place] No Risk No Risk Low Risk       Assessment and Plan: as follows  Prior documentation reviewed  MDD recurrent moderate:fair continue zoloft, wellbutrin GAD: fluctuates, but fair or manageable continue zoloft And therapy  Post partum depression: doing fair with zoloft and wellbutrin Insomnia: says working on sleep hygiene as trazadone didn't help much, continue sleep hygiene Refills if due were sent  Fu 88m    Collaboration of Care: Other primary care and counsellor notes and meds  reviewed  Patient/Guardian was advised Release of Information must be obtained prior to any record release in order to collaborate their care with an outside provider. Patient/Guardian was advised if they have not  already done so to contact the registration department to sign all necessary forms in order for [place] to release information regarding their care.   Consent: Patient/Guardian gives verbal consent for treatment and assignment of benefits for services provided during this visit. Patient/Guardian expressed understanding and agreed to proceed.   Merian Capron, MD 1/29/202410:20 AM

## 2022-04-27 ENCOUNTER — Ambulatory Visit (INDEPENDENT_AMBULATORY_CARE_PROVIDER_SITE_OTHER): Payer: Medicaid Other | Admitting: Licensed Clinical Social Worker

## 2022-04-27 DIAGNOSIS — F411 Generalized anxiety disorder: Secondary | ICD-10-CM

## 2022-04-27 DIAGNOSIS — F331 Major depressive disorder, recurrent, moderate: Secondary | ICD-10-CM

## 2022-04-27 NOTE — Progress Notes (Signed)
Virtual Visit via Video Note  I connected with Carol Wright on 04/27/22 at  9:00 AM EST by a video enabled telemedicine application and verified that I am speaking with the correct person using two identifiers.  Location: Patient: home Provider: office   I discussed the limitations of evaluation and management by telemedicine and the availability of in person appointments. The patient expressed understanding and agreed to proceed.  I discussed the assessment and treatment plan with the patient. The patient was provided an opportunity to ask questions and all were answered. The patient agreed with the plan and demonstrated an understanding of the instructions.   The patient was advised to call back or seek an in-person evaluation if the symptoms worsen or if the condition fails to improve as anticipated.  I provided 45 minutes of non-face-to-face time during this encounter.  THERAPIST PROGRESS NOTE  Session Time: 9:00 AM to 9:45 AM  Participation Level: Active  Behavioral Response: CasualAlertappropriate  Type of Therapy: Individual Therapy  Treatment Goals addressed: decrease depression, decrease anxiety increase self-esteem, coping  ProgressTowards Goals: Progressing-worked on self-esteem noted strategies patient is engaging in to help with symptoms such as listening to podcast, 3 things she feels good about during the day  Interventions: CBT, Solution Focused, Strength-based, Supportive, Reframing, and Other: Self-esteem, coping  Summary: Carol Wright is a 37 y.o. female who presents with been good. Liked listening to the [employer] podcast listens to it when doing housework.  Therapist noted through these podcast will build additional skills helpful for working on treatment goals.  Patient tries to list three things grateful with or feel that did well.  Sold house plan to move in with grandparents for the time being mom going up to [employer] conflict with Dad has  dementia doesn't help with his daily processing and he feeds off of negativity. Save money better care then what receiving now. Spend so much time there anywhere fix things, gardening. Mom is there to take grocery store patient take on other responsibilities such as going with them to doctor's know their medical history so helps care for them. Patient thinks helpful difficult to maintain two households. Easier to be there full time more family time.  Sister and patient had argument bailing out mom of responsibility and how many times going to do that. Didn't do responsibilities as kids and now mom not take care of grandparents. Mom already talking about patient moving in taking their money. Hurts it is her mom patient not waiting for them to pass hope to have some more years with them. Hope with the kids there more happiness and joy. Mom has been like this most of her life. Sister is more thick skinned and says let her do her. Therapist agrees. She is counting on using the money to pay her expenses. To patient says more valuable for grandparents to be alive can go to them for advice, enjoy time with kids. Mom is selfish only child.  Frees up time don't have to do their own house projects. Now hope to have more free time at grandparents or doing things there. There also have been arguments with her and husband of his dragging his feet but not time to do anything.  Feels really good when does something grandparents really happy about it feels good.  Patient responded to information in the article about forgiveness.  Now sure when she will get over things to forgive. Can't forgive yet. Husband and family estranged he recently reached  out which is nice. Still can't have a relationship with them yet a lot of things said early on in marriage can't get over. Has a gripe with them for different reasons.  Therapist noted forgiveness as a personal journey also can be difficult to do when you have been hurt validating how  patient is feeling.  Process with pain in both mom going back to [place] and his sister pointed out patient back in the role of taking care of things that mom does not.  Therapist provided perspective that it seems mom is more of a hindrance not helpful patient still having to do the work to take care of grandparents there may be some advantages of her going back to [place].  Patient sees this in terms of moving in with grandparents may cut down on responsibilities freeing up more time for family therapist adding developing strong bond with grandparents which patient also sees as a positive.  Talked about mom spreading information patient just interested in money and appears to have to do more with mom and her focus rather than patient.  Reinforced sister concept let her do her set boundaries realize the source that may help not being so hurt by this.  Worked in session on worksheet on self-esteem reinforcing some highlights including we will have unconditional worth. Only 1 true opinion matters are own and we even have to be careful about that because week me are worse critic.  Noted ways we have been working on this in session that we all have a narrative or story we created about ourselves that shapes or self perceptions upon which her core self images based.  If we want to change that story we have to understand where it came from and where we receive the messages we tell her self whose voices are we internalizing.  Noted ways we are distancing patient from negative narrative [employer] when younger.  Noted positive of patient noting 3 positive things she does in the day noted changing negative self talk and positive affirmations can lessen symptoms of depression as well as helping with self-esteem.  Noted in patient's life doing things for others helps them as well is help give her self sense of value something more proud of patient notes this as well with helping grandparents.  Assessed positive patient  listening to [facility] and for her to share some of the podcast for next session noting positive of patient writing down 3 things she did she feels good about in the day. Suicidal/Homicidal: No  Plan: Return again in 2 weeks.2.  Look at self compassion and self-esteem book, challenging core beliefs from thoughts and emotions book  Diagnosis: Major depressive disorder, moderate, recurrent, generalized anxiety disorder  Collaboration of Care: Medication Management AEB review of Dr. De Nurse last note  Patient/Guardian was advised Release of Information must be obtained prior to any record release in order to collaborate their care with an outside provider. Patient/Guardian was advised if they have not already done so to contact the registration department to sign all necessary forms in order for [place] to release information regarding their care.   Consent: Patient/Guardian gives verbal consent for treatment and assignment of benefits for services provided during this visit. Patient/Guardian expressed understanding and agreed to proceed.   Cordella Register, LCSW 04/27/2022

## 2022-05-11 ENCOUNTER — Ambulatory Visit (INDEPENDENT_AMBULATORY_CARE_PROVIDER_SITE_OTHER): Payer: Medicaid Other | Admitting: Licensed Clinical Social Worker

## 2022-05-11 DIAGNOSIS — F411 Generalized anxiety disorder: Secondary | ICD-10-CM | POA: Diagnosis not present

## 2022-05-11 DIAGNOSIS — F331 Major depressive disorder, recurrent, moderate: Secondary | ICD-10-CM

## 2022-05-11 NOTE — Progress Notes (Signed)
Virtual Visit via Video Note  I connected with Celina Atherley on 05/11/22 at  9:00 AM EST by a video enabled telemedicine application and verified that I am speaking with the correct person using two identifiers.  Location: Patient: Stationary car Provider: office   I discussed the limitations of evaluation and management by telemedicine and the availability of in person appointments. The patient expressed understanding and agreed to proceed.  I discussed the assessment and treatment plan with the patient. The patient was provided an opportunity to ask questions and all were answered. The patient agreed with the plan and demonstrated an understanding of the instructions.   The patient was advised to call back or seek an in-person evaluation if the symptoms worsen or if the condition fails to improve as anticipated.  I provided 45 minutes of non-face-to-face time during this encounter.   THERAPIST PROGRESS NOTE  Session Time: 9:00 AM to 9:45 AM  Participation Level: Active  Behavioral Response: CasualAlertappropriate  Type of Therapy: Individual Therapy  Treatment Goals addressed: decrease depression, decrease anxiety increase self-esteem, coping  ProgressTowards Goals: Progressing-patient using session to process thoughts and feelings noting therapist providing validating supportive interventions  Interventions: Solution Focused, Strength-based, Supportive, and Other: Coping  Summary: Rayya Sliman is a 37 y.o. female who presents with March 14 is the closing. Moving themselves. Going to be storing in storage. Listening to Podcast recommended by therapist about eating healthy being more mindful what eating.  Therapist surprised and also please that she is finding things that are relevant to her that therapist may not have realized.  Patient explains more about podcast that we eat for the pleasure of it when hunter and gatherers to sustain life not eat just to eat and what she does  eats when not hungry. When all this passes can get back to eating her way. Right now quick food and take out. Before pregnant with Richard anabolic diet and plan to get back on it. Made food prep for the week know what eating. Go a lot on youtube found a fitness person content saying made sense followed recipes got down to 183 lbs. Rosezena Sensor. Enjoy cooking. Cook for them Sunday and for her was meal prep day. Would eat some anabolic food and sometimes cook that for the family sometimes interchange one of the food items for something healthy. A lot of  chicken. Things enjoyed french toast. Had popcorn snack version.  Get along with sister really well she called last week and talked abut move she is completely against patient selling her house and says looking out for patient's mental health that there is a lot for her to handle. Patient said her sister thought dumbest idea ever, was negative whereas patient will support sister. Not getting the support she expected. Message is that Mom knows going to have to go back and help patient. Decide to put house on market first one to go to was Mom ask if happier in [place] and she said yes. Asked grandmother if ok move in with them and she was over the moon. Then in [place] the message while grandparents visiting that nobody knew what was happening invading their space. They picture her husband as money hungry but he bends over backwards to help grandparents. Bothers her that treats [place] like the worst person.  Hard that they come back to her with a different story from the one that was there. Husband talked to his Mom moving back to [place] she said  to move in with her. They can do that at least a place to go.  Clarity with grandmother knew that patient moving in. Therapist noted there are problems distorting the truth of things. Mother and sister talk negatively about husband why don't know. Maybe look down he is blue collar worker less than what patient had.  Thinks he has nice things because of her and family doesn't see that he works hard. Tries to help even them. It  hurts. Don't see truth and twist to the negative.  Grandmother says when sister and mom together toxic and advised patient to keep distance from sister. Heard things that aren't nice. They have their ideas of things which aren't necessarily true.  Hurts because mom and sister and patient close. New thing grandmother plays favorite. Got a house once came down paid rent transfer in your name [place]. Now have more kids need a bigger house. They always thought of getting enough land build two houses both her family and grandparents can have a house. Money saving so grandparents be comfortable. Her family's version is that getting money out of the house stiffing Skylar not fair that they got a house and sister didn't get a house.  Patient says Mom has treated her and sister differently. Allowed sister to go to school functions hang out with friends. Felt like favoritism and now claims there is favoritism. Sister and grandmother not best relationship patient is respectful and sister no filter. Understand why she does favor patient doesn't talk back to her doesn't tell her to shut up Mom will say to grandparents told me that bunch of times.  With sister doesn't call and text her as much a little distance stresses patient that what says can be flipped to be something else go to mom always had been open with sister. Therapist says doesn't have a clear picture of it. Therapist says the truth will evolve and speak the loudest what is going on. Reviewed session and patient said never thought listen to Podcast and find that she benefits from it.  Noted recommendation of listening to podcasts and a positive thing patient has been able to fine ones she finds helpful therapist assesses the benefit of this where patient can find herself the things most helpful that therapist might not grasp.  Patient found  mindfulness eating helpful and therapist surprised at that would have thought that.  As it turns out she would like to lose baby weight helpful and in her plan along with following a diet that is really work for her.  Talked about moving and family relationships about sister mom have been negative therapist provided her perspective that they are distorting treat the things in a negative way.  Validated patient that she is hurt by this therapist pointed out she thinks of it is a good plan terms of practicality to help grandmother will be beneficial for her grandmother and grandfather as well and this seems to be more accurate pitcher.  Also patient points out she is kinder to her grandmother and therapist noted this plays out in relationship people are more giving to people are willing to invest more in relationship.  Therapist noted truth will come out of situation and not inaccuracies.  The truth may end up the patient and her family have come up with a plan that does not work and they can change the plan.  Noted patient's intention of trying to make grandparents comfortable that her family is twisting this and again  truth of her intentions will come out naturally.  Therapist provided support and space for patient to talk about thoughts and feelings in session Suicidal/Homicidal: No  Plan: Return again in 2 weeks.2.  Compassion from self-esteem book, look at core beliefs from thoughts intentions, patient continue to listen to podcast and share with therapist  Diagnosis: Major depressive disorder, moderate, recurrent, generalized anxiety disorder   Collaboration of Care: Other none needed  Patient/Guardian was advised Release of Information must be obtained prior to any record release in order to collaborate their care with an outside provider. Patient/Guardian was advised if they have not already done so to contact the registration department to sign all necessary forms in order for [place] to release  information regarding their care.   Consent: Patient/Guardian gives verbal consent for treatment and assignment of benefits for services provided during this visit. Patient/Guardian expressed understanding and agreed to proceed.   Cordella Register, [place] 05/11/2022

## 2022-05-25 ENCOUNTER — Ambulatory Visit (INDEPENDENT_AMBULATORY_CARE_PROVIDER_SITE_OTHER): Payer: Medicaid Other | Admitting: Licensed Clinical Social Worker

## 2022-05-25 DIAGNOSIS — F331 Major depressive disorder, recurrent, moderate: Secondary | ICD-10-CM

## 2022-05-25 DIAGNOSIS — F411 Generalized anxiety disorder: Secondary | ICD-10-CM | POA: Diagnosis not present

## 2022-05-25 NOTE — Progress Notes (Signed)
Virtual Visit via Video Note  I connected with Nyelli Skellenger on 05/25/22 at  9:00 AM EDT by a video enabled telemedicine application and verified that I am speaking with the correct person using two identifiers.  Location: Patient: Stationary car Provider: office   I discussed the limitations of evaluation and management by telemedicine and the availability of in person appointments. The patient expressed understanding and agreed to proceed.   I discussed the assessment and treatment plan with the patient. The patient was provided an opportunity to ask questions and all were answered. The patient agreed with the plan and demonstrated an understanding of the instructions.   The patient was advised to call back or seek an in-person evaluation if the symptoms worsen or if the condition fails to improve as anticipated.  I provided 50 minutes of non-face-to-face time during this encounter.  THERAPIST PROGRESS NOTE  Session Time: 9:00 AM to 9:50 AM  Participation Level: Active  Behavioral Response: CasualAlertDysphoric  Type of Therapy: Individual Therapy  Treatment Goals addressed: decrease depression, decrease anxiety increase self-esteem, coping  ProgressTowards Goals: Progressing-looked at patterns of unhelpful thinking to help with depression and anxiety self-esteem  Interventions: CBT, Solution Focused, Strength-based, Supportive, and Reframing  Summary: Amryn Vespa is a 37 y.o. female who presents with last night first night got to grandparents. Mom is there 4/6 until she leaves. She will officially move back first week of April. Asked about her day and it is organizing things. Still getting the negative from Mom and sister. She is very snarky. Therapist brought up possibilities of jealousy and could be. Patient has goal of moving [place]. Close to their goal then she is. The negative wears on her second guesses they see something that patient could be missing. Messes  her up other aspects have doubts and then start arguing with husband who says decision already made. Sister brings up things like Grandfather has dementia and things don't want children to see. What would you do if hit them ? She is looking at negative and therapist noted some positive and patient does as well. He is a lot better with the kids and pleasant plays with them. Therapist noted culturally relationship with older generation are valued. Patient want kids to make as many memories of them as can. Being with grandparents gives them more energy or life is a plus. With husband he is biter to her family. He and sister not have a great relationship it is getting worse. He says something to her and she gets back to patient in a negative way. She starts conversations with patient with I am concerned with mental health instead of getting the message needed mom's help. Sister is passive aggressive. Close with sister but more of wall and careful what say with her negative. From what understand close to mom and they talk negative about her and husband. Patient feels a little stressed almost feel like failed but knows at same time haven't. In her  mind got rid of house and living with grandparents. Failing with life dampens her mood bothers her husband he doesn't agree with point her point of view so arguing. Moving is stressful. Does have tunnel vision and only sees certain things.        Therapist provided space and support for patient to talk about thoughts and feelings in session.  Patient noted how negativity of sister has impacted her her perception of things.  Therapist provided positive feedback that she is being more guarded  providing her own feedback of seeing some positives of the mood.  Looked at some of the patterns of patient's thinking that may factor into distortions in thinking helping her to challenge thoughts.  Things such as filtering, catastrophizing, noted with depression tending to filter and on  the negative.  Therapist noted guided patient in finding a more helpful thought 1 that was more balanced.  Asked patient to continue to look at these patterns to help untangle herself from negative thoughts.  Therapist provided active listening open questions supportive interventions. Suicidal/Homicidal: no  Plan: Return again in 2 weeks.2.  Continue to look at thoughts and emotions workbook chapter 3  Diagnosis:  Major depressive disorder, moderate, recurrent, generalized anxiety disorder  Collaboration of Care: Other none needed  Patient/Guardian was advised Release of Information must be obtained prior to any record release in order to collaborate their care with an outside provider. Patient/Guardian was advised if they have not already done so to contact the registration department to sign all necessary forms in order for [place] to release information regarding their care.   Consent: Patient/Guardian gives verbal consent for treatment and assignment of benefits for services provided during this visit. Patient/Guardian expressed understanding and agreed to proceed.   Cordella Register, LCSW 05/25/2022

## 2022-05-26 ENCOUNTER — Other Ambulatory Visit: Payer: Self-pay | Admitting: Physician Assistant

## 2022-05-26 ENCOUNTER — Other Ambulatory Visit: Payer: Self-pay

## 2022-05-26 DIAGNOSIS — F331 Major depressive disorder, recurrent, moderate: Secondary | ICD-10-CM

## 2022-05-26 DIAGNOSIS — F411 Generalized anxiety disorder: Secondary | ICD-10-CM

## 2022-06-08 ENCOUNTER — Ambulatory Visit (INDEPENDENT_AMBULATORY_CARE_PROVIDER_SITE_OTHER): Payer: Medicaid Other | Admitting: Licensed Clinical Social Worker

## 2022-06-08 DIAGNOSIS — F411 Generalized anxiety disorder: Secondary | ICD-10-CM

## 2022-06-08 DIAGNOSIS — F331 Major depressive disorder, recurrent, moderate: Secondary | ICD-10-CM | POA: Diagnosis not present

## 2022-06-08 NOTE — Progress Notes (Signed)
Virtual Visit via Video Note  I connected with Carol Wright on 06/08/22 at  9:00 AM EDT by a video enabled telemedicine application and verified that I am speaking with the correct person using two identifiers.  Location: Patient: Stationary car Provider: office   I discussed the limitations of evaluation and management by telemedicine and the availability of in person appointments. The patient expressed understanding and agreed to proceed.   I discussed the assessment and treatment plan with the patient. The patient was provided an opportunity to ask questions and all were answered. The patient agreed with the plan and demonstrated an understanding of the instructions.   The patient was advised to call back or seek an in-person evaluation if the symptoms worsen or if the condition fails to improve as anticipated.  I provided 52 minutes of non-face-to-face time during this encounter.  THERAPIST PROGRESS NOTE  Session Time: 9:00 AM to 9:52 AM  Participation Level: Active  Behavioral Response: CasualAlertappropriate  Type of Therapy: Individual Therapy  Treatment Goals addressed: decrease depression, decrease anxiety increase self-esteem, coping  ProgressTowards Goals: Progressing-worked on cognitive strategies for anxiety depression self-esteem as well as processing thoughts and feelings in session  Interventions: CBT, Solution Focused, Strength-based, Supportive, Reframing, and Other: coping  Summary: Carol Wright is a 37 y.o. female who presents with moved in  to grandparents. It has been interesting new dynamic. Husband and kids have different schedule than grandparents and mom. Different dinner schedule. Have to set some boundaries not jibing with their schedule it has been difficult. On Sunday plan was to make Peter's father meal and tell a couple of stories and Skyler's communion that was the plan. This how the acknowledge him on his birthday as he has passed. Had to get  stuff done. Doing stuff figured dinner about 6:00/6:30 PM. Grandparents and Mom were complaining they were starving. Mom said everyone do their own. During the week what do. They are feeding the kids, however, earlier and then her and husband end up having dinner together. Patient thinks it will take a lot of time to get adjusted.  Therapist asked patient to think of some of the positive things that they are there. Good thing when here don't rush to get home to get dogs out and to feed the kids. Grandparents get help on the spot. Grandma loves her garden have been working on it and she sit in the chair and watches them. Therapist hopes will make patient's  schedule a little easier. Hesitant to leave kids with grandfather but said to herself Sharia Reeve there will tell her. He was playing with Baldo Ash and Delfino Lovett it was good to see him interacting with the kids playing. Combined communion and celebrating grandfather who passed birthday. Has her up and downs since there since a huge change doubting their decision think mess up think should have stayed home where they are. Mom said can't wait go to [place] and live my life. Angry patient dealing with her responsibilities since 4.  Bothers her she gets out and able to live life. What about her life take on responsibility all her life. Had to care sister from 3 PM on. Mom leave stuff out or grandmother come and bring pots. Friday go out to dinner. Therapist noted at 4 too little even at 24 patient says how was expected to care for a human at 9. Sister and her close she was her caretakers became close.  Angry then second guesses her decision should have stayed where  they are giving her the opportunity to leave her responsibility. Therapist noted even when she was there did not do a lot. Mom was passing the problem onto her.  She was an additional problem. Mom doesn't get along with Skylar nic pics on her. Skylar has trouble regulating her temper told her not to listen  and when pick on her ignore rather than engage in argument. She gets in trouble three times with Mom patient and Dad. Mom doesn't know how to deal with her. Told Mom word things just right then do what told. Husband gets angry at patient  when doubtful he only sees positive there to help grandparents. Now that they are there not as hard core arguments between mom and grandparents. Doing the gardens it is nice. Therapist said to look at more helpful thought. Therapist thinks this arrangement makes sense and patient agrees.  Notices catastrophize a lot and overgeneralizing tend to do but not as much.          Therapist per bided space and support for patient to process thoughts and feelings related to current stressors.  Mom continues lifelong pattern of putting responsibility on patient and therapist validated patient on anger she feels.  Therapist countered though patient's thinking relating mom has not really added much anyway and therapist added her perspective that she thinks moving into [place] parents will make things easier and as she notes have some positive experiences which she has already had.  Therapist asked patient to note some positives to counter negative thinking.  Therapist noted just that she counseled her daughter about her mom she to might have to let go and have acceptance to distance herself from mom's negative patterns.  Continue to work on limited thinking patterns working on counters to different patterns.  Noted for patient a big 1 is catastrophizing and perhaps overgeneralizing.  Noted for catastrophizing looking at what are the odds? For overgeneralizing quantify instead of using words that are extremes specifically state the issue, exam and how much evident she really have for your conclusion this 1 a very helpful 1 for limited thinking.  Stop thinking and absolutes therapist noting her language is important affects how we feel pay close attention to the word you used to describe  yourself and others replaced frequently use negative labeled with more neutral terms.  Therapist expanded on journal adding more columns for identifying limited thinking it also working on countering the negative thought.  Therapist encouraged patient to begin working not only on identifying the limited pattern but also countering.  Therapist provided active listening open questions supportive interventions. Suicidal/Homicidal: No  Plan: Return again in 2 weeks.2.  Continue with thoughts and feelings workbook  Diagnosis: Major depressive disorder, moderate, recurrent, generalized anxiety disorder  Collaboration of Care: Other none needed  Patient/Guardian was advised Release of Information must be obtained prior to any record release in order to collaborate their care with an outside provider. Patient/Guardian was advised if they have not already done so to contact the registration department to sign all necessary forms in order for [place] to release information regarding their care.   Consent: Patient/Guardian gives verbal consent for treatment and assignment of benefits for services provided during this visit. Patient/Guardian expressed understanding and agreed to proceed.   Cordella Register, LCSW 06/08/2022

## 2022-06-22 ENCOUNTER — Ambulatory Visit (INDEPENDENT_AMBULATORY_CARE_PROVIDER_SITE_OTHER): Payer: Medicaid Other | Admitting: Licensed Clinical Social Worker

## 2022-06-22 DIAGNOSIS — F331 Major depressive disorder, recurrent, moderate: Secondary | ICD-10-CM | POA: Diagnosis not present

## 2022-06-22 DIAGNOSIS — F411 Generalized anxiety disorder: Secondary | ICD-10-CM | POA: Diagnosis not present

## 2022-06-22 NOTE — Progress Notes (Signed)
Virtual Visit via Video Note  I connected with Carol Wright on 06/22/22 at  9:00 AM EDT by a video enabled telemedicine application and verified that I am speaking with the correct person using two identifiers.  Location: Patient: home Provider: home office   I discussed the limitations of evaluation and management by telemedicine and the availability of in person appointments. The patient expressed understanding and agreed to proceed.   I discussed the assessment and treatment plan with the patient. The patient was provided an opportunity to ask questions and all were answered. The patient agreed with the plan and demonstrated an understanding of the instructions.   The patient was advised to call back or seek an in-person evaluation if the symptoms worsen or if the condition fails to improve as anticipated.  I provided 53 minutes of non-face-to-face time during this encounter.  THERAPIST PROGRESS NOTE  Session Time: 9:00 AM to 9:53 AM  Participation Level: Active  Behavioral Response: CasualAlertAnxious  Type of Therapy: Individual Therapy  Treatment Goals addressed: decrease depression, decrease anxiety increase self-esteem, coping  ProgressTowards Goals: Progressing-working on coping skills for ruminating thoughts  Interventions: CBT, Motivational Interviewing, Strength-based, Supportive, and Other: coping  Summary: Chastidy Swartzlander is a 37 y.o. female who presents with transition challenging thought would easier but because there often different dynamic  now living there make it challenging. Therapist explored positives with patient. A lot easier there for the children to roam in the backyard and play and not worry about them. Doing meals still an issue. Grandparents go to eat and feed the kids by the time they eat dinner full or don't want her food want grandmother's food. Difficult trying to adapt. Emelia Loron is doing excellent. It is nice to see him active and engaging with  kids or complaining about things. Mom rubs in excited to leave and leaving behind a huge a responsibility on patient it is a lot. It is all about her-Mom. Once in awhile says feel terrible leaving this burden then goes on to talk about plans. She could think about it in terms of how patient feels about it but she doesn't do that. Mom says she is stressed but then making plans. Figuring out what do with house responsibility thinking about when go to beach should be thinking about what reality company she needs to contact Patient says she notices a couple of things come back ruminating thoughts come back. Try to make it make sense in head is it important can't stop thinking about it over and over. A word comes in her mind and stay doesn't know if stressed.   Talked about environment that impacts anxiety include past experiences patient said makes sense  ruminating thought from high school related to a racist incident go back and can't stop. Don't why still comes up and if try to change goes to another ruminating thought. Makes sense patterns started in childhood. Thinks about could have done and should have done but also realized couldn't have done. Therapist noted hindsight basis.   Therapist work with patient over thinking looking at book "stop overthinking" noting that overthinking is what usually is the issue is the quality of the thought not the quantity because people can think a lot and not be so disturbed.  Noted our work on CBT helpful in getting out of negative thinking patterns.  Noted identifying the problem as the overthinking again to help patient separate herself from it.  Provided background overthinking comes from a combination of genetics and  environment. Noted both major depression and generalized anxiety disorder were strongly linked to traumatic like events in the previous month such as bereavement, racism.  Patient noting this in her own life experience.  Our immediate environments can also  be a factor therapist pointing out increase in stress related to the transitioning to living with grandparents so it is not just a genetic component that is responsible life events and environmental stressors can make [place] more vulnerable to experiencing anxiety.  So nature versus nurture it is both together 1 final and powerful determined are of whether we experience anxiety is our unique cognitive style or mental frames and the behavior that these inspire and ice.  At the interface between nature nurture is the story we tell ourselves the appraisal can significantly impact ruminating thoughts.  As we have been working on with CBT strategies will continue to do so also look at the appraisal, look at how trauma has impacted ruminating thoughts, look at anxiety in general as determining overthinking and ways to manage anxiety.  Therapist provided space and support for patient to talk about thoughts and feelings in session Suicidal/Homicidal: No  Plan: Return again in 2 weeks.2.Continue with thoughts and feelings workbook, stop overthinking book, GAD from [employer]  Diagnosis: Major depressive disorder, moderate, recurrent, generalized anxiety disorder  Collaboration of Care: Other none needed  Patient/Guardian was advised Release of Information must be obtained prior to any record release in order to collaborate their care with an outside provider. Patient/Guardian was advised if they have not already done so to contact the registration department to sign all necessary forms in order for [place] to release information regarding their care.   Consent: Patient/Guardian gives verbal consent for treatment and assignment of benefits for services provided during this visit. Patient/Guardian expressed understanding and agreed to proceed.   Coolidge Breeze, LCSW 06/22/2022

## 2022-07-06 ENCOUNTER — Ambulatory Visit (INDEPENDENT_AMBULATORY_CARE_PROVIDER_SITE_OTHER): Payer: Medicaid Other | Admitting: Licensed Clinical Social Worker

## 2022-07-06 ENCOUNTER — Other Ambulatory Visit (HOSPITAL_COMMUNITY): Payer: Self-pay | Admitting: Psychiatry

## 2022-07-06 DIAGNOSIS — F5102 Adjustment insomnia: Secondary | ICD-10-CM | POA: Diagnosis not present

## 2022-07-06 DIAGNOSIS — F331 Major depressive disorder, recurrent, moderate: Secondary | ICD-10-CM | POA: Diagnosis not present

## 2022-07-06 DIAGNOSIS — F411 Generalized anxiety disorder: Secondary | ICD-10-CM | POA: Diagnosis not present

## 2022-07-06 DIAGNOSIS — F53 Postpartum depression: Secondary | ICD-10-CM

## 2022-07-06 NOTE — Progress Notes (Signed)
Virtual Visit via Video Note  I connected with Carol Wright on 07/06/22 at  9:00 AM EDT by a video enabled telemedicine application and verified that I am speaking with the correct person using two identifiers.  Location: Patient: Stationary car Provider: office   I discussed the limitations of evaluation and management by telemedicine and the availability of in person appointments. The patient expressed understanding and agreed to proceed.   I discussed the assessment and treatment plan with the patient. The patient was provided an opportunity to ask questions and all were answered. The patient agreed with the plan and demonstrated an understanding of the instructions.   The patient was advised to call back or seek an in-person evaluation if the symptoms worsen or if the condition fails to improve as anticipated.  I provided 52 minutes of non-face-to-face time during this encounter.   THERAPIST PROGRESS NOTE  Session Time: 9:00 AM to 9:52 AM  Participation Level: Active  Behavioral Response: CasualAlertappropriate  Type of Therapy: Individual Therapy  Treatment Goals addressed: decrease depression, decrease anxiety increase self-esteem, coping  ProgressTowards Goals: Progressing-patient journaling that is been helpful assessed patient gaining more insight about childhood to help her and feeling less guilty about things able to see more mom's part so personalize is less  Interventions: CBT, Solution Focused, Strength-based, Supportive, Reframing, and Other: copoing  Summary: Carol Wright is a 37 y.o. female who presents with adjusting still to living with grandparents. Kids are enjoying them, grandfather is loving it. It is a lot easier if need something don't have to drop something and do what they need. Already the phone not ringing as much. Having a hard time setting boundaries owe her because live there. Have to drop everything every other minute don't want to be rude. Find  her parenting her kids doesn't bother her but when she tells patient then she is acting like patient is nanny and she is the mom. Tried to talk to her. Theron Arista tried too explaining they are  helping and sit there and supervise. She says she has to help but not helping really. She is strong willed. She very much the boss she had master's in business human relations, dealt with union's.  Therapist noted what is reasonable and she still wants to do it her way and used to being in charge, still the one in charge. The issue becomes she does it in front of [place] patient stuck in middle when she is telling what to do with the kids. Have to deal with hm upset when alone, have to deal with patient herself  being upset when in the minute when she is telling her what to do. She has helped them so much she feels she owes them. Patient recognizes it is difficult position to be in. Looking for a place had more of a private area for them wouldn't be so difficult. Move up [place]. More space for all of them. Close but separate. Mom talking about patient being at their house and take over. Plan was for Grandma to buy land now don't want her to buy land because family think patient taking everything. Also Mom would take half of it since parents bought it. Grandfather dementia progressing doctor end of life care. Mom is not here and patient is going to go with them she is being weird about power of attorney thing. She thinks she should be the first choice and delegate to patient. She says  Theron Arista is trying to [place] them out of  everything because he comes from nothing. Therapist asks What is the truth? Frustrating and she comes off as hypocrite is nice to patient but doesn't care. If did wouldn't say anything negative about him. Dynamics are changing sister taking mom's side. When grandparents go will be just them. Mom doesn't like kids.   Therapist discussed how Mom doesn't deserve to be power of attorney given what therapist knows and  patient agrees. Mom brings up so many things in past fresh in grandfather's mind because of dementia he and grandmother not getting along. He has gotten more aggressive didn't see it because didn't live there. She brings up things he is bringing up. Where come up fresh in his mind telling them the way Mom  would say, evident mom reminded him of past. That includes Grandmother having affairs and making assumptions and mom wasn't there. Mom is stirring the pot in very negative ways. Patient thought that is what she has done a lot entire life and asks why letting her influence her now that adult kid didn't know better. Patient had guilt for things and realizes mom not there to nuture and protect. Journal helping write what feeling once done writing read and answer questions. Upset where at in life. Realizes in position in because she chose it. Can't blame anyone but herself. However therapist and introducing challenging belief worksheet noted ways patient is beginning now to say a bigger picture. Mom influenced her decision by putting  guilt on patient how could she leave her, patient ungrateful leave her with sister nobody to care for. Realize was not supposed to mother sister. It wasn't patient's job.  Blame herself for things done in life it was her decision to stay but if had left would be bad daughter selfish stayed and unhappy now with who she is. This wasn't life wanted. Married and responsibilities. She has bee responsible and attended to someone else did entire life. Marriage convenience she had health insurance and he had heart issues. Somewhere convinced herself stay married. He wanted to stay married. He wanted to be a father. Didn't want to be mom. He said when married stay marry forever patient says she feels stuck. Here she is with three kids loves her kids for the first time in life wanted to be selfish. Says this is her  bed and have to live with it. Most of life given to others. Feels trapped. Will  write what she is feeling reread it that didn't make sense, feel that way is silly makes her feel better. Stressing about things that wasn't that important.  Therapist noted she is learning to be her own therapist  Assessed very good week for working on patient's issues.  Discussed the challenges of living with grandparents the power dynamic is a difficult situation.  Therapist encouraged her setting boundaries around parenting.  Also patient's problem solving very good plan to finally and and both have independent places to stay.  Discussed difficulties of negotiating things with mom for example mom having a hard time with patient being power of attorney.  Therapist pointing out to look at the truth of the situation mom has not been involved the way patient has with taking care of them.  Noted journaling helpful for patient challenging thoughts therapist added to thought challenges introducing challenging beliefs worksheet that asked things like what is the source of the information MI looking at the whole situation is her apart that I am missing to help and challenging.  Therapist noted patient's  significant insight to feel less guilty and bad about things to be able to have a bigger picture that mom was not there to nurture and take care of them and put that responsibility on her no reason to feel guilty.  Looked at how patient not having the life that she wanted therapist guided patient and looking at life as plan a not working then make the most out of plan be often happens on many life trajectories does not turn out the way you want but to make the most out of it.  Therapist provided space and support for patient to talk about thoughts and feelings in session.     Suicidal/Homicidal: No  Plan: Return again in 2 weeks.2.  Can look at chapter 6 stop overthinking, look at thoughts and emotions workbook look at hot thoughts, look at shadow workbook  Diagnosis: Major depressive disorder, moderate,  recurrent, generalized anxiety disorder  Collaboration of Care: Other none needed  Patient/Guardian was advised Release of Information must be obtained prior to any record release in order to collaborate their care with an outside provider. Patient/Guardian was advised if they have not already done so to contact the registration department to sign all necessary forms in order for [place] to release information regarding their care.   Consent: Patient/Guardian gives verbal consent for treatment and assignment of benefits for services provided during this visit. Patient/Guardian expressed understanding and agreed to proceed.   Coolidge Breeze, [place] 07/06/2022

## 2022-07-20 ENCOUNTER — Encounter (HOSPITAL_COMMUNITY): Payer: Self-pay | Admitting: Psychiatry

## 2022-07-20 ENCOUNTER — Telehealth (INDEPENDENT_AMBULATORY_CARE_PROVIDER_SITE_OTHER): Payer: Medicaid Other | Admitting: Psychiatry

## 2022-07-20 DIAGNOSIS — F331 Major depressive disorder, recurrent, moderate: Secondary | ICD-10-CM | POA: Diagnosis not present

## 2022-07-20 DIAGNOSIS — F411 Generalized anxiety disorder: Secondary | ICD-10-CM | POA: Diagnosis not present

## 2022-07-20 NOTE — Progress Notes (Signed)
[facility] Follow up visit  Patient Identification: Carol Wright MRN:  [id number] Date of Evaluation:  07/20/2022 Referral Source: Corrie Dandy Counsellor Chief Complaint:   No chief complaint on file. Follow up depression, post partum Visit Diagnosis:    ICD-10-CM   1. Major depressive disorder, recurrent episode, moderate (HCC)  F33.1     2. Generalized anxiety disorder  F41.1      Virtual Visit via Video Note  I connected with Elam Dutch on 07/20/22 at 10:30 AM EDT by a video enabled telemedicine application and verified that I am speaking with the correct person using two identifiers.  Location: Patient: home Provider: home office   I discussed the limitations of evaluation and management by telemedicine and the availability of in person appointments. The patient expressed understanding and agreed to proceed.    I discussed the assessment and treatment plan with the patient. The patient was provided an opportunity to ask questions and all were answered. The patient agreed with the plan and demonstrated an understanding of the instructions.   The patient was advised to call back or seek an in-person evaluation if the symptoms worsen or if the condition fails to improve as anticipated.  I provided 15 minutes of non-face-to-face time during this encounter.     History of Present Illness: Patient is a 37 years old currently married female she has 3 kids youngest is 72 years of age she is a homemaker.  Referred initially by primary care physician on-call start to establish for her depression and postpartum possible depression  Doing fair , takes care of  kids, some stress fiancial, husband is working  Corrie Dandy has helped in therapy to deal with stress Doing fair in regard to sleep Feel inattentive, says daughter has adhd We discussed distractions and overall can be part of underlying depression or anxiety She is not in school or job, still can get ADHD tested      Aggravating factors:  taking care of grand parents, mom doesn't take much responsibility, grand pa has dementia  Modifying factor: marriage, kids Duration adult life Severity  [facility] admission denies Drug use denies   Past Psychiatric History: depression  Previous Psychotropic Medications: Yes   Substance Abuse History in the last 12 months:  No.  Consequences of Substance Abuse: NA  Past Medical History:  Past Medical History:  Diagnosis Date   Depression    Generalized anxiety disorder    H/O arterial dissection    Hepatic steatosis    Via ultrasound.     History of pre-eclampsia    Hyperlipidemia    Myocardial infarction (HCC) 2013   Obesity (BMI 30-39.9)    PCOS (polycystic ovarian syndrome)    Pre-eclampsia    Spontaneous dissection of coronary artery    a. occurred in 2013 during pregnany. Underwent placement of BMS per the patient's report.     Past Surgical History:  Procedure Laterality Date   Arterial disection  03/17/2011   Bare metal stent  03/17/2011   CESAREAN SECTION  03/17/2011   CESAREAN SECTION N/A 10/07/2020   Procedure: CESAREAN SECTION;  Surgeon: Venora Maples, MD;  Location: [facility] OR;  Service: Obstetrics;  Laterality: N/A;   cholcyst     CHOLECYSTECTOMY      Family Psychiatric History: sister: depression  Family History:  Family History  Problem Relation Age of Onset   Hypertension Mother    Hypertension Maternal Grandmother    Heart attack Maternal Grandfather     Social History:  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  Other Topics Concern   Not on file  Social History Narrative   Not on file   Social Determinants of Health   Financial Resource Strain: Not on file  Food Insecurity: No Food Insecurity (09/05/2020)   Hunger Vital Sign    Worried About Running Out of Food in the Last Year: Never true    Ran Out of Food in the Last Year: Never true  Transportation Needs: No Transportation Needs (09/05/2020)   PRAPARE - Administrator, Civil Service (Medical): No    Lack of Transportation (Non-Medical): No  Physical Activity: Not on file  Stress: Not on file  Social Connections: Not on file     Allergies:   Allergies  Allergen Reactions   Ace Inhibitors     cough    Metabolic Disorder Labs: Lab Results  Component Value Date   HGBA1C 4.5 03/25/2020   MPG 82 03/25/2020   MPG 82 11/27/2019   No results found for: "PROLACTIN" Lab Results  Component Value Date   CHOL 154 12/01/2021   TRIG 89 12/01/2021   HDL 41 (L) 12/01/2021   CHOLHDL 3.8 12/01/2021   VLDL 32 06/20/2013   LDLCALC 95 12/01/2021   LDLCALC 128 (H) 11/11/2020   Lab Results  Component Value Date   TSH 2.14 12/01/2021    Therapeutic Level Labs: No results found for: "LITHIUM" No results found for: "CBMZ" No results found for: "VALPROATE"  Current Medications: Current Outpatient Medications  Medication Sig Dispense Refill   amLODipine (NORVASC) 5 MG tablet Take 5 mg by mouth daily.     aspirin 81 MG chewable tablet Chew by mouth daily.     buPROPion (WELLBUTRIN XL) 300 MG 24 hr tablet Take 1 tablet (300 mg total) by mouth daily. 90 tablet 1   cetirizine (ZYRTEC) 10 MG tablet Take 10 mg by mouth daily.     chlorthalidone (HYGROTON) 25 MG tablet Take 25 mg by mouth daily.     Insulin Pen Needle 31G X 6 MM MISC To use with saxenda pen. 60 each 0   levonorgestrel (LILETTA, 52 MG,) 20.1 MCG/DAY IUD 1 each by Intrauterine route once.     Liraglutide -Weight Management (SAXENDA) 18  MG/3ML SOPN Inject 3 mg into the skin daily. 0.6 mg inj subcut daily for 1 week, then incr by 0.6 mg weekly until reaching 3 mg injected subcut daily 15 mL 0   metFORMIN (GLUCOPHAGE) 500 MG tablet Take 1 tablet (500 mg total) by mouth 2 (two) times daily with a meal. 180 tablet 1   sertraline (ZOLOFT) 100 MG tablet Take 1 tablet by mouth once daily 90 tablet 0   traZODone (DESYREL) 50 MG tablet Take 0.5-1 tablets (25-50 mg total) by mouth at bedtime as needed for sleep. 30 tablet 2   WEGOVY 0.25 MG/0.5ML SOAJ Inject 0.25 mg into the skin once a week. Use this dose  for 1 month (4 shots) and then increase to next higher dose. (Patient not taking: Reported on 12/01/2021) 2 mL 0   WEGOVY 0.5 MG/0.5ML SOAJ Inject 0.5 mg into the skin once a week. Use this dose for 1 month (4 shots) and then increase to next higher dose. (Patient not taking: Reported on 12/01/2021) 2 mL 1   No current facility-administered medications for this visit.     Psychiatric Specialty Exam: Review of Systems  Cardiovascular:  Negative for chest pain.  Neurological:  Negative for tremors.  Psychiatric/Behavioral:  Negative for agitation.     unknown if currently breastfeeding.There is no height or weight on file to calculate BMI.  General Appearance: Casual  Eye Contact:  Fair  Speech:  Normal Rate  Volume:  Decreased  Mood:  fair  Affect:  Constricted  Thought Process:  Goal Directed  Orientation:  Full (Time, Place, and Person)  Thought Content:  Rumination  Suicidal Thoughts:  No  Homicidal Thoughts:  No  Memory:  Immediate;   Fair  Judgement:  Fair  Insight:  Fair  Psychomotor Activity:  Decreased  Concentration:  Concentration: Fair  Recall:  [employer] of Knowledge:Fair  Language: Fair  Akathisia:  No  Handed:    AIMS (if indicated):  not done  Assets:  Financial Resources/Insurance Housing Physical Health  ADL's:  Intact  Cognition: WNL  Sleep:   irregular at times   Screenings: GAD-7     Flowsheet Row Office Visit from 12/01/2021 in [facility] Primary Care & Sports Medicine at [facility] Office Visit from 05/19/2021 in [facility] Primary Care & Sports Medicine at [facility] Office Visit from 11/11/2020 in [facility] Primary Care & Sports Medicine at [facility] Clinical Support from 09/30/2020 [facility] Clinical Support from 09/12/2020 in [facility]  Total GAD-7 Score 6 12 13  0 0      PHQ2-9    Flowsheet Row Office Visit from 12/01/2021 in [facility] Primary Care & Sports Medicine at [facility] Video Visit from 10/13/2021 in [facility] Outpatient Behavioral Health at [facility] Counselor from 07/09/2021 in [facility] Outpatient Behavioral Health at [facility] Office Visit from 05/19/2021 in [facility] Primary Care & Sports Medicine at [facility] Office Visit from 11/11/2020 in [facility] Primary Care & Sports Medicine at [facility]  PHQ-2 Total Score 3 2 3 2 3   PHQ-9 Total Score 13 10 18 9 16       Flowsheet Row Video Visit from 01/12/2022 in [facility] Outpatient Behavioral Health at [facility] Video Visit from 10/13/2021 in [facility] Outpatient Behavioral Health at [facility] Counselor from 07/09/2021 in [facility] Outpatient Behavioral Health at [facility]  C-SSRS RISK CATEGORY No Risk No Risk Low Risk       Assessment and Plan: as follows  Prior documentation reviewed  MDD recurrent moderate:fair conitnue meds  GAD: manageable continue zoloft, add ME time  Post partum depression: doing fair with zoloft and wellbutrin Insomnia: manageable with trazadone, can continue    Refills if due were sent  Fu 37m.    Collaboration of Care: Other primary care and counsellor notes and meds reviewed  Patient/Guardian was advised  Release of Information must be obtained prior to any record release in order to collaborate their care with an outside provider. Patient/Guardian was advised if they have not already done so to  contact the registration department to sign all necessary forms in order for [place] to release information regarding their care.   Consent: Patient/Guardian gives verbal consent for treatment and assignment of benefits for services provided during this visit. Patient/Guardian expressed understanding and agreed to proceed.   Thresa Ross, MD 5/6/202410:36 AM

## 2022-07-27 ENCOUNTER — Ambulatory Visit (INDEPENDENT_AMBULATORY_CARE_PROVIDER_SITE_OTHER): Payer: Medicaid Other | Admitting: Licensed Clinical Social Worker

## 2022-07-27 DIAGNOSIS — F411 Generalized anxiety disorder: Secondary | ICD-10-CM

## 2022-07-27 DIAGNOSIS — F331 Major depressive disorder, recurrent, moderate: Secondary | ICD-10-CM | POA: Diagnosis not present

## 2022-07-27 NOTE — Progress Notes (Signed)
Virtual Visit via Video Note  I connected with Carol Wright on 07/27/22 at  9:00 AM EDT by a video enabled telemedicine application and verified that I am speaking with the correct person using two identifiers.  Location: Patient: Stationary car Provider: office   I discussed the limitations of evaluation and management by telemedicine and the availability of in person appointments. The patient expressed understanding and agreed to proceed.   I discussed the assessment and treatment plan with the patient. The patient was provided an opportunity to ask questions and all were answered. The patient agreed with the plan and demonstrated an understanding of the instructions.   The patient was advised to call back or seek an in-person evaluation if the symptoms worsen or if the condition fails to improve as anticipated.  I provided 52 minutes of non-face-to-face time during this encounter.  THERAPIST PROGRESS NOTE  Session Time: 9:00 AM to 9:52 AM  Participation Level: Active  Behavioral Response: CasualAlertappropriate  Type of Therapy: Individual Therapy  Treatment Goals addressed:  decrease depression, decrease anxiety increase self-esteem, coping  ProgressTowards Goals: Progressing-processed thoughts and feelings in session also explored more on mindfulness to help patient with challenging cognitive distortions  Interventions: Solution Focused, Strength-based, Supportive, and Other: Relationship skills, mindfulness  Summary: Carol Wright is a 37 y.o. female who presents with has been good challenging at times good overall.  Grandmother very much involved with what ever she does and it is her home wants to be part of what they do would like alone time there is none. Don't know if she is lonely doesn't have a close relationship with mom who is negative and toxic listen to her stories but would have some alone time and always there. Hesitate to say something don't want hurt her  feelings. Taxing and wearing her down. Therapist guided patient to say it in a nice way she might not know. Patient says recently put foot down recently during the week avoid going out difficult with the kids. Can't go shopping by herself and watch the kids. Grandmother in routine need to go out shopping things could wait until [place] home. Insignificant things and she makes it a priority. Described an incident where she was stressed grandmother disappeared with Carol Wright took her an hour to find them very frustrated. Angry that she walked away with kid that she won't watch. Was worried something bad could happen. Patient said not going to happen if emergency situation ask Carol Wright can't go out and watch her and the kids it is a lot. Therapist think setting boundaries is positive and encourage more. Carol Wright does too it is her grandmother and patient feels owe her. Her husband tells her not staying for free don't owe her anything. Explored what she would do with the break actively spend time in the garden. An hour where don't think. Music going and task doing and nothing else. Can't listen to music she talks a lot. She calls for everything even if insignificant in the middle of something and says emergency. Can't go on her own have to take the kids. Even if set boundaries grandmother would respect them anywhere. It is like they are always there. It is home feels bad their home feel bad say upstairs and not come down. Sunday all work outside and Monday running errands. At a point where can't leave them. Better if either her or husband stay and other goes out.  Explored some of the positives of being there. A  good thing Sunday  breakfast and dinner nice. Always made a point to have that. Enjoy that see kids interacting with grandparents. Know making memories. Enjoy Sundays the most.    Reviewed with patient more on mindfulness to show how the practice helps in having a more objective view. Talked about the concept of right  few things things without subjectivity.  Noted patient practicing this with journal writing able to look back and challenge some of the thoughts she had actually state herself what was I thinking?  Therapist utilized motivational interviewing strategies to encourage patient to set more boundaries would be good for her also think would help in changing dynamic and good for everybody.  Assess helpful for patient to process thoughts and feelings in session helps with coping and managing her stressors.  Therapist provided more information on mindfulness and explained how related to what were working on.  Mindfulness will help her in getting more objective you just looking at thoughts and emotions as data this will help her noticed more preconceptions looking a book on mindfulness blocking views that come from experiences core beliefs.  Being able to notice them more being able to look in the present and see the difference between present in the past.  Noticed mindfulness also happening with neuroplasticity and helping the brain also with emotional regulation.  Patient uses her journal and she uses it to notice these blocking views mindfulness will be another tool for her to notice and will expand on this next session.  Reviewed treatment plan patient gave consent to complete virtually Suicidal/Homicidal: No  Plan: Return again in 3 weeks.2.  Look more at mindfulness from EMDR book, can look at chapter 6 stop overthinking, thoughts and feelings workbook at thoughts, shadow workbook 3.  Working with patient feeling stuck feeling somehow she is to blame reviewing history and how mom put her into the role of caretaker  Diagnosis: Major depressive disorder, moderate, recurrent, generalized anxiety disorder   Collaboration of Care: Medication Management AEB review of Dr. Gilmore Laroche last note  Patient/Guardian was advised Release of Information must be obtained prior to any record release in order to collaborate their  care with an outside provider. Patient/Guardian was advised if they have not already done so to contact the registration department to sign all necessary forms in order for [place] to release information regarding their care.   Consent: Patient/Guardian gives verbal consent for treatment and assignment of benefits for services provided during this visit. Patient/Guardian expressed understanding and agreed to proceed.   Coolidge Breeze, LCSW 07/27/2022

## 2022-08-06 IMAGING — US US MFM FETAL BPP W/O NON-STRESS
1 series · 13 of 28 positions shown · non-contrast
Comparison: none

[Series 1: us mfm fetal bpp w/o non-stress · 60 acquisitions, 13 frames shown]
[im 3/60]
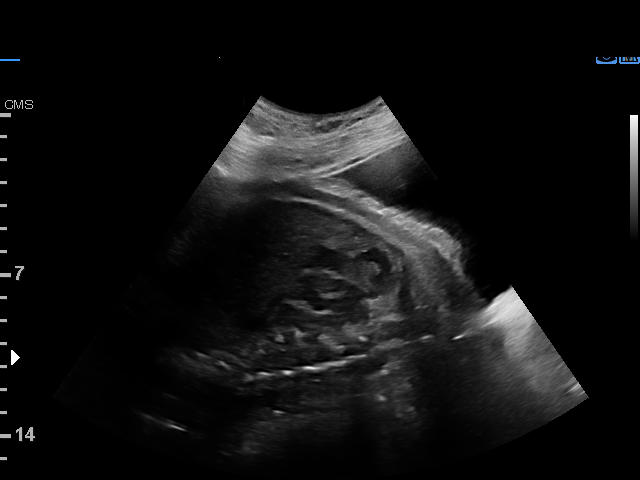
[im 7/60]
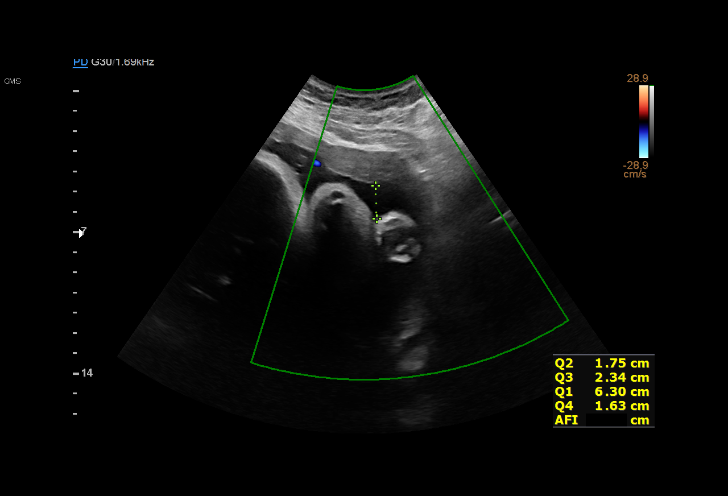
[im 11/60]
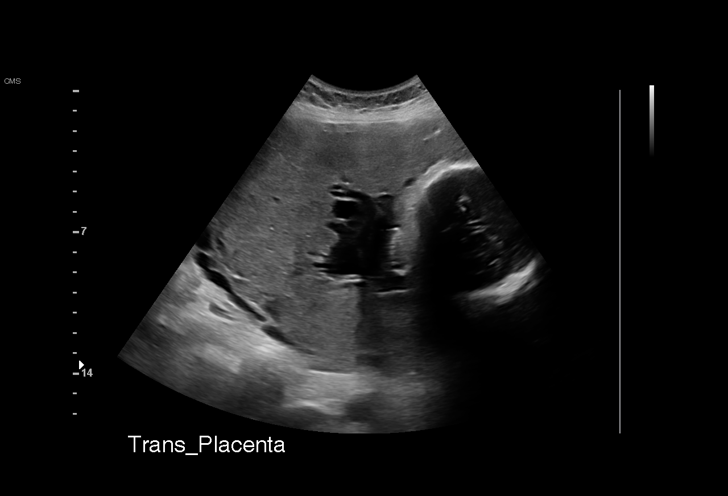
[im 16/60]
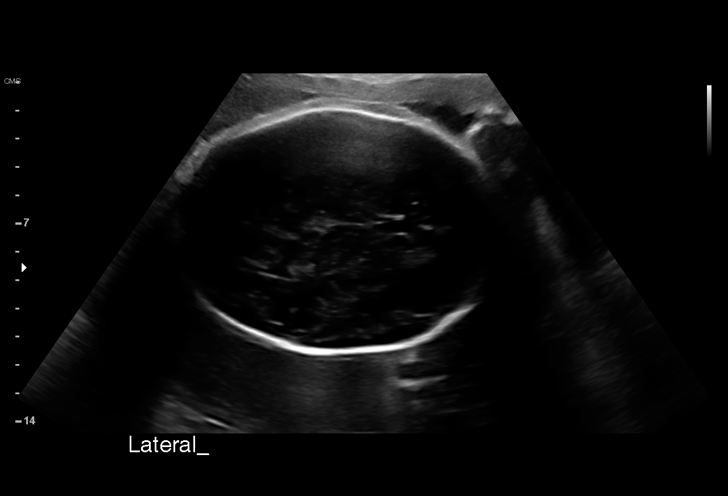
[im 20/60]
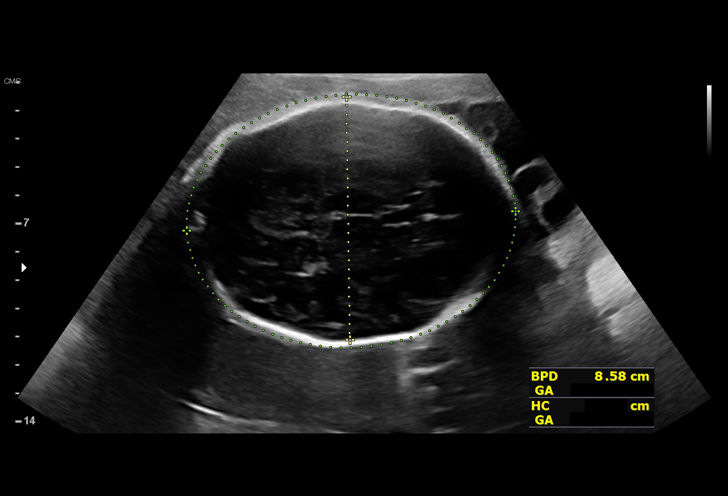
[im 25/60]
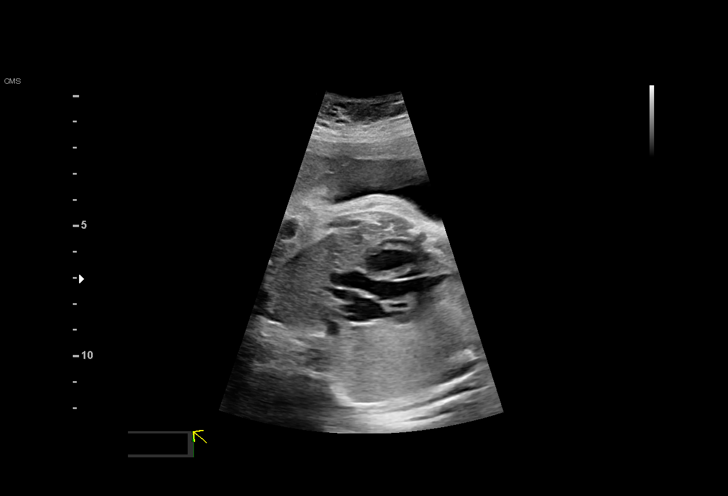
[im 31/60]
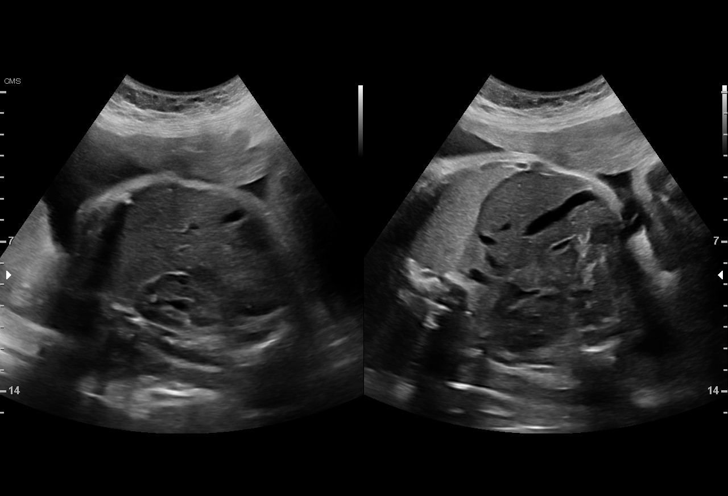
[im 35/60]
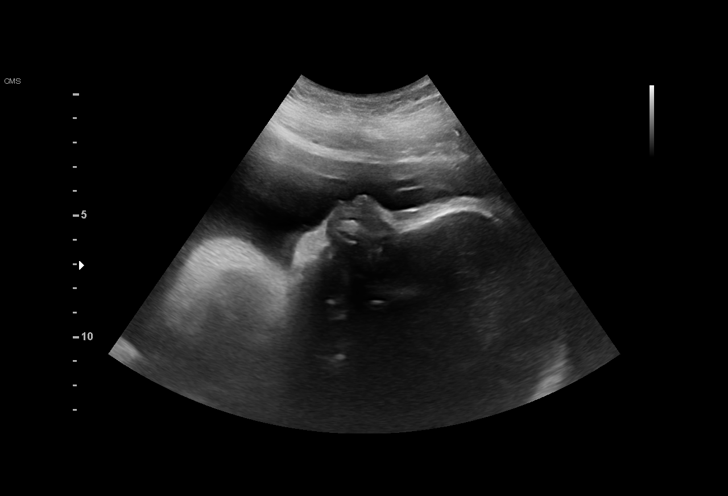
[im 40/60]
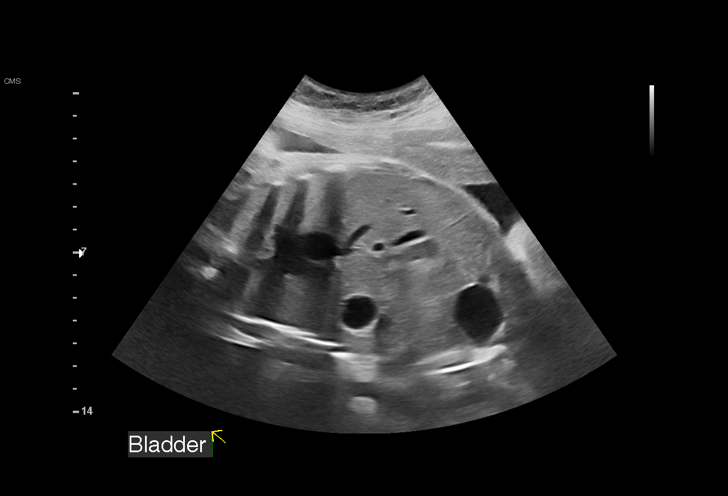
[im 44/60]
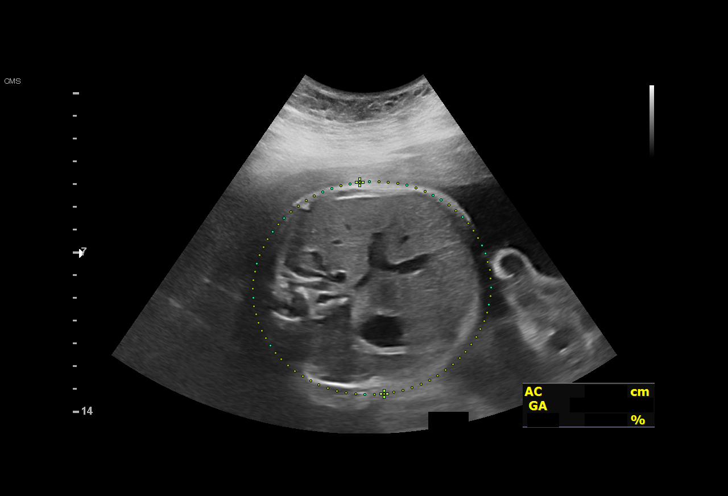
[im 49/60]
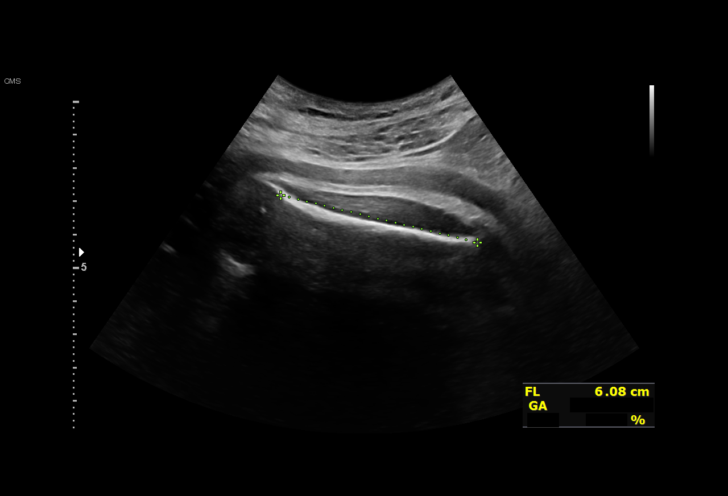
[im 53/60]
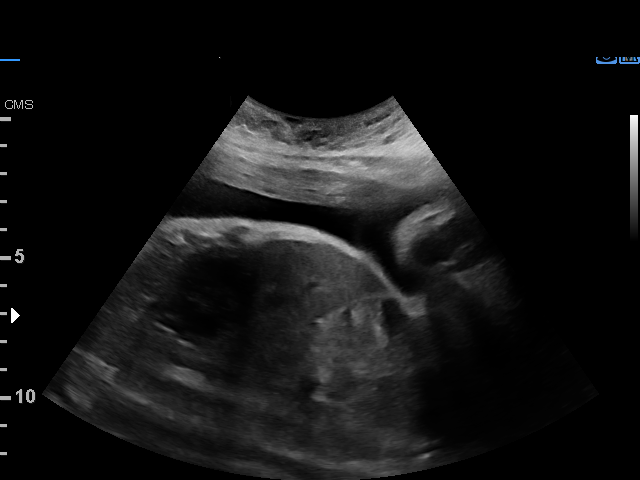
[im 57/60]
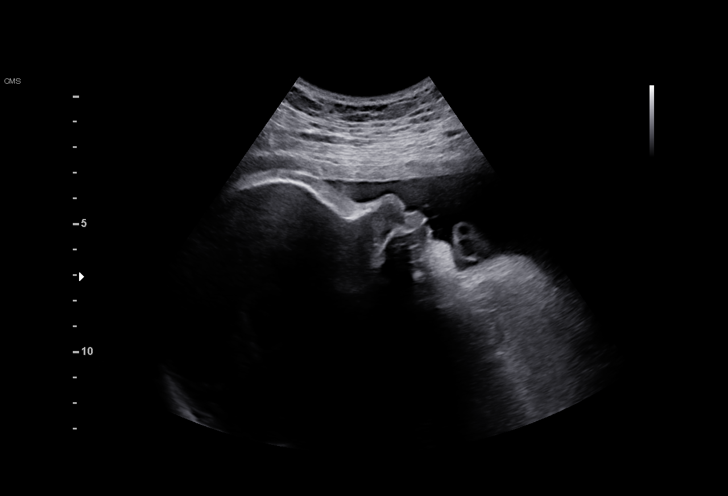

[13 of 28 positions shown; findings below may reference images not displayed]

Indications

 Medical complication of pregnancy (PCOS -
 metformin)(History MI, dissection/aneurysm -
 labetalol)
 32 weeks gestation of pregnancy
 Previous cesarean delivery, antepartum x 2
 Low risk NIPS, neg Horizon
Fetal Evaluation

 Num Of Fetuses:         1
 Fetal Heart Rate(bpm):  155
 Cardiac Activity:       Observed
 Presentation:           Breech
 Placenta:               Posterior
 P. Cord Insertion:      Previously Visualized

 Amniotic Fluid
 AFI FV:      Within normal limits

 AFI Sum(cm)     %Tile       Largest Pocket(cm)
 12.             32

 RUQ(cm)       RLQ(cm)       LUQ(cm)        LLQ(cm)

Biophysical Evaluation
 Amniotic F.V:   Within normal limits       F. Tone:        Observed
 F. Movement:    Observed                   Score:          [DATE]
 F. Breathing:   Observed
Biometry

 BPD:      85.6  mm     G. Age:  34w 4d         89  %    CI:        69.45   %    70 - 86
                                                         FL/HC:      18.6   %    19.9 -
 HC:      327.9  mm     G. Age:  37w 2d         98  %    HC/AC:      1.06        0.96 -
 AC:      310.7  mm     G. Age:  35w 0d         96  %    FL/BPD:     71.1   %    71 - 87
 FL:       60.9  mm     G. Age:  31w 4d         14  %    FL/AC:      19.6   %    20 - 24
 HUM:      55.3  mm     G. Age:  32w 1d         45  %
 LV:        3.2  mm

 Est. FW:    1374  gm      5 lb 4 oz     86  %
OB History

 Gravidity:    3         Term:   2        Prem:   0        SAB:   0
 TOP:          0       Ectopic:  0        Living: 2
Gestational Age

 LMP:           32w 5d        Date:  01/10/20                 EDD:   10/16/20
 U/S Today:     34w 4d                                        EDD:   10/03/20
 Best:          32w 5d     Det. By:  LMP  (01/10/20)          EDD:   10/16/20
Anatomy

 Cranium:               Appears normal         Aortic Arch:            Previously seen
 Cavum:                 Appears normal         Ductal Arch:            Previously seen
 Ventricles:            Appears normal         Diaphragm:              Appears normal
 Choroid Plexus:        Appears normal         Stomach:                Appears normal, left
                                                                       sided
 Cerebellum:            Appears normal         Abdomen:                Previously seen
 Posterior Fossa:       Previously seen        Abdominal Wall:         Previously seen
 Nuchal Fold:           Previously seen        Cord Vessels:           Previously seen
 Face:                  Appears normal         Kidneys:                Appear normal
                        (orbits and profile)
 Lips:                  Appears normal         Bladder:                Appears normal
 Thoracic:              Previously seen        Spine:                  Previously seen
 Heart:                 Appears normal         Upper Extremities:      Previously seen
                        (4CH, axis, and
                        situs)
 RVOT:                  Appears normal         Lower Extremities:      Previously seen
 LVOT:                  Appears normal

 Other:  Heels/feet and open hands/5th digits prev visualized. Lenses
         visualized. Nasal bone  visualized. SVC/IVC prev visualized.
         Technically difficult due to maternal habitus and fetal position.
Cervix Uterus Adnexa

 Cervix
 Not visualized (advanced GA >53wks)
 Uterus
 No abnormality visualized.

 Right Ovary
 Not visualized.

 Left Ovary
 Not visualized.

 Cul De Sac
 No free fluid seen.

 Adnexa
 No abnormality visualized.
Comments

 This patient was seen for a follow up growth scan due to a
 history of a prior MI and aortic dissection.  She is currently
 treated with labetalol.  She denies any problems since her
 last exam.
 She was informed that the fetal growth and amniotic fluid
 level appears appropriate for her gestational age.
 A biophysical profile performed today was [DATE].
 She will continue weekly biophysical profiles in your office.
 A follow up exam was scheduled in our office in 4 weeks.

## 2022-08-11 ENCOUNTER — Ambulatory Visit (HOSPITAL_COMMUNITY): Payer: Medicaid Other | Admitting: Licensed Clinical Social Worker

## 2022-08-12 ENCOUNTER — Ambulatory Visit (HOSPITAL_COMMUNITY): Payer: Medicaid Other | Admitting: Licensed Clinical Social Worker

## 2022-08-15 ENCOUNTER — Other Ambulatory Visit: Payer: Self-pay | Admitting: Physician Assistant

## 2022-08-15 DIAGNOSIS — F331 Major depressive disorder, recurrent, moderate: Secondary | ICD-10-CM

## 2022-08-15 DIAGNOSIS — F411 Generalized anxiety disorder: Secondary | ICD-10-CM

## 2022-08-17 ENCOUNTER — Ambulatory Visit (INDEPENDENT_AMBULATORY_CARE_PROVIDER_SITE_OTHER): Payer: Medicaid Other | Admitting: Licensed Clinical Social Worker

## 2022-08-17 ENCOUNTER — Encounter (HOSPITAL_COMMUNITY): Payer: Self-pay

## 2022-08-17 DIAGNOSIS — F331 Major depressive disorder, recurrent, moderate: Secondary | ICD-10-CM

## 2022-08-17 DIAGNOSIS — F411 Generalized anxiety disorder: Secondary | ICD-10-CM

## 2022-08-17 NOTE — Progress Notes (Signed)
Patient like and so can look at EMDR mindfulness chapter 6 stop overthinking thoughts and feelings workbook chapter 3 shadow workbook DBT workbook

## 2022-09-03 IMAGING — US US MFM OB FOLLOW-UP
1 series · 13 of 28 positions shown · non-contrast
Comparison: none

[Series 1: us mfm ob follow-up · 45 acquisitions, 13 frames shown]
[im 2/45]
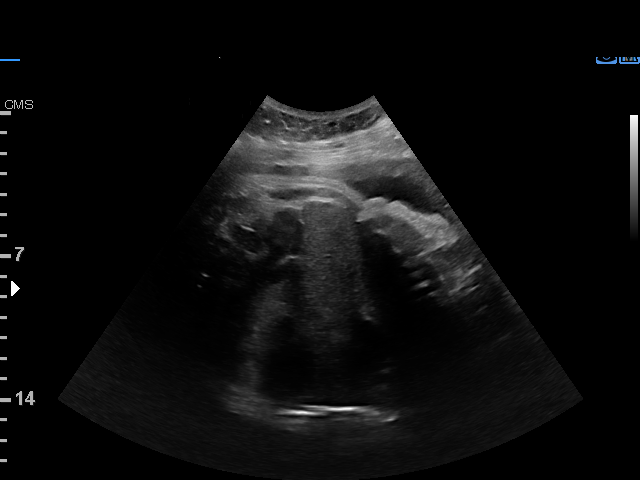
[im 5/45]
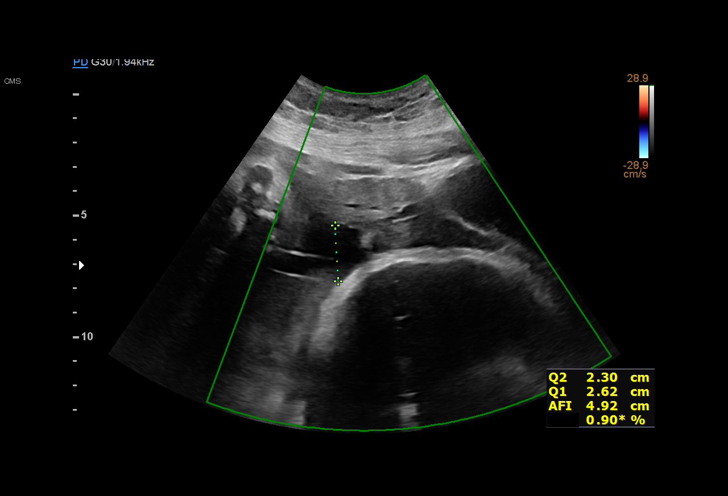
[im 9/45]
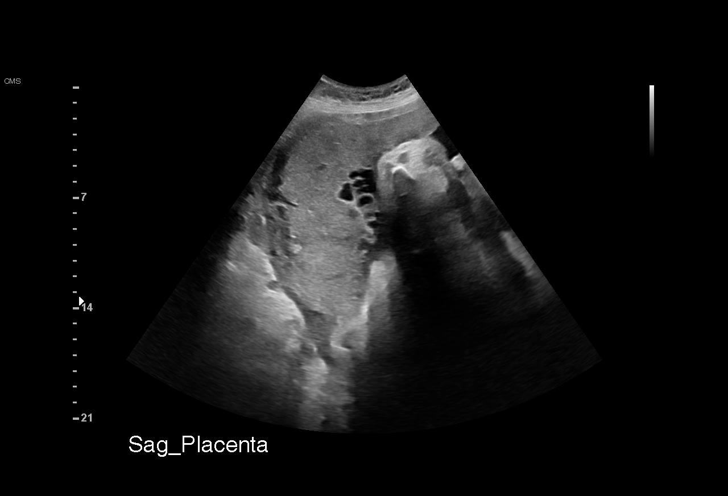
[im 12/45]
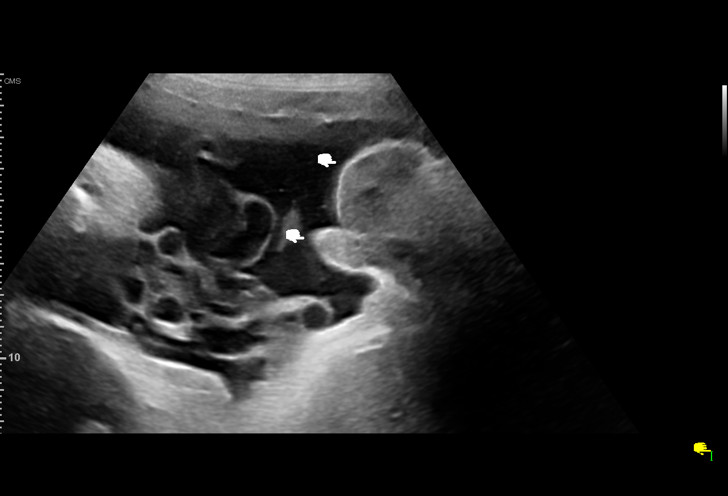
[im 15/45]
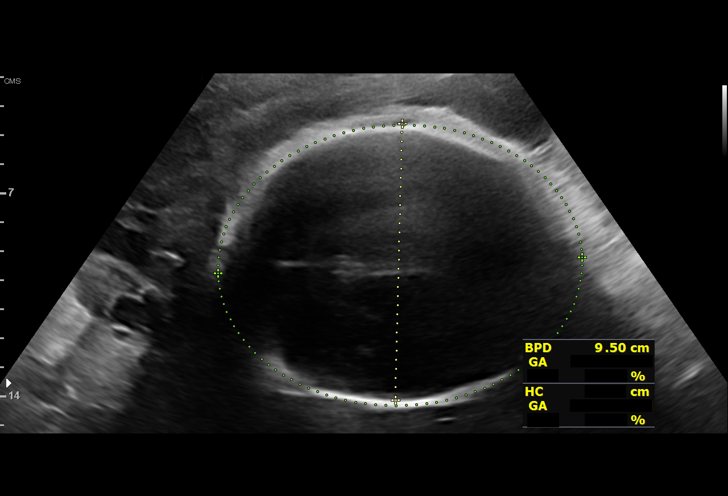
[im 18/45]
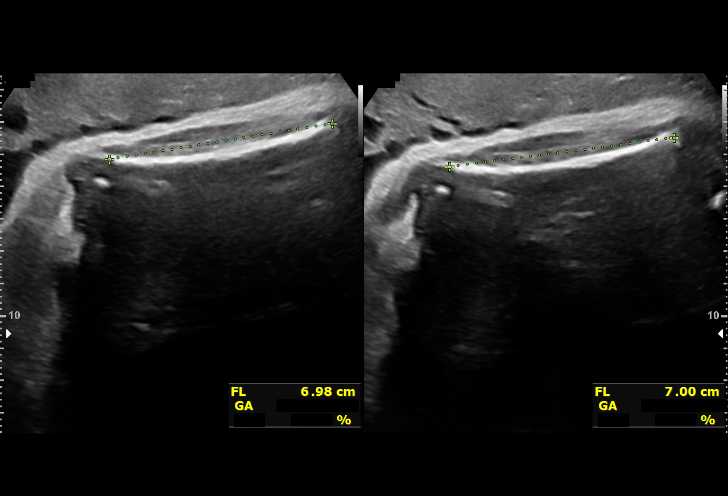
[im 23/45]
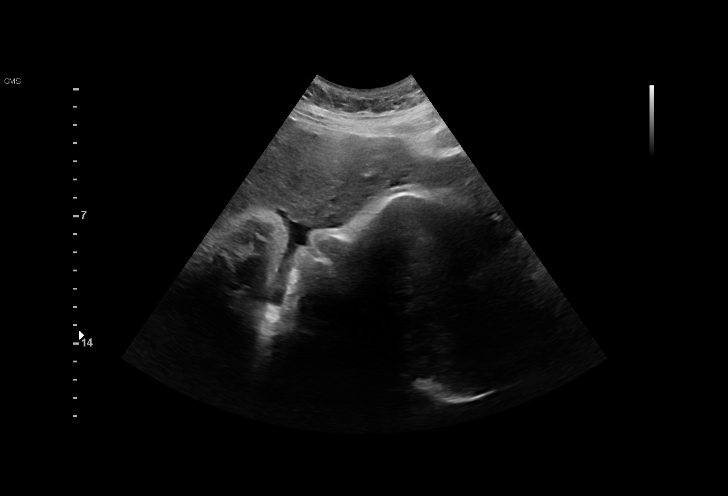
[im 27/45]
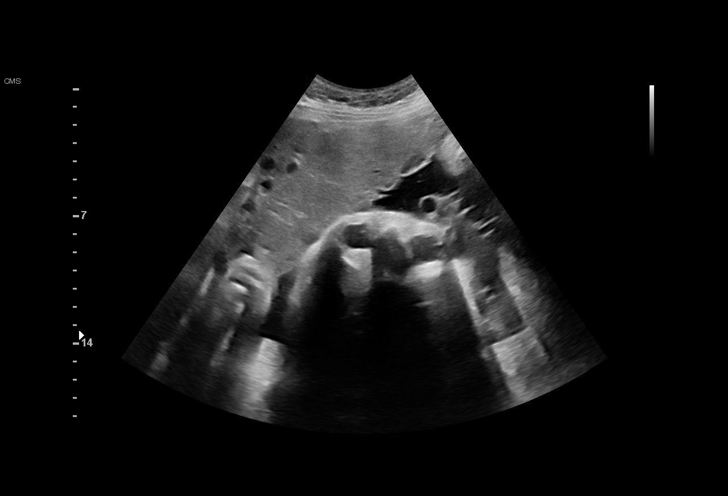
[im 30/45]
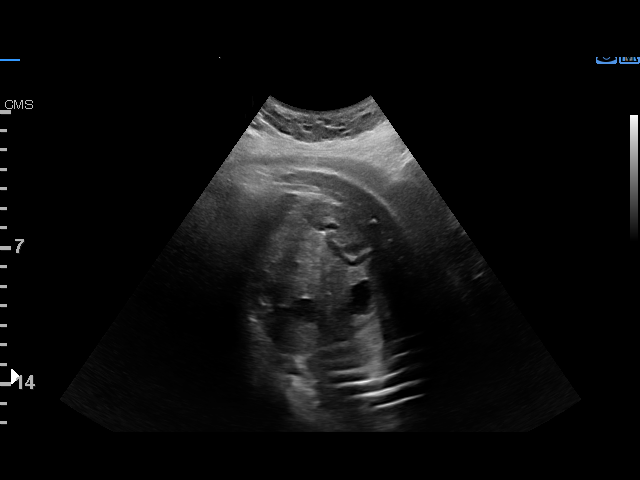
[im 33/45]
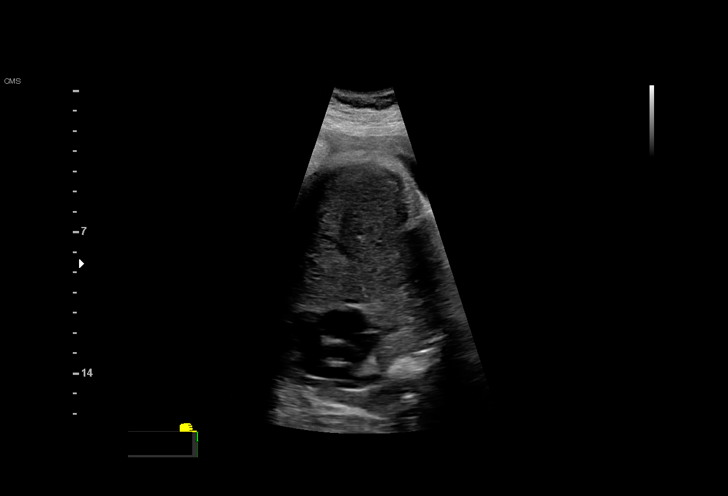
[im 36/45]
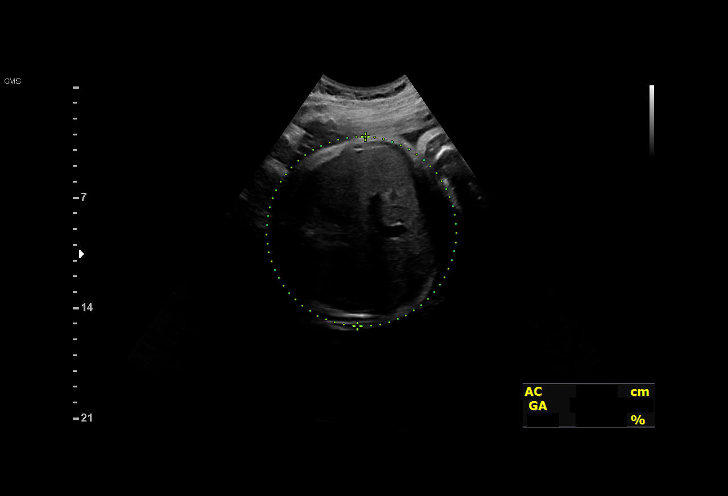
[im 40/45]
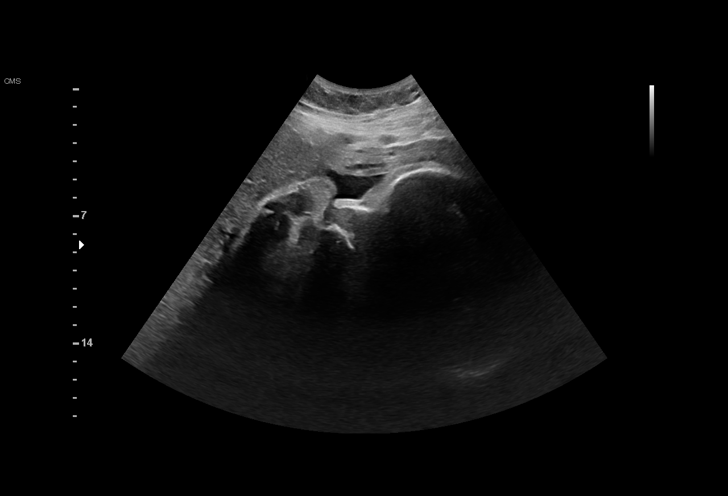
[im 43/45]
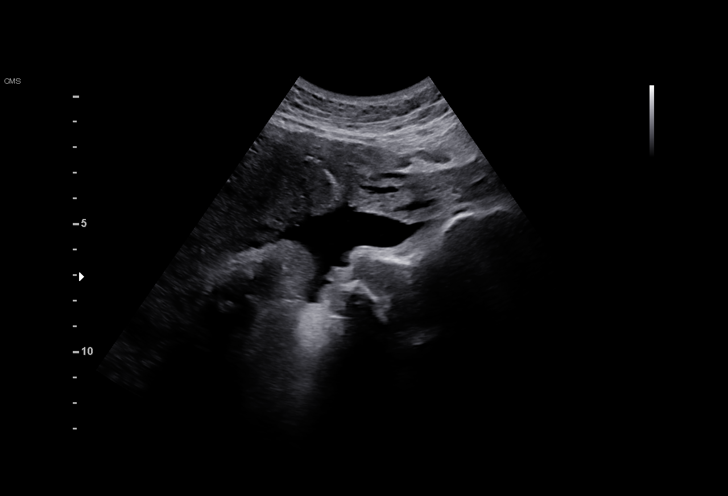

[13 of 28 positions shown; findings below may reference images not displayed]

Indications

 Hypertension - Chronic/Pre-existing
 (labetalol)
 Medical complication of pregnancy (PCOS -
 metformin)(History MI, dissection/aneurysm -
 labetalol)
 36 weeks gestation of pregnancy
 Previous cesarean delivery, antepartum x 2
 Low risk NIPS, neg Horizon
Fetal Evaluation

 Num Of Fetuses:         1
 Fetal Heart Rate(bpm):  145
 Cardiac Activity:       Observed
 Presentation:           Cephalic
 Placenta:               Posterior
 P. Cord Insertion:      Previously Visualized

 Amniotic Fluid
 AFI FV:      Within normal limits

 AFI Sum(cm)     %Tile       Largest Pocket(cm)
 12.6            43

 RUQ(cm)       RLQ(cm)       LUQ(cm)        LLQ(cm)
 2.6           4.7           2.3            3
Biophysical Evaluation

 Amniotic F.V:   Within normal limits       F. Tone:        Observed
 F. Movement:    Observed                   Score:          [DATE]
 F. Breathing:   Observed
Biometry

 BPD:      96.1  mm     G. Age:  39w 2d         99  %    CI:        75.51   %    70 - 86
                                                         FL/HC:      20.0   %    20.8 -
 HC:      350.7  mm     G. Age:  40w 6d         97  %    HC/AC:      0.94        0.92 -
 AC:      371.3  mm     G. Age:  41w 0d       > 99  %    FL/BPD:     73.0   %    71 - 87
 FL:       70.2  mm     G. Age:  36w 0d         29  %    FL/AC:      18.9   %    20 - 24
 HUM:      60.2  mm     G. Age:  35w 0d         34  %

 LV:        3.2  mm

 Est. FW:    7207  gm    8 lb 10 oz    > 99  %
OB History

 Gravidity:    3         Term:   2        Prem:   0        SAB:   0
 TOP:          0       Ectopic:  0        Living: 2
Gestational Age

 LMP:           36w 5d        Date:  01/10/20                 EDD:   10/16/20
 U/S Today:     39w 2d                                        EDD:   09/28/20
 Best:          36w 5d     Det. By:  LMP  (01/10/20)          EDD:   10/16/20
Anatomy

 Cranium:               Appears normal         Aortic Arch:            Previously seen
 Cavum:                 Previously seen        Ductal Arch:            Previously seen
 Ventricles:            Previously seen        Diaphragm:              Previously seen
 Choroid Plexus:        Previously seen        Stomach:                Appears normal, left
                                                                       sided
 Cerebellum:            Previously seen        Abdomen:                Previously seen
 Posterior Fossa:       Previously seen        Abdominal Wall:         Previously seen
 Nuchal Fold:           Previously seen        Cord Vessels:           Previously seen
 Face:                  Appears normal         Kidneys:                Appear normal
                        (orbits and profile)
 Lips:                  Previously seen        Bladder:                Appears normal
 Thoracic:              Previously seen        Spine:                  Previously seen
 Heart:                 Previously seen        Upper Extremities:      Previously seen
 RVOT:                  Appears normal         Lower Extremities:      Previously seen
 LVOT:                  Appears normal

 Other:  Heels/feet and open hands/5th digits prev visualized. Lenses
         visualized. Nasal bone visualized. SVC/IVC prev visualized.
         Technically difficult due to maternal habitus and fetal position.
Cervix Uterus Adnexa

 Cervix
 Not visualized (advanced GA >16wks)
 Uterus
 No abnormality visualized.

 Right Ovary
 Not visualized.

 Left Ovary
 Not visualized.

 Cul De Sac
 No free fluid seen.

 Adnexa
 No abnormality visualized.
Comments

 This patient was seen for a follow up growth scan due to a
 history of a prior MI and aortic dissection.  She is currently
 treated with labetalol.  Her pregnancy has also been
 complicated by gestational diabetes treated with metformin.
 The fetal growth measures greater than the 99th percentile
 for her gestational age.  There was normal amniotic fluid
 noted.
 A biophysical profile performed today was [DATE].
 The patient already has a biophysical profile scheduled in
 your office next week.
 She is scheduled for delivery via repeat C-section on October 07, 2020 (in 2 weeks).
 No further exams were scheduled in our office.

## 2022-09-07 ENCOUNTER — Ambulatory Visit (HOSPITAL_COMMUNITY): Payer: Medicaid Other | Admitting: Licensed Clinical Social Worker

## 2022-09-10 ENCOUNTER — Emergency Department (HOSPITAL_BASED_OUTPATIENT_CLINIC_OR_DEPARTMENT_OTHER)
Admission: EM | Admit: 2022-09-10 | Discharge: 2022-09-10 | Disposition: A | Discharge status: Home / Self Care | Payer: Medicaid Other | Attending: Emergency Medicine | Admitting: Emergency Medicine

## 2022-09-10 ENCOUNTER — Other Ambulatory Visit: Payer: Self-pay

## 2022-09-10 ENCOUNTER — Encounter (HOSPITAL_BASED_OUTPATIENT_CLINIC_OR_DEPARTMENT_OTHER): Payer: Self-pay | Admitting: Emergency Medicine

## 2022-09-10 DIAGNOSIS — Z7982 Long term (current) use of aspirin: Secondary | ICD-10-CM | POA: Insufficient documentation

## 2022-09-10 DIAGNOSIS — R21 Rash and other nonspecific skin eruption: Secondary | ICD-10-CM | POA: Insufficient documentation

## 2022-09-10 MED ORDER — HYDROXYZINE HCL 25 MG PO TABS: 25.0000 mg | ORAL_TABLET | Freq: Three times a day (TID) | ORAL | 0 refills | Status: AC | PRN

## 2022-09-10 MED ORDER — HYDROXYZINE HCL 25 MG PO TABS
25.0000 mg | ORAL_TABLET | Freq: Once | ORAL | Status: AC
  Administered 2022-09-10: 25 mg via ORAL
  Filled 2022-09-10: qty 1

## 2022-09-10 MED ORDER — PREDNISONE 50 MG PO TABS
60.0000 mg | ORAL_TABLET | Freq: Once | ORAL | Status: AC
  Administered 2022-09-10: 60 mg via ORAL
  Filled 2022-09-10: qty 1

## 2022-09-10 MED ORDER — METHYLPREDNISOLONE 4 MG PO TBPK: ORAL_TABLET | ORAL | 0 refills | Status: AC

## 2022-09-10 NOTE — ED Provider Notes (Signed)
[place] [facility] Provider Note   CSN: [id number] Arrival date & time: 09/10/22  6295     History  Chief Complaint  Patient presents with   Rash    Carol Wright is a 37 y.o. female presenting to the emergency department with a rash.  Patient reports that the rash began on her left palm yesterday, with an itchy painful spot between her fingers, and then appeared to rapidly spread up her left arm, and this morning she had a red diffusely itching rash involving her back, face, trunk, and entire body.  No reported fevers and chills.  She has never had a similar reaction.  She reports she has allergies to pet dander, but does not recall any recent exposure.  No sick contacts in the house with similar rashes.  HPI     Home Medications Prior to Admission medications   Medication Sig Start Date End Date Taking? Authorizing Provider  hydrOXYzine (ATARAX) 25 MG tablet Take 1 tablet (25 mg total) by mouth every 8 (eight) hours as needed for up to 21 doses. 09/10/22  Yes Terald Sleeper, MD  methylPREDNISolone (MEDROL DOSEPAK) 4 MG TBPK tablet Take as directed on package 09/11/22  Yes Jenisha Faison, Kermit Balo, MD  amLODipine (NORVASC) 5 MG tablet Take 5 mg by mouth daily. 10/06/21   [provider]  aspirin 81 MG chewable tablet Chew by mouth daily.    [provider]  buPROPion (WELLBUTRIN XL) 300 MG 24 hr tablet Take 1 tablet by mouth once daily 08/17/22   Breeback, Jade L, PA-C  cetirizine (ZYRTEC) 10 MG tablet Take 10 mg by mouth daily.    [provider]  chlorthalidone (HYGROTON) 25 MG tablet Take 25 mg by mouth daily. 04/25/21   [provider]  Insulin Pen Needle 31G X 6 MM MISC To use with saxenda pen. 12/01/21   Breeback, Lonna Cobb, PA-C  levonorgestrel (LILETTA, 52 MG,) 20.1 MCG/DAY IUD 1 each by Intrauterine route once.    [provider]  Liraglutide -Weight Management (SAXENDA) 18 MG/3ML SOPN Inject 3 mg into  the skin daily. 0.6 mg inj subcut daily for 1 week, then incr by 0.6 mg weekly until reaching 3 mg injected subcut daily 12/01/21   Breeback, Jade L, PA-C  metFORMIN (GLUCOPHAGE) 500 MG tablet Take 1 tablet (500 mg total) by mouth 2 (two) times daily with a meal. 05/19/21   Breeback, Jade L, PA-C  sertraline (ZOLOFT) 100 MG tablet Take 1 tablet by mouth once daily 07/07/22   Thresa Ross, MD  traZODone (DESYREL) 50 MG tablet Take 0.5-1 tablets (25-50 mg total) by mouth at bedtime as needed for sleep. 12/01/21   Breeback, Jade L, PA-C  WEGOVY 0.25 MG/0.5ML SOAJ Inject 0.25 mg into the skin once a week. Use this dose for 1 month (4 shots) and then increase to next higher dose. Patient not taking: Reported on 12/01/2021 08/18/21   Tandy Gaw L, PA-C  WEGOVY 0.5 MG/0.5ML SOAJ Inject 0.5 mg into the skin once a week. Use this dose for 1 month (4 shots) and then increase to next higher dose. Patient not taking: Reported on 12/01/2021 09/17/21   Jomarie Longs, PA-C      Allergies    Ace inhibitors    Review of Systems   Review of Systems  Physical Exam Updated Vital Signs BP 133/84 (BP Location: Left Arm)   Pulse 94   Temp 98.5 F (36.9 C) (Oral)  Resp 16   Ht 5\' 3"  (1.6 m)   Wt 104.3 kg   SpO2 100%   BMI 40.74 kg/m  Physical Exam Constitutional:      General: She is not in acute distress. HENT:     Head: Normocephalic and atraumatic.  Eyes:     Conjunctiva/sclera: Conjunctivae normal.     Pupils: Pupils are equal, round, and reactive to light.  Neck:     Comments: Oropharynx non-erythematous.  No tonsillar swelling or exudate.  No uvular deviation.  No drooling. No brawny edema. No stridor. Voice is not muffled.   Cardiovascular:     Rate and Rhythm: Normal rate and regular rhythm.  Pulmonary:     Effort: Pulmonary effort is normal. No respiratory distress.  Abdominal:     General: There is no distension.     Tenderness: There is no abdominal tenderness.  Skin:     General: Skin is warm and dry.     Comments: Nonblanching erythematous pruritic rash involving the extremities, dorsal surface of the hands, sparing the palms, also of the chest and back, and face.  No lesions noted on the tongue or the mouth.  No lesions noted on the soles of the feet.  Neurological:     General: No focal deficit present.     Mental Status: She is alert. Mental status is at baseline.  Psychiatric:        Mood and Affect: Mood normal.        Behavior: Behavior normal.     ED Results / Procedures / Treatments   Labs (all labs ordered are listed, but only abnormal results are displayed) Labs Reviewed - No data to display  EKG None  Radiology No results found.  Procedures Procedures    Medications Ordered in ED Medications  predniSONE (DELTASONE) tablet 60 mg (has no administration in time range)  hydrOXYzine (ATARAX) tablet 25 mg (has no administration in time range)    ED Course/ Medical Decision Making/ A&P                             Medical Decision Making Risk Prescription drug management.   Patient is presenting with a diffuse pruritic rash involving most of her body, rapidly progressing, consistent with a contact dermatitis or other atopic reaction.  We will start her on steroids, systemically given the widespread involvement.  I recommended she also continue taking Benadryl, which she tried last night, in addition to Atarax for the itching.  I do not see evidence of airway compromise.  I doubt anaphylaxis.  She does not have hives on exam.  I do not see lesions on the hands feet or mouth to suggest hand-foot-and-mouth disease.  The patient does not have SIRS criteria and otherwise is clinically well-appearing, have a low suspicion for cellulitis or sepsis with this presentation.  No sore throat, doubt strep as a cause.  There is no indication for antibiotics at this time.  Return precautions were discussed with the patient.  She verbalized  understanding and agreement with the plan        Final Clinical Impression(s) / ED Diagnoses Final diagnoses:  Rash    Rx / DC Orders ED Discharge Orders          Ordered    methylPREDNISolone (MEDROL DOSEPAK) 4 MG TBPK tablet        09/10/22 0942    hydrOXYzine (ATARAX) 25 MG tablet  Every  8 hours PRN        09/10/22 0942              Terald Sleeper, MD 09/10/22 [phone]

## 2022-09-10 NOTE — Discharge Instructions (Addendum)
You can continue taking over-the-counter Benadryl, 25 mg every 8 hours as needed for itching, in addition to the Atarax that was prescribed.    Your next dose of methylprednisone steroid will be due tomorrow morning with breakfast.  Please schedule follow-up appointment in your doctors office early next week to have your rash reexamined.  If your symptoms are getting worse, specifically begin feeling lightheaded, have swelling in your throat or difficulty breathing, fevers at home, or any other concerning medical symptoms, please return to the ER.

## 2022-09-10 NOTE — ED Triage Notes (Signed)
Generalized rash since yesterday.  No known allergen exposure.  Pt states she has some itching.  No new medications or household solutions.

## 2022-10-03 ENCOUNTER — Other Ambulatory Visit (HOSPITAL_COMMUNITY): Payer: Self-pay | Admitting: Psychiatry

## 2022-10-03 DIAGNOSIS — F411 Generalized anxiety disorder: Secondary | ICD-10-CM

## 2022-10-03 DIAGNOSIS — F53 Postpartum depression: Secondary | ICD-10-CM

## 2022-10-12 ENCOUNTER — Ambulatory Visit (INDEPENDENT_AMBULATORY_CARE_PROVIDER_SITE_OTHER): Payer: Medicaid Other | Admitting: Licensed Clinical Social Worker

## 2022-10-12 DIAGNOSIS — F331 Major depressive disorder, recurrent, moderate: Secondary | ICD-10-CM | POA: Diagnosis not present

## 2022-10-12 DIAGNOSIS — F411 Generalized anxiety disorder: Secondary | ICD-10-CM | POA: Diagnosis not present

## 2022-10-12 NOTE — Progress Notes (Signed)
Virtual Visit via Video Note  I connected with Carol Wright on 10/12/22 at  9:00 AM EDT by a video enabled telemedicine application and verified that I am speaking with the correct person using two identifiers.  Location: Patient: Stationary car Provider: office   I discussed the limitations of evaluation and management by telemedicine and the availability of in person appointments. The patient expressed understanding and agreed to proceed.   I discussed the assessment and treatment plan with the patient. The patient was provided an opportunity to ask questions and all were answered. The patient agreed with the plan and demonstrated an understanding of the instructions.   The patient was advised to call back or seek an in-person evaluation if the symptoms worsen or if the condition fails to improve as anticipated.  I provided 43 minutes of non-face-to-face time during this encounter.  THERAPIST PROGRESS NOTE  Session Time: 9:00 AM to 9:43 AM  Participation Level: Active  Behavioral Response: CasualAlertDepressed  Type of Therapy: Individual Therapy  Treatment Goals addressed: decrease depression, decrease anxiety increase self-esteem, coping  ProgressTowards Goals: Progressing-patient utilized reframing to help patient with negative thought patterns assess progress and because treatment interventions were applied therapist validating and how patient feels at the same time therapy on hold limited progress   Interventions: Solution Focused, Strength-based, Supportive, and Reframing  Summary: Carol Wright is a 37 y.o. female who presents with they are with grandparents until they find something bigger for everyone to go to. It has been a struggle. Issues with boundaries. Husband is strong willed and opinionated feels there is a right way of doing things. He has been through it before with family member with dementia. Husband thinks not handling the situation the right way with  grandfather not give him so much freedom, he needs boundaries just for safety. Grandmother doesn't agree rough spot for patient doesn't want either to be upset. Grandmother passive aggressive comment remember it is her husband's house so hard for patient. Therapist explored what is the right call in handling situation with patient.  Patient feels like a rock and hard place doesn't want them to be upset and end up being upset at patient. Patient feels stuck feeling crummy because of situation in. Blame herself was patient's decision. Mom wanted freedom bickering with grandmother. Entire life think about other's feelings and well-being before her own. When considered selling their place though If sell house money to buy a bigger place everyone will be happier win/win for everybody. But for patient doesn't feel happier tense everyday something happen everyday between grandparents and husband. Waking on eggshells while mom enjoying herself. Patient feels unhappy and feels nobody cares.  Therapist noted other considerations besides people's happiness patient making what seemed best decision at that time also was a factor in moving in with grandparents but clearly not working out and needs to do something different.  Patient goes on to explain her history watched younger sister do things for mom intermediary at age of 6 things she didn't want to do patient had to mom said raising her patient says manipulative to do to a child. Grandmother do the same thing being manipulative. Older wanted to go away can't leave me with sister by myself she is young and mom working. Plan not to get married, husband sick marry him to get insurance. Married in her mind temporary for him life stuck in it, his plan was to have kids he told her her fault not have discussion about it. Feels angry  but at herself.  Argue everyday with husband about grandparents that she goes against his opinion feels don't care anymore. Asks why with this  person out of convenience. Once that changes don't want him around anymore. Horrible position where he goes out of his way to help people tired and doesn't want to do this anymore.  Hope that things get better. Hoped that this situation hasn't damaged things permanently.   Patient feels hamster wheel again taking care of things and not being happy. Feels like a wasted life. Her Mom says has kids patient says don't regret them now but in the moment was it what she wanted not necessarily wasted a life.  Therapist explored how can she make herself happy? Live like this for so long doesn't know.  Reviewed session any insights patient said not look in front in moment instead  what is going happen ahead lose sight of that and think about plans for the future. Therapist asked what are some small things she can do? Patient says tried and no time. At night exhausted and drained by the day.         Therapist provided perspective by looking at situation she is in and why she may feel like she does.  Lives with grandparents and grandparents do not allow any freedom it has to be their way can understand causing patient to feel stuck.  Also the different players in the situation seem to force their opinion without any reflection on if it is helpful how it impacts patient.  Therapist validated patient on feeling stuck in in the middle.  Patient reflective her life that she has made decisions to make other people happy not herself feel she is doing the same thing.  Therapist looked more in detail why she moved in with grandparents noted not just a decision to make them happy but also would seem to make sense at the time taking care of grandparents would be helpful to be closer but that decision has not worked out.  Therapist does see patient having to take responsibility being put on her in this way having to continue patterns.  Therapist guided patient in breaking pattern by looking at work would make her happy also  encouraging patient giving herself time taking a longer perspective for some of these things to happen.  She will find time for herself as her kids get older also continuing plan of moving where she would have more freedom.  It may not be in the context he thought for self but still will be able to have some of the things she wants.  Noted patients in his situation were boundaries or an issue difficult to assert when grandmother is domineering and does not allow people space has to be her way.  Also difficult with people involved not being aware of other people's positions asserting their own makes the situation more difficult.  Therapist provided space and support for patient to talk about thoughts and feelings in session. Suicidal/Homicidal: No  Plan: 1.therapy on hold for now after review with patient. Therapist encouraged patient to take a big picture frame to see life developments that will bring her some of the things she is looking for, look at current circumstances begin to make changes that we will move her in a more positive direction  Diagnosis: Major depressive disorder, moderate, recurrent, generalized anxiety disorder  Collaboration of Care: Other none needed  Patient/Guardian was advised Release of Information must be obtained prior to any record  release in order to collaborate their care with an outside provider. Patient/Guardian was advised if they have not already done so to contact the registration department to sign all necessary forms in order for [place] to release information regarding their care.   Consent: Patient/Guardian gives verbal consent for treatment and assignment of benefits for services provided during this visit. Patient/Guardian expressed understanding and agreed to proceed.   Coolidge Breeze, LCSW 10/12/2022

## 2022-11-09 ENCOUNTER — Telehealth (INDEPENDENT_AMBULATORY_CARE_PROVIDER_SITE_OTHER): Payer: Medicaid Other | Admitting: Psychiatry

## 2022-11-09 ENCOUNTER — Encounter (HOSPITAL_COMMUNITY): Payer: Self-pay | Admitting: Psychiatry

## 2022-11-09 DIAGNOSIS — F331 Major depressive disorder, recurrent, moderate: Secondary | ICD-10-CM | POA: Diagnosis not present

## 2022-11-09 DIAGNOSIS — F411 Generalized anxiety disorder: Secondary | ICD-10-CM

## 2022-11-09 DIAGNOSIS — F53 Postpartum depression: Secondary | ICD-10-CM | POA: Diagnosis not present

## 2022-11-09 DIAGNOSIS — G4700 Insomnia, unspecified: Secondary | ICD-10-CM | POA: Diagnosis not present

## 2022-11-09 MED ORDER — BUPROPION HCL ER (XL) 300 MG PO TB24: 300.0000 mg | ORAL_TABLET | Freq: Every day | ORAL | 0 refills | Status: DC

## 2022-11-09 MED ORDER — SERTRALINE HCL 100 MG PO TABS: 100.0000 mg | ORAL_TABLET | Freq: Every day | ORAL | 0 refills | Status: DC

## 2022-11-09 NOTE — Progress Notes (Signed)
[facility] Follow up visit  Patient Identification: Carol Wright MRN:  [id number] Date of Evaluation:  11/09/2022 Referral Source: Carol Wright Counsellor Chief Complaint:   No chief complaint on file. Follow up depression, post partum Visit Diagnosis:    ICD-10-CM   1. Generalized anxiety disorder  F41.1 buPROPion (WELLBUTRIN XL) 300 MG 24 hr tablet    sertraline (ZOLOFT) 100 MG tablet    2. Moderate episode of recurrent major depressive disorder (HCC)  F33.1 buPROPion (WELLBUTRIN XL) 300 MG 24 hr tablet    3. Post-partum depression  F53.0 sertraline (ZOLOFT) 100 MG tablet    Virtual Visit via Video Note  I connected with Carol Wright on 11/09/22 at 10:00 AM EDT by a video enabled telemedicine application and verified that I am speaking with the correct person using two identifiers.  Location: Patient: home Provider: home office   I discussed the limitations of evaluation and management by telemedicine and the availability of in person appointments. The patient expressed understanding and agreed to proceed.      I discussed the assessment and treatment plan with the patient. The patient was provided an opportunity to ask questions and all were answered. The patient agreed with the plan and demonstrated an understanding of the instructions.   The patient was advised to call back or seek an in-person evaluation if the symptoms worsen or if the condition fails to improve as anticipated.  I provided 20 minutes of non-face-to-face time during this encounter.      History of Present Illness: Patient is a 37 years old currently married female she has 3 kids youngest is 18 years of age she is a homemaker.  Referred initially by primary care physician on-call start to establish for her depression and postpartum possible depression  Carol Wright is 37 years old, handling stress and depression manageable, sleep can still if at times difficult with or without trazadone    Aggravating factors:  taking care of grand parents, mom doesn't take much responsibility, grand pa has dementia  Modifying factor: marriage, kids Duration adult life Severity  manageable Hospital admission denies Drug use denies   Past Psychiatric History: depression  Previous Psychotropic Medications: Yes   Substance Abuse History in the last 12 months:  No.  Consequences of Substance Abuse: NA  Past Medical History:  Past Medical History:  Diagnosis Date   Depression    Generalized anxiety disorder    H/O arterial dissection    Hepatic steatosis    Via ultrasound.     History of pre-eclampsia    Hyperlipidemia    Myocardial infarction (HCC) 2013   Obesity (BMI 30-39.9)    PCOS (polycystic ovarian syndrome)    Pre-eclampsia    Spontaneous dissection of coronary artery    a. occurred in 2013 during pregnany. Underwent placement of BMS per the patient's report.     Past Surgical History:  Procedure Laterality Date   Arterial disection  03/17/2011   Bare metal stent  03/17/2011   CESAREAN SECTION  03/17/2011   CESAREAN SECTION N/A 10/07/2020   Procedure: CESAREAN SECTION;  Surgeon: Carol Maples, MD;  Location: [facility];  Service: Obstetrics;  Laterality: N/A;   cholcyst     CHOLECYSTECTOMY      Family Psychiatric History: sister: depression  Family History:  Family History  Problem Relation Age of Onset   Hypertension Mother    Hypertension Maternal Grandmother    Heart attack Maternal Grandfather     Social History:   Social History  Socioeconomic History   Marital status: Married    Spouse name: Not on file   Number of children: 1   Years of education: Not on file   Highest education level: Not on file  Occupational History    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  Other Topics Concern   Not on file  Social History Narrative   Not on file   Social Determinants of Health   Financial Resource Strain: Low Risk  (05/17/2022)   Received from [facility]   Overall Financial Resource Strain (CARDIA)    Difficulty of Paying Living Expenses: Not hard at all  Food Insecurity: No Food Insecurity (05/17/2022)   [facility]   Hunger Vital Sign    Worried About Running Out of Food in the Last Year: Never true    Ran Out of Food in the Last Year: Never true  Transportation Needs: No Transportation Needs (05/17/2022)   Received from [facility]   PRAPARE - Transportation    Lack of Transportation (Medical): No    Lack of Transportation (Non-Medical): No  Physical Activity: Inactive (05/17/2022)   Received from [facility]   Exercise Vital Sign    Days of Exercise per Week: 0 days    Minutes of Exercise per Session: 10 min  Stress: Stress Concern Present (05/17/2022)   Received from [facility] of Occupational Health - Occupational Stress Questionnaire    Feeling of Stress : To some extent  Social Connections: Moderately Integrated (05/17/2022)   Received from [facility]   Social Network    How would you rate your social network (family, work, friends)?: Adequate participation with social networks     Allergies:   Allergies  Allergen Reactions   Ace Inhibitors     cough    Metabolic Disorder Labs: Lab Results  Component Value Date   HGBA1C 4.5 03/25/2020   MPG 82 03/25/2020   MPG 82 11/27/2019   No results found for: "PROLACTIN" Lab Results  Component Value Date   CHOL 154 12/01/2021   TRIG 89 12/01/2021   HDL 41 (L) 12/01/2021   CHOLHDL 3.8 12/01/2021   VLDL 32 06/20/2013   LDLCALC 95 12/01/2021   LDLCALC 128 (H) 11/11/2020   Lab Results  Component Value Date   TSH 2.14  12/01/2021    Therapeutic Level Labs: No results found for: "LITHIUM" No results found for: "CBMZ" No results found for: "VALPROATE"  Current Medications: Current Outpatient Medications  Medication Sig Dispense Refill   amLODipine (NORVASC) 5 MG tablet Take 5 mg by mouth daily.     aspirin 81 MG chewable tablet Chew by mouth daily.     buPROPion (WELLBUTRIN XL) 300 MG 24 hr tablet Take 1 tablet (300 mg total) by mouth daily. 90 tablet 0   cetirizine (ZYRTEC) 10 MG tablet Take 10 mg by mouth daily.     chlorthalidone (HYGROTON) 25 MG tablet Take 25 mg by mouth daily.     hydrOXYzine (ATARAX) 25 MG tablet Take 1 tablet (25 mg total) by mouth every 8 (eight) hours as needed for up to 21 doses. 21  tablet 0   Insulin Pen Needle 31G X 6 MM MISC To use with saxenda pen. 60 each 0   levonorgestrel (LILETTA, 52 MG,) 20.1 MCG/DAY IUD 1 each by Intrauterine route once.     Liraglutide -Weight Management (SAXENDA) 18 MG/3ML SOPN Inject 3 mg into the skin daily. 0.6 mg inj subcut daily for 1 week, then incr by 0.6 mg weekly until reaching 3 mg injected subcut daily 15 mL 0   metFORMIN (GLUCOPHAGE) 500 MG tablet Take 1 tablet (500 mg total) by mouth 2 (two) times daily with a meal. 180 tablet 1   methylPREDNISolone (MEDROL DOSEPAK) 4 MG TBPK tablet Take as directed on package 21 tablet 0   sertraline (ZOLOFT) 100 MG tablet Take 1 tablet (100 mg total) by mouth daily. 90 tablet 0   traZODone (DESYREL) 50 MG tablet Take 0.5-1 tablets (25-50 mg total) by mouth at bedtime as needed for sleep. 30 tablet 2   WEGOVY 0.25 MG/0.5ML SOAJ Inject 0.25 mg into the skin once a week. Use this dose for 1 month (4 shots) and then increase to next higher dose. (Patient not taking: Reported on 12/01/2021) 2 mL 0   WEGOVY 0.5 MG/0.5ML SOAJ Inject 0.5 mg into the skin once a week. Use this dose for 1 month (4 shots) and then increase to next higher dose. (Patient not taking: Reported on 12/01/2021) 2 mL 1   No current  facility-administered medications for this visit.     Psychiatric Specialty Exam: Review of Systems  Cardiovascular:  Negative for chest pain.  Neurological:  Negative for tremors.  Psychiatric/Behavioral:  Negative for agitation.     unknown if currently breastfeeding.There is no height or weight on file to calculate BMI.  General Appearance: Casual  Eye Contact:  Fair  Speech:  Normal Rate  Volume:  Decreased  Mood:  fair  Affect:  Constricted  Thought Process:  Goal Directed  Orientation:  Full (Time, Place, and Person)  Thought Content:  Rumination  Suicidal Thoughts:  No  Homicidal Thoughts:  No  Memory:  Immediate;   Fair  Judgement:  Fair  Insight:  Fair  Psychomotor Activity:  Decreased  Concentration:  Concentration: Fair  Recall:  Jennelle Human of Knowledge:Fair  Language: Fair  Akathisia:  No  Handed:    AIMS (if indicated):  not done  Assets:  Financial Resources/Insurance Housing Physical Health  ADL's:  Intact  Cognition: WNL  Sleep:   irregular at times   Screenings: GAD-7    Flowsheet Row Office Visit from 12/01/2021 in [facility] Primary Care & Sports Medicine at [facility] Office Visit from 05/19/2021 in [facility] Primary Care & Sports Medicine at [facility] Office Visit from 11/11/2020 in [facility] Primary Care & Sports Medicine at [facility] Clinical Support from 09/30/2020 in Center [facility] at [facility] Clinical Support from 09/12/2020 in Center for [employer] [facility] Healthcare at [facility]  Total GAD-7 Score 6 12 13  0 0      PHQ2-9    Flowsheet Row Office Visit from 12/01/2021 in [facility] Primary Care & Sports Medicine at [facility] Video Visit from 10/13/2021 in [facility] Outpatient Behavioral [facility] at [facility] Counselor from 07/09/2021 in [facility]patient [facility] at [facility] Office Visit from 05/19/2021 in  [facility] Primary Care & Sports Medicine at [facility] Office Visit from 11/11/2020 in [facility] Primary Care & Sports Medicine at [facility]  PHQ-2 Total Score 3 2 3 2 3   PHQ-9 Total Score 13 10 18 9 16       Flowsheet Row ED from 09/10/2022 in [facility] Emergency Department at [facility] Video Visit from 01/12/2022 in [facility] Outpatient Behavioral Health at [facility] Video Visit from 10/13/2021 in [facility] Outpatient Behavior[facility] at [facility]  C-SSRS RISK CATEGORY No Risk No Risk No Risk       Assessment and Plan: as follows  Prior documentation reviewed  MDD recurrent moderate: doing better continue zoloft, wellbutrin  GAD: manageable continue zoloft,   Insomnia: fair , continue sleep hygiene and trazadone if needed  Refills if due were sent  Fu 3m.    Collaboration of Care: Other primary care and counsellor notes and meds reviewed  Patient/Guardian was advised Release of Information must be obtained prior to any record release in order to collaborate their care with an outside provider. Patient/Guardian was advised if they have not already done so to contact the registration department to sign all necessary forms in order for [place] to release information regarding their care.   Consent: Patient/Guardian gives verbal consent for treatment and assignment of benefits for services provided during this visit. Patient/Guardian expressed understanding and agreed to proceed.   Thresa Ross, MD 8/26/202410:14 AM

## 2023-02-08 ENCOUNTER — Other Ambulatory Visit (HOSPITAL_COMMUNITY): Payer: Self-pay | Admitting: Psychiatry

## 2023-02-08 DIAGNOSIS — F411 Generalized anxiety disorder: Secondary | ICD-10-CM

## 2023-02-08 DIAGNOSIS — F331 Major depressive disorder, recurrent, moderate: Secondary | ICD-10-CM

## 2023-02-15 ENCOUNTER — Encounter (HOSPITAL_COMMUNITY): Payer: Self-pay | Admitting: Psychiatry

## 2023-02-15 ENCOUNTER — Telehealth (INDEPENDENT_AMBULATORY_CARE_PROVIDER_SITE_OTHER): Payer: Medicaid Other | Admitting: Psychiatry

## 2023-02-15 DIAGNOSIS — F411 Generalized anxiety disorder: Secondary | ICD-10-CM

## 2023-02-15 DIAGNOSIS — F331 Major depressive disorder, recurrent, moderate: Secondary | ICD-10-CM

## 2023-02-15 DIAGNOSIS — F53 Postpartum depression: Secondary | ICD-10-CM

## 2023-02-15 DIAGNOSIS — G479 Sleep disorder, unspecified: Secondary | ICD-10-CM

## 2023-02-15 MED ORDER — SERTRALINE HCL 100 MG PO TABS: 100.0000 mg | ORAL_TABLET | Freq: Every day | ORAL | 0 refills | Status: DC

## 2023-02-15 MED ORDER — TRAZODONE HCL 50 MG PO TABS
25.0000 mg | ORAL_TABLET | Freq: Every evening | ORAL | 2 refills | Status: DC | PRN
Start: 2023-02-15 — End: 2023-06-21

## 2023-02-15 NOTE — Progress Notes (Signed)
[facility] Follow up visit  Patient Identification: Carol Wright MRN:  [id number] Date of Evaluation:  02/15/2023 Referral Source: Corrie Dandy Counsellor Chief Complaint:   No chief complaint on file. Follow up depression, post partum Visit Diagnosis:    ICD-10-CM   1. Generalized anxiety disorder  F41.1 sertraline (ZOLOFT) 100 MG tablet    2. Moderate episode of recurrent major depressive disorder (HCC)  F33.1     3. Post-partum depression  F53.0 sertraline (ZOLOFT) 100 MG tablet    4. Major depressive disorder, recurrent episode, moderate (HCC)  F33.1     5. Trouble in sleeping  G47.9 traZODone (DESYREL) 50 MG tablet    Virtual Visit via Video Note  I connected with Carol Wright on 02/15/23 at 10:00 AM EST by a video enabled telemedicine application and verified that I am speaking with the correct person using two identifiers.  Location: Patient: home Provider: home office   I discussed the limitations of evaluation and management by telemedicine and the availability of in person appointments. The patient expressed understanding and agreed to proceed.      I discussed the assessment and treatment plan with the patient. The patient was provided an opportunity to ask questions and all were answered. The patient agreed with the plan and demonstrated an understanding of the instructions.   The patient was advised to call back or seek an in-person evaluation if the symptoms worsen or if the condition fails to improve as anticipated.  I provided 18 minutes of non-face-to-face time during this encounter.    History of Present Illness: Patient is a 37 years old currently married female she has 3 kids youngest is 39 years of age she is a homemaker.  Referred initially by primary care physician on-call start to establish for her depression and postpartum possible depression  Carol Wright is 37 years old, She has moved in with grand parents and taking care of them as well  Overall doing  fair,sleep fair, keeps busy with kids and taking care of GP     Aggravating factors: taking care of grand parents, mom doesn't take much responsibility, grand pa has dementia  Modifying factor: marriage,kids Duration adult life Severity better or manageable Hospital admission denies Drug use denies   Past Psychiatric History: depression  Previous Psychotropic Medications: Yes   Substance Abuse History in the last 12 months:  No.  Consequences of Substance Abuse: NA  Past Medical History:  Past Medical History:  Diagnosis Date   Depression    Generalized anxiety disorder    H/O arterial dissection    Hepatic steatosis    Via ultrasound.     History of pre-eclampsia    Hyperlipidemia    Myocardial infarction (HCC) 2013   Obesity (BMI 30-39.9)    PCOS (polycystic ovarian syndrome)    Pre-eclampsia    Spontaneous dissection of coronary artery    a. occurred in 2013 during pregnany. Underwent placement of BMS per the patient's report.     Past Surgical History:  Procedure Laterality Date   Arterial disection  03/17/2011   Bare metal stent  03/17/2011   CESAREAN SECTION  03/17/2011   CESAREAN SECTION N/A 10/07/2020   Procedure: CESAREAN SECTION;  Surgeon: Venora Maples, MD;  Location: [facility] OR;  Service: Obstetrics;  Laterality: N/A;   cholcyst     CHOLECYSTECTOMY      Family Psychiatric History: sister: depression  Family History:  Family History  Problem Relation Age of Onset   Hypertension Mother  Hypertension Maternal Grandmother    Heart attack Maternal Grandfather     Social History:   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  Other Topics Concern   Not on file  Social History Narrative   Not on file   Social Determinants of Health   Financial Resource Strain: Low Risk  (05/17/2022)   [facility]   Overall Financial Resource Strain (CARDIA)    Difficulty of Paying Living Expenses: Not hard at all  Food Insecurity: No Food Insecurity (05/17/2022)   [facility]   Hunger Vital Sign    Worried About Running Out of Food in the Last Year: Never true    Ran Out of Food in the Last Year: Never true  Transportation Needs: No Transportation Needs (05/17/2022)   [facility]   PRAPARE - Transportation    Lack of Transportation (Medical): No    Lack of Transportation (Non-Medical): No  Physical Activity: Unknown (05/17/2022)   Received from [facility]   Exercise Vital Sign    Days of Exercise per Week: 0 days    Minutes of Exercise per Session: Not on file  Recent Concern: Physical Activity - Inactive (05/17/2022)   [facility]   Exercise Vital Sign    Days of Exercise per Week: 0 days    Minutes of Exercise per Session: 10 min  Stress: Stress Concern Present (05/17/2022)   Received from [facility] of Occupational Health - Occupational Stress Questionnaire    Feeling of Stress : To some extent  Social Connections: Moderately Integrated (05/17/2022)   [facility]   Social Network    How would you rate your social network (family, work, friends)?: Adequate participation with social networks     Allergies:   Allergies  Allergen Reactions   Ace Inhibitors     cough    Metabolic Disorder Labs: Lab Results  Component Value Date   HGBA1C 4.5 03/25/2020   MPG 82 03/25/2020   MPG 82 11/27/2019   No results  found for: "PROLACTIN" Lab Results  Component Value Date   CHOL 154 12/01/2021   TRIG 89 12/01/2021   HDL 41 (L) 12/01/2021   CHOLHDL 3.8 12/01/2021   VLDL 32 06/20/2013   LDLCALC 95 12/01/2021   LDLCALC 128 (H) 11/11/2020   Lab Results  Component Value Date   TSH 2.14 12/01/2021    Therapeutic Level Labs: No results found for: "LITHIUM" No results found for: "CBMZ" No results found for: "VALPROATE"  Current Medications: Current Outpatient Medications  Medication Sig Dispense Refill   amLODipine (NORVASC) 5 MG tablet Take 5 mg by mouth daily.     aspirin 81 MG chewable tablet Chew by mouth daily.     buPROPion (WELLBUTRIN XL) 300 MG 24 hr tablet Take 1 tablet by mouth once daily  90 tablet 0   cetirizine (ZYRTEC) 10 MG tablet Take 10 mg by mouth daily.     chlorthalidone (HYGROTON) 25 MG tablet Take 25 mg by mouth daily.     hydrOXYzine (ATARAX) 25 MG tablet Take 1 tablet (25 mg total) by mouth every 8 (eight) hours as needed for up to 21 doses. 21 tablet 0   Insulin Pen Needle 31G X 6 MM MISC To use with saxenda pen. 60 each 0   levonorgestrel (LILETTA, 52 MG,) 20.1 MCG/DAY IUD 1 each by Intrauterine route once.     Liraglutide -Weight Management (SAXENDA) 18 MG/3ML SOPN Inject 3 mg into the skin daily. 0.6 mg inj subcut daily for 1 week, then incr by 0.6 mg weekly until reaching 3 mg injected subcut daily 15 mL 0   metFORMIN (GLUCOPHAGE) 500 MG tablet Take 1 tablet (500 mg total) by mouth 2 (two) times daily with a meal. 180 tablet 1   methylPREDNISolone (MEDROL DOSEPAK) 4 MG TBPK tablet Take as directed on package 21 tablet 0   sertraline (ZOLOFT) 100 MG tablet Take 1 tablet (100 mg total) by mouth daily. 90 tablet 0   traZODone (DESYREL) 50 MG tablet Take 0.5-1 tablets (25-50 mg total) by mouth at bedtime as needed for sleep. 30 tablet 2   WEGOVY 0.25 MG/0.5ML SOAJ Inject 0.25 mg into the skin once a week. Use this dose for 1 month (4 shots) and then increase to next higher  dose. (Patient not taking: Reported on 12/01/2021) 2 mL 0   WEGOVY 0.5 MG/0.5ML SOAJ Inject 0.5 mg into the skin once a week. Use this dose for 1 month (4 shots) and then increase to next higher dose. (Patient not taking: Reported on 12/01/2021) 2 mL 1   No current facility-administered medications for this visit.     Psychiatric Specialty Exam: Review of Systems  Cardiovascular:  Negative for chest pain.  Neurological:  Negative for tremors.  Psychiatric/Behavioral:  Negative for agitation.     unknown if currently breastfeeding.There is no height or weight on file to calculate BMI.  General Appearance: Casual  Eye Contact:  Fair  Speech:  Normal Rate  Volume:  Decreased  Mood:  fair  Affect:  Constricted  Thought Process:  Goal Directed  Orientation:  Full (Time, Place, and Person)  Thought Content:  Rumination  Suicidal Thoughts:  No  Homicidal Thoughts:  No  Memory:  Immediate;   Fair  Judgement:  Fair  Insight:  Fair  Psychomotor Activity:  Decreased  Concentration:  Concentration: Fair  Recall:  Jennelle Human of Knowledge:Fair  Language: Fair  Akathisia:  No  Handed:    AIMS (if indicated):  not done  Assets:  Financial Resources/Insurance Housing Physical Health  ADL's:  Intact  Cognition: WNL  Sleep:   irregular at times   Screenings: GAD-7    Flowsheet Row Office Visit from 12/01/2021 in [facility] Primary Care & Sports Medicine at [facility] Office Visit from 05/19/2021 in [facility] Primary Care & Sports Medicine at [facility] Office Visit from 11/11/2020 in [facility] Primary Care & Sports Medicine at [facility] Clinical Support from 09/30/2020 in Center for [facility] Healthcare at [facility] Clinical Support from 09/12/2020 in Center for [employer] [facility] at [facility]  Total GAD-7 Score 6 12 13  0 0      PHQ2-9    Flowsheet Row Office Visit from 12/01/2021 in [facility] Primary Care  &  Sports Medicine at [employer] Video Visit from 10/13/2021 in [facility] Outpatient Behavioral Health at [facility] Counselor from 07/09/2021 in [facility] Outpatient Behavioral Health at [facility] Office Visit from 05/19/2021 in [facility] Primary Care & Sports Medicine at [facility] Office Visit from 11/11/2020 in [facility] Primary Care & Sports Medicine at [facility]  PHQ-2 Total Score 3 2 3 2 3   PHQ-9 Total Score 13 10 18 9 16       Flowsheet Row ED from 09/10/2022 in [facility] Emergency Department at [facility] Video Visit from 01/12/2022 in [facility] Outpatient Behavioral Health at [facility] Video Visit from 10/13/2021 in [facility] Outpatient Behavioral Health at [facility]  C-SSRS RISK CATEGORY No Risk No Risk No Risk       Assessment and Plan: as follows  Prior documentation reviewed   MDD recurrent moderate: doing manageable or fair, continue zoloft, wellbutrin  GAD: manageable conitnue zoloft  Insomnia: reviewed sleep hygiene, trazadone helps, will continue    Fu  4 m.    Collaboration of Care: Other primary care and counsellor notes and meds reviewed  Patient/Guardian was advised Release of Information must be obtained prior to any record release in order to collaborate their care with an outside provider. Patient/Guardian was advised if they have not already done so to contact the registration department to sign all necessary forms in order for [place] to release information regarding their care.   Consent: Patient/Guardian gives verbal consent for treatment and assignment of benefits for services provided during this visit. Patient/Guardian expressed understanding and agreed to proceed.   Thresa Ross, MD 12/2/202410:09 AM

## 2023-05-08 ENCOUNTER — Other Ambulatory Visit (HOSPITAL_COMMUNITY): Payer: Self-pay | Admitting: Psychiatry

## 2023-05-08 DIAGNOSIS — F331 Major depressive disorder, recurrent, moderate: Secondary | ICD-10-CM

## 2023-05-08 DIAGNOSIS — F411 Generalized anxiety disorder: Secondary | ICD-10-CM

## 2023-06-21 ENCOUNTER — Telehealth (INDEPENDENT_AMBULATORY_CARE_PROVIDER_SITE_OTHER): Payer: Medicaid Other | Admitting: Psychiatry

## 2023-06-21 ENCOUNTER — Encounter (HOSPITAL_COMMUNITY): Payer: Self-pay | Admitting: Psychiatry

## 2023-06-21 DIAGNOSIS — F331 Major depressive disorder, recurrent, moderate: Secondary | ICD-10-CM

## 2023-06-21 DIAGNOSIS — F411 Generalized anxiety disorder: Secondary | ICD-10-CM

## 2023-06-21 DIAGNOSIS — F53 Postpartum depression: Secondary | ICD-10-CM

## 2023-06-21 DIAGNOSIS — F5102 Adjustment insomnia: Secondary | ICD-10-CM

## 2023-06-21 DIAGNOSIS — G479 Sleep disorder, unspecified: Secondary | ICD-10-CM

## 2023-06-21 MED ORDER — SERTRALINE HCL 100 MG PO TABS
100.0000 mg | ORAL_TABLET | Freq: Every day | ORAL | 0 refills | Status: AC
Start: 2023-06-21 — End: ?

## 2023-06-21 MED ORDER — TRAZODONE HCL 50 MG PO TABS
25.0000 mg | ORAL_TABLET | Freq: Every evening | ORAL | 2 refills | Status: AC | PRN
Start: 2023-06-21 — End: ?

## 2023-06-21 MED ORDER — BUPROPION HCL ER (XL) 300 MG PO TB24
300.0000 mg | ORAL_TABLET | Freq: Every day | ORAL | 0 refills | Status: AC
Start: 2023-06-21 — End: ?

## 2023-06-21 NOTE — Progress Notes (Signed)
 [facility] Follow up visit  Patient Identification: Carol Wright MRN:  [id number] Date of Evaluation:  06/21/2023 Referral Source: Carol Wright Counsellor Chief Complaint:   No chief complaint on file. Follow up depression, post partum Visit Diagnosis:    ICD-10-CM   1. Generalized anxiety disorder  F41.1 buPROPion (WELLBUTRIN XL) 300 MG 24 hr tablet    sertraline (ZOLOFT) 100 MG tablet    2. Moderate episode of recurrent major depressive disorder (HCC)  F33.1 buPROPion (WELLBUTRIN XL) 300 MG 24 hr tablet    3. Adjustment insomnia  F51.02     4. Post-partum depression  F53.0 sertraline (ZOLOFT) 100 MG tablet    5. Trouble in sleeping  G47.9 traZODone (DESYREL) 50 MG tablet    Virtual Visit via Video Note  I connected with Carol Wright on 06/21/23 at  9:00 AM EDT by a video enabled telemedicine application and verified that I am speaking with the correct person using two identifiers.  Location: Patient: home Provider: home office   I discussed the limitations of evaluation and management by telemedicine and the availability of in person appointments. The patient expressed understanding and agreed to proceed.     I discussed the assessment and treatment plan with the patient. The patient was provided an opportunity to ask questions and all were answered. The patient agreed with the plan and demonstrated an understanding of the instructions.   The patient was advised to call back or seek an in-person evaluation if the symptoms worsen or if the condition fails to improve as anticipated.  I provided 18 minutes of non-face-to-face time during this encounter.    History of Present Illness: Patient is a 38 years old currently married female she has 3 kids youngest is 53 years of age she is a homemaker.  Referred initially by primary care physician on-call start to establish for her depression and postpartum possible depression  Carol Wright is 55 and half years old Patient is stable and denies  depression, takes care of grand parents  Sleeps fair on meds. No side effects reported     Aggravating factors: taking care of grand parents, mom doesn't take much responsibility, grand pa has dementia  Modifying factor: marriage, kids Duration adult life Severity improved   Past Psychiatric History: depression  Previous Psychotropic Medications: Yes   Substance Abuse History in the last 12 months:  No.  Consequences of Substance Abuse: NA  Past Medical History:  Past Medical History:  Diagnosis Date   Depression    Generalized anxiety disorder    H/O arterial dissection    Hepatic steatosis    Via ultrasound.     History of pre-eclampsia    Hyperlipidemia    Myocardial infarction (HCC) 2013   Obesity (BMI 30-39.9)    PCOS (polycystic ovarian syndrome)    Pre-eclampsia    Spontaneous dissection of coronary artery    a. occurred in 2013 during pregnany. Underwent placement of BMS per the patient's report.     Past Surgical History:  Procedure Laterality Date   Arterial disection  03/17/2011   Bare metal stent  03/17/2011   CESAREAN SECTION  03/17/2011   CESAREAN SECTION N/A 10/07/2020   Procedure: CESAREAN SECTION;  Surgeon: Carol Maples, MD;  Location: [facility] OR;  Service: Obstetrics;  Laterality: N/A;   cholcyst     CHOLECYSTECTOMY      Family Psychiatric History: sister: depression  Family History:  Family History  Problem Relation Age of Onset   Hypertension Mother  Hypertension Maternal Grandmother    Heart attack Maternal Grandfather     Social History:   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  Other Topics Concern   Not on file  Social History Narrative   Not on file   Social Drivers of Health   Financial Resource Strain: Low Risk  (05/17/2022)   Received from [facility], [facility]   Overall Financial Resource Strain (CARDIA)    Difficulty of Paying Living Expenses: Not hard at all  Food Insecurity: No Food Insecurity (05/17/2022)   Received from [facility], [facility]   Hunger Vital Sign    Worried About Running Out of Food in the Last Year: Never true    Ran Out of Food in the Last Year: Never true  Transportation Needs: No Transportation Needs (05/17/2022)   Received from [facility], [facility]   PRAPARE - Transportation    Lack of Transportation (Medical): No    Lack of Transportation (Non-Medical): No  Physical Activity: Unknown (05/17/2022)   Received from [facility]   Exercise Vital Sign    Days of Exercise per Week: 0 days    Minutes of Exercise per Session: Not on file  Recent Concern: Physical Activity - Inactive (05/17/2022)   Received from [facility], [facility]   Exercise Vital Sign    Days of Exercise per Week: 0 days    Minutes of Exercise per Session: 10 min  Stress: Stress Concern Present (05/17/2022)   Received from [facility], [facility] - Occupational Stress Questionnaire    Feeling of Stress : To some extent  Social Connections: Moderately Integrated (05/17/2022)   Received from [facility], [facility]   Social Network    How would you rate your social network (family, work, friends)?: Adequate participation with social networks     Allergies:   Allergies  Allergen Reactions   Ace Inhibitors     cough    Metabolic Disorder Labs: Lab Results  Component Value Date   HGBA1C 4.5 03/25/2020   MPG 82 03/25/2020   MPG 82 11/27/2019   No results found for: "PROLACTIN" Lab  Results  Component Value Date   CHOL 154 12/01/2021   TRIG 89 12/01/2021   HDL 41 (L) 12/01/2021   CHOLHDL 3.8 12/01/2021   VLDL 32 06/20/2013   LDLCALC 95 12/01/2021   LDLCALC 128 (H) 11/11/2020   Lab Results  Component Value Date   TSH 2.14 12/01/2021    Therapeutic Level Labs: No results found for: "LITHIUM" No results found for: "CBMZ" No results found for: "VALPROATE"  Current Medications: Current Outpatient Medications  Medication Sig Dispense Refill   amLODipine (NORVASC) 5 MG tablet Take 5 mg by mouth daily.     aspirin 81 MG chewable tablet Chew by mouth daily.     buPROPion (WELLBUTRIN XL) 300 MG 24 hr tablet Take 1 tablet (300 mg total) by  mouth daily. 90 tablet 0   cetirizine (ZYRTEC) 10 MG tablet Take 10 mg by mouth daily.     chlorthalidone (HYGROTON) 25 MG tablet Take 25 mg by mouth daily.     hydrOXYzine (ATARAX) 25 MG tablet Take 1 tablet (25 mg total) by mouth every 8 (eight) hours as needed for up to 21 doses. 21 tablet 0   Insulin Pen Needle 31G X 6 MM MISC To use with saxenda pen. 60 each 0   levonorgestrel (LILETTA, 52 MG,) 20.1 MCG/DAY IUD 1 each by Intrauterine route once.     Liraglutide -Weight Management (SAXENDA) 18 MG/3ML SOPN Inject 3 mg into the skin daily. 0.6 mg inj subcut daily for 1 week, then incr by 0.6 mg weekly until reaching 3 mg injected subcut daily 15 mL 0   metFORMIN (GLUCOPHAGE) 500 MG tablet Take 1 tablet (500 mg total) by mouth 2 (two) times daily with a meal. 180 tablet 1   methylPREDNISolone (MEDROL DOSEPAK) 4 MG TBPK tablet Take as directed on package 21 tablet 0   sertraline (ZOLOFT) 100 MG tablet Take 1 tablet (100 mg total) by mouth daily. 90 tablet 0   traZODone (DESYREL) 50 MG tablet Take 0.5-1 tablets (25-50 mg total) by mouth at bedtime as needed for sleep. 30 tablet 2   WEGOVY 0.25 MG/0.5ML SOAJ Inject 0.25 mg into the skin once a week. Use this dose for 1 month (4 shots) and then increase to next higher dose. (Patient not  taking: Reported on 12/01/2021) 2 mL 0   WEGOVY 0.5 MG/0.5ML SOAJ Inject 0.5 mg into the skin once a week. Use this dose for 1 month (4 shots) and then increase to next higher dose. (Patient not taking: Reported on 12/01/2021) 2 mL 1   No current facility-administered medications for this visit.     Psychiatric Specialty Exam: Review of Systems  Cardiovascular:  Negative for chest pain.  Neurological:  Negative for tremors.  Psychiatric/Behavioral:  Negative for agitation.     unknown if currently breastfeeding.There is no height or weight on file to calculate BMI.  General Appearance: Casual  Eye Contact:  Fair  Speech:  Normal Rate  Volume:  Decreased  Mood:  fair  Affect:  Constricted  Thought Process:  Goal Directed  Orientation:  Full (Time, Place, and Person)  Thought Content:  Rumination  Suicidal Thoughts:  No  Homicidal Thoughts:  No  Memory:  Immediate;   Fair  Judgement:  Fair  Insight:  Fair  Psychomotor Activity:  Decreased  Concentration:  Concentration: Fair  Recall:  Jennelle Human of Knowledge:Fair  Language: Fair  Akathisia:  No  Handed:    AIMS (if indicated):  not done  Assets:  Financial Resources/Insurance Housing Physical Health  ADL's:  Intact  Cognition: WNL  Sleep:   irregular at times   Screenings: GAD-7    Flowsheet Row Office Visit from 12/01/2021 in [facility] Primary Care & Sports Medicine at [facility] Office Visit from 05/19/2021 in [facility] Primary Care & Sports Medicine at [facility] Office Visit from 11/11/2020 in [facility] Primary Care & Sports Medicine at [facility] Clinical Support from 09/30/2020 in Center for [facility] Healthcare at [facility] Clinical Support from 09/12/2020 in Center for [facility] Healthcare at [facility]  Total GAD-7 Score 6 12 13  0 0      PHQ2-9    Flowsheet Row Office Visit from 12/01/2021 in [facility] Primary Care &  Sports Medicine  at [employer] Video Visit from 10/13/2021 in [facility] Outpatient Behavioral Health at [facility] Counselor from 07/09/2021 in [facility] Outpatient Behavioral Health at [facility] Office Visit from 05/19/2021 in [facility] Primary Care & Sports Medicine at [facility] Office Visit from 11/11/2020 in [facility] Primary Care & Sports Medicine at [facility]  PHQ-2 Total Score 3 2 3 2 3   PHQ-9 Total Score 13 10 18 9 16       Flowsheet Row ED from 09/10/2022 in [facility] Emergency Department at [facility] Video Visit from 01/12/2022 in [facility] Outpatient Behavioral Health at [facility] Video Visit from 10/13/2021 in [facility] Outpatient Behavioral Health at [facility]  C-SSRS RISK CATEGORY No Risk No Risk No Risk       Assessment and Plan: as follows  Prior documentation reviewed   MDD recurrent moderate:  stable continue wellbutrin  GAD: doing better continue zoloft   Insomnia: handling it fair, takes trazadone prn, continue sleep hygiene  Meds sent which were due Fu  6 m.    Collaboration of Care: Other primary care and counsellor notes and meds reviewed  Patient/Guardian was advised Release of Information must be obtained prior to any record release in order to collaborate their care with an outside provider. Patient/Guardian was advised if they have not already done so to contact the registration department to sign all necessary forms in order for [place] to release information regarding their care.   Consent: Patient/Guardian gives verbal consent for treatment and assignment of benefits for services provided during this visit. Patient/Guardian expressed understanding and agreed to proceed.   Thresa Ross, MD 4/7/20259:04 AM

## 2023-08-30 ENCOUNTER — Ambulatory Visit: Admitting: Obstetrics & Gynecology

## 2023-08-30 ENCOUNTER — Other Ambulatory Visit (HOSPITAL_COMMUNITY)
Admission: RE | Admit: 2023-08-30 | Discharge: 2023-08-30 | Disposition: A | Discharge status: Home / Self Care | Source: Ambulatory Visit | Attending: Obstetrics & Gynecology | Admitting: Obstetrics & Gynecology

## 2023-08-30 ENCOUNTER — Encounter: Payer: Self-pay | Admitting: Obstetrics & Gynecology

## 2023-08-30 VITALS — BP 119/79 | HR 75 | Ht 63.0 in | Wt 224.0 lb

## 2023-08-30 DIAGNOSIS — N930 Postcoital and contact bleeding: Secondary | ICD-10-CM | POA: Diagnosis not present

## 2023-08-30 DIAGNOSIS — Z1331 Encounter for screening for depression: Secondary | ICD-10-CM | POA: Diagnosis not present

## 2023-08-30 DIAGNOSIS — Z113 Encounter for screening for infections with a predominantly sexual mode of transmission: Secondary | ICD-10-CM | POA: Insufficient documentation

## 2023-08-30 DIAGNOSIS — Z01419 Encounter for gynecological examination (general) (routine) without abnormal findings: Secondary | ICD-10-CM | POA: Diagnosis not present

## 2023-08-30 NOTE — Progress Notes (Signed)
     Subjective:     Carol Wright is a 38 y.o. female here for a routine exam.  Current complaints: Spotting 1-2 days after vaginal intercourse--has monthly menses with IUD with no issues.     Gynecologic History Patient's last menstrual period was 08/13/2023 (exact date). Contraception: IUD Last pap smear (date and result):11/27/2019 WNL Last mammogram (date and result):not done. Last colon screening (date and result):n/a Brush:yes Floss:yes Seatbelts: yes Sunscreen: yes  Obstetric History OB History  Gravida Para Term Preterm AB Living  3 3 1 2  3   SAB IAB Ectopic Multiple Live Births     0 1    # Outcome Date GA Lbr Len/2nd Weight Sex Type Anes PTL Lv  3 Term 10/07/20 [redacted]w[redacted]d  9 lb 2.7 oz (4.16 kg) M CS-LTranv Spinal, EPI  LIV  2 Preterm      CS-LTranv     1 Preterm      CS-LTranv        The following portions of the patient's history were reviewed and updated as appropriate: allergies, current medications, past family history, past medical history, past social history, past surgical history, and problem list.  Review of Systems Pertinent items noted in HPI and remainder of comprehensive ROS otherwise negative.    Objective:     Vitals:   08/30/23 0934  BP: 119/79  Pulse: 75  Weight: 224 lb (101.6 kg)  Height: 5' 3 (1.6 m)   Vitals:  WNL General appearance: alert, cooperative and no distress  HEENT: Normocephalic, without obvious abnormality, atraumatic Eyes: negative Throat: lips, mucosa, and tongue normal; teeth and gums normal  Respiratory: Clear to auscultation bilaterally  CV: Regular rate and rhythm  Breasts:  Normal appearance, no masses or tenderness, no nipple retraction or dimpling  GI: Soft, non-tender; bowel sounds normal; no masses,  no organomegaly  GU: External Genitalia:  Tanner V, stable brown macules Urethra:  No prolapse   Vagina: Pink, normal rugae, no blood or discharge  Cervix: No CMT, no lesion  Uterus:  Normal size and contour,  non tender  Adnexa: Normal, no masses, non tender  Musculoskeletal: No edema, redness or tenderness in the calves or thighs  Skin: No lesions or rash  Lymphatic: Axillary adenopathy: none     Psychiatric: Normal mood and behavior        Assessment:    Healthy female exam.  Recent heart attack IUD x3 years Bleeding after intercourse--1-2 days every time.    Plan:   Pap with cultures and reflex HPV Mammogram Bleeding after intercourse--no ectropion, no bleeding after pap; will get US  to evaluate IUD and uterus.  STD testing per pt request Periodic self vulvar exams to keep check on brown macules.

## 2023-08-31 ENCOUNTER — Ambulatory Visit: Payer: Self-pay | Admitting: Obstetrics & Gynecology

## 2023-08-31 LAB — RPR+HBSAG+HCVAB+...
HIV Screen 4th Generation wRfx: NONREACTIVE
Hep C Virus Ab: NONREACTIVE
Hepatitis B Surface Ag: NEGATIVE
RPR Ser Ql: NONREACTIVE

## 2023-09-01 LAB — CYTOLOGY - PAP
Chlamydia: NEGATIVE
Comment: NEGATIVE
Comment: NEGATIVE
Comment: NORMAL
Diagnosis: NEGATIVE
High risk HPV: NEGATIVE
Neisseria Gonorrhea: NEGATIVE

## 2023-09-06 ENCOUNTER — Other Ambulatory Visit

## 2023-09-20 ENCOUNTER — Ambulatory Visit
Admission: RE | Admit: 2023-09-20 | Discharge: 2023-09-20 | Disposition: A | Discharge status: Home / Self Care | Source: Ambulatory Visit | Attending: Obstetrics & Gynecology | Admitting: Obstetrics & Gynecology

## 2023-09-20 DIAGNOSIS — Z01419 Encounter for gynecological examination (general) (routine) without abnormal findings: Secondary | ICD-10-CM

## 2023-09-27 ENCOUNTER — Ambulatory Visit (INDEPENDENT_AMBULATORY_CARE_PROVIDER_SITE_OTHER)

## 2023-09-27 DIAGNOSIS — N930 Postcoital and contact bleeding: Secondary | ICD-10-CM | POA: Diagnosis not present

## 2023-09-29 ENCOUNTER — Other Ambulatory Visit: Payer: Self-pay

## 2023-09-29 DIAGNOSIS — E282 Polycystic ovarian syndrome: Secondary | ICD-10-CM

## 2023-10-17 ENCOUNTER — Ambulatory Visit (HOSPITAL_BASED_OUTPATIENT_CLINIC_OR_DEPARTMENT_OTHER)

## 2023-10-24 ENCOUNTER — Ambulatory Visit (HOSPITAL_BASED_OUTPATIENT_CLINIC_OR_DEPARTMENT_OTHER)
Admission: RE | Admit: 2023-10-24 | Discharge: 2023-10-24 | Disposition: A | Discharge status: Home / Self Care | Source: Ambulatory Visit | Attending: Obstetrics & Gynecology | Admitting: Obstetrics & Gynecology

## 2023-10-24 DIAGNOSIS — E282 Polycystic ovarian syndrome: Secondary | ICD-10-CM | POA: Insufficient documentation

## 2023-10-24 MED ORDER — GADOBUTROL 1 MMOL/ML IV SOLN
10.0000 mL | Freq: Once | INTRAVENOUS | Status: AC | PRN
  Administered 2023-10-24: 10 mL via INTRAVENOUS

## 2023-10-28 ENCOUNTER — Ambulatory Visit: Payer: Self-pay | Admitting: Obstetrics and Gynecology

## 2023-12-20 ENCOUNTER — Telehealth (HOSPITAL_COMMUNITY): Admitting: Psychiatry

## 2024-01-26 ENCOUNTER — Ambulatory Visit

## 2024-01-26 VITALS — BP 138/87 | HR 72 | Ht 63.0 in | Wt 227.0 lb

## 2024-01-26 DIAGNOSIS — R300 Dysuria: Secondary | ICD-10-CM

## 2024-01-26 DIAGNOSIS — R8290 Unspecified abnormal findings in urine: Secondary | ICD-10-CM

## 2024-01-26 LAB — POCT URINALYSIS DIPSTICK
Bilirubin, UA: NEGATIVE
Glucose, UA: NEGATIVE
Ketones, UA: NEGATIVE
Leukocytes, UA: NEGATIVE
Nitrite, UA: POSITIVE
Protein, UA: POSITIVE — AB
Spec Grav, UA: >=1.03 — AB (ref 1.010–1.025)
Urobilinogen, UA: 0.2 U/dL
pH, UA: 6 (ref 5.0–8.0)

## 2024-01-26 MED ORDER — NITROFURANTOIN MONOHYD MACRO 100 MG PO CAPS: 100.0000 mg | ORAL_CAPSULE | Freq: Two times a day (BID) | ORAL | 0 refills | Status: AC

## 2024-01-26 NOTE — Progress Notes (Signed)
 SUBJECTIVE: Carol Wright is a 38 y.o. female who complains of urinary odor, nausea, urgency, chills and dysuria x 10 days, without flank pain, fever, or abnormal vaginal discharge or bleeding.   OBJECTIVE: Appears well, in no apparent distress.  Vital signs are normal. Urine dipstick shows positive for RBC's, positive for protein, and positive for nitrates.    ASSESSMENT: Dysuria, Urine Odor  PLAN: Treatment per orders.  Call or return to clinic prn if these symptoms worsen or fail to improve as anticipated. Macrobid ordered, urine culture sent.  Silvano LELON Piano, RN

## 2024-01-29 LAB — URINE CULTURE

## 2024-01-31 ENCOUNTER — Ambulatory Visit: Payer: Self-pay | Admitting: Obstetrics and Gynecology

## 2024-01-31 DIAGNOSIS — N3000 Acute cystitis without hematuria: Secondary | ICD-10-CM

## 2024-01-31 MED ORDER — SULFAMETHOXAZOLE-TRIMETHOPRIM 800-160 MG PO TABS: 1.0000 | ORAL_TABLET | Freq: Two times a day (BID) | ORAL | 0 refills | Status: AC
